# Patient Record
Sex: Female | Born: 1950 | Race: White | Hispanic: No | Smoking: Never smoker
Health system: Southern US, Community
[De-identification: ages and names within clinical notes are randomized; demographics above are authoritative.]

## PROBLEM LIST (undated history)

## (undated) DIAGNOSIS — M25473 Effusion, unspecified ankle: Secondary | ICD-10-CM

## (undated) DIAGNOSIS — F419 Anxiety disorder, unspecified: Secondary | ICD-10-CM

## (undated) DIAGNOSIS — M722 Plantar fascial fibromatosis: Secondary | ICD-10-CM

## (undated) DIAGNOSIS — L719 Rosacea, unspecified: Secondary | ICD-10-CM

## (undated) DIAGNOSIS — E119 Type 2 diabetes mellitus without complications: Secondary | ICD-10-CM

## (undated) DIAGNOSIS — M7989 Other specified soft tissue disorders: Secondary | ICD-10-CM

## (undated) DIAGNOSIS — I10 Essential (primary) hypertension: Secondary | ICD-10-CM

## (undated) DIAGNOSIS — I4891 Unspecified atrial fibrillation: Secondary | ICD-10-CM

## (undated) DIAGNOSIS — I82409 Acute embolism and thrombosis of unspecified deep veins of unspecified lower extremity: Secondary | ICD-10-CM

## (undated) DIAGNOSIS — C801 Malignant (primary) neoplasm, unspecified: Secondary | ICD-10-CM

## (undated) DIAGNOSIS — R7303 Prediabetes: Secondary | ICD-10-CM

## (undated) DIAGNOSIS — Z86711 Personal history of pulmonary embolism: Secondary | ICD-10-CM

## (undated) HISTORY — PX: ABDOMINAL HYSTERECTOMY: SHX81

## (undated) HISTORY — PX: FRACTURE SURGERY: SHX138

## (undated) HISTORY — PX: COLONOSCOPY: SHX174

## (undated) HISTORY — PX: ACHILLES TENDON SURGERY: SHX542

## (undated) HISTORY — PX: CHOLECYSTECTOMY: SHX55

---

## 1999-04-01 ENCOUNTER — Other Ambulatory Visit: Admission: RE | Admit: 1999-04-01 | Discharge: 1999-04-01 | Payer: Self-pay | Admitting: *Deleted

## 1999-04-10 ENCOUNTER — Encounter (INDEPENDENT_AMBULATORY_CARE_PROVIDER_SITE_OTHER): Payer: Self-pay | Admitting: Specialist

## 1999-04-10 ENCOUNTER — Other Ambulatory Visit: Admission: RE | Admit: 1999-04-10 | Discharge: 1999-04-10 | Payer: Self-pay | Admitting: *Deleted

## 2004-09-24 ENCOUNTER — Other Ambulatory Visit: Admission: RE | Admit: 2004-09-24 | Discharge: 2004-09-24 | Payer: Self-pay | Admitting: Obstetrics and Gynecology

## 2009-10-27 ENCOUNTER — Ambulatory Visit: Payer: Self-pay | Admitting: Vascular Surgery

## 2009-11-20 ENCOUNTER — Ambulatory Visit: Payer: Self-pay | Admitting: Vascular Surgery

## 2010-09-13 ENCOUNTER — Encounter: Payer: Self-pay | Admitting: Obstetrics and Gynecology

## 2011-01-05 NOTE — Procedures (Signed)
LOWER EXTREMITY VENOUS REFLUX EXAM   INDICATION:  Edema and spider veins.   EXAM:  Using color-flow imaging and pulse Doppler spectral analysis, the  bilateral common femoral, superficial femoral, popliteal, posterior  tibial, greater and lesser saphenous veins are evaluated.  There is no  evidence suggesting deep venous insufficiency in the bilateral lower  extremities.   The bilateral saphenofemoral junction is competent. The right GSV is not  competent with Reflux of >547milliseconds with the caliber as described  below.   The bilateral proximal short saphenous veins demonstrate competency.   GSV Diameter (used if found to be incompetent only)                                            Right       Left  Proximal Greater Saphenous Vein           0.43 cm     cm  Proximal-to-mid-thigh                     0.50 cm     cm  Mid thigh                                 0.41 cm     cm  Mid-distal thigh                          cm          cm  Distal thigh                              0.38 cm     cm  Knee                                      0.31 cm     cm   IMPRESSION:  1. Right greater saphenous vein Reflux with >538milliseconds is      identified with the caliber ranging from 0.31 cm to 0.43 cm knee to      groin.  2. The right greater saphenous vein is not aneurysmal.  3. The right greater saphenous vein is not tortuous.  4. The deep venous system is competent.  5. The bilateral lesser saphenous veins are competent.   ___________________________________________  Quita Skye. Hart Rochester, M.D.   CB/MEDQ  D:  10/27/2009  T:  10/28/2009  Job:  [id number]

## 2011-01-05 NOTE — Consult Note (Signed)
NEW PATIENT CONSULTATION   Stacie Marquez, Stacie Marquez  DOB:  September 28, 1950                                       10/27/2009  [id number]#:[id number]   Patient is a 60 year old female referred by [facility]  for painful venous disease in both lower extremities and swelling in the  left lower extremity.  She states that over the past 4-5 months, she has  developed some edema in the ankles, left worse than right.  She has  about a 3-4-year history of burning, tingling discomfort on the right  leg in the lateral knee and lateral calf area associated with an  enlarging pattern of prominent veins in these areas.  She also has some  prominent veins in the left leg but not to the same degree.  She had no  history of bleeding, ulcerations, thrombophlebitis, or deep vein  thrombosis.  She has never had treatment of these veins in the past.   CHRONIC MEDICAL PROBLEMS:  1. Hyperlipidemia.  2. Chronic foot pain.  3. Distal edema.  4. Negative for diabetes, hypertension, coronary artery disease, COPD,      or stroke.   PAST SURGICAL HISTORY:  1. Cesarean section.  2. Repair of right Achilles tendon.   FAMILY HISTORY:  Positive for diabetes, coronary artery disease, and  Alzheimer's in her mother.  Peripheral vascular disease in her father  and stroke at an elderly age.   SOCIAL HISTORY:  She is single, has 2 children, works as a Catering manager.  Does not use tobacco or alcohol.   REVIEW OF SYSTEMS:  Negative for anorexia, weight loss, chest pain,  dyspnea on exertion, PND, orthopnea, chronic cough, bronchitis, asthma,  wheezing.  Denies any GI or GU symptoms.  Is able to ambulate to several  blocks without symptoms.  Neurologic, musculoskeletal, all other the  review of systems are negative.   PHYSICAL EXAMINATION:  Blood pressure 142/80, heart rate 72,  respirations 20.  General:  She is a well-developed, well-nourished  female in no apparent distress.  HEENT:  EOMs intact.   Conjunctivae  normal.  Neck is supple, 3+ carotid pulses with no bruits.  Chest:  Clear to auscultation.  No wheezing.  Cardiovascular:  Regular rhythm.  No murmurs.  Abdomen:  Soft, nontender with no masses.  Musculoskeletal:  Free of deformities.  Neurologic:  Normal.  Skin:  Free of rashes.  Lower extremity exam reveals an extensive pattern of reticular and  spider veins in the right leg in the lateral and medial thigh with large  coalescing patterns up to 8 or 10 cm in diameter extending down into the  lateral calf area and a few at the ankle with 1+ edema.  The left leg  has much less in the way of spider and reticular veins in the same  distribution with edema slightly worse than the right.  She has 3+  dorsalis pedis pulses bilaterally.   Today I reviewed the medical information provided by the [facility] as well as I ordered a venous duplex exam in which I  reviewed and interpreted.  Left leg is normal.  Right leg has reflux in  the great saphenous vein, but the diameter of the vein is small, and the  deep system is normal.   I do not think she needs any treatment of her great saphenous vein,  as  it is a small vessel, although it does have some reflux.  I think she  would benefit from sclerotherapy of these extensive patterns of  reticular and spider veins.  We discussed this with her at length today,  including risks and benefits.  She will consider this and make a  decision regarding it.     Quita Skye Hart Rochester, M.D.  Electronically Signed   JDL/MEDQ  D:  10/27/2009  T:  10/27/2009  Job:  [id number]   cc:   Fara Chute

## 2012-05-29 ENCOUNTER — Other Ambulatory Visit: Payer: Self-pay | Admitting: Gastroenterology

## 2012-05-29 DIAGNOSIS — R1011 Right upper quadrant pain: Secondary | ICD-10-CM

## 2012-06-02 NOTE — Procedures (Signed)
°

## 2012-06-05 ENCOUNTER — Ambulatory Visit
Admission: RE | Admit: 2012-06-05 | Discharge: 2012-06-05 | Disposition: A | Discharge status: Home / Self Care | Payer: BC Managed Care – PPO | Source: Ambulatory Visit | Attending: Gastroenterology | Admitting: Gastroenterology

## 2012-06-05 ENCOUNTER — Other Ambulatory Visit: Payer: Self-pay | Admitting: Gastroenterology

## 2012-06-05 DIAGNOSIS — R1011 Right upper quadrant pain: Secondary | ICD-10-CM

## 2012-06-05 NOTE — Procedures (Signed)
°

## 2012-06-15 ENCOUNTER — Encounter (INDEPENDENT_AMBULATORY_CARE_PROVIDER_SITE_OTHER): Payer: Self-pay | Admitting: General Surgery

## 2012-06-15 ENCOUNTER — Ambulatory Visit (INDEPENDENT_AMBULATORY_CARE_PROVIDER_SITE_OTHER): Payer: BC Managed Care – PPO | Admitting: General Surgery

## 2012-06-15 VITALS — BP 119/83 | HR 85 | Temp 97.4°F | Resp 18 | Ht 64.0 in | Wt 237.8 lb

## 2012-06-15 DIAGNOSIS — R1011 Right upper quadrant pain: Secondary | ICD-10-CM

## 2012-06-15 MED ORDER — ESOMEPRAZOLE MAGNESIUM 20 MG PO CPDR: 20.0000 mg | DELAYED_RELEASE_CAPSULE | Freq: Every day | ORAL | Status: DC

## 2012-06-15 NOTE — Progress Notes (Signed)
Chief Complaint  Patient presents with   Abdominal Pain    gallbladder    HISTORY:  Stacie Marquez is a 61 y.o. female who presents to clinic with RUQ pain that is worse at night.  Associated symptoms include bloating.  The pain is worse after pizza, cabbage, beans and hamburgers.  She complains of early satiety.  She describes minimal nausea and no reflux- just pain.  The pain feels better when she puts pressure on the area or stands up.  It is worse when lying down.  History reviewed. No pertinent past medical history.     Past Surgical History  Procedure Date   Cesarean section    Achilles tendon surgery       Current Outpatient Prescriptions  Medication Sig Dispense Refill   diclofenac (VOLTAREN) 75 MG EC tablet Take 75 mg by mouth 2 (two) times daily.       doxycycline (VIBRAMYCIN) 100 MG capsule Take 100 mg by mouth daily.       esomeprazole (NEXIUM) 20 MG capsule Take 1 capsule (20 mg total) by mouth daily before breakfast.  30 capsule  2     No Known Allergies    History reviewed. No pertinent family history.    History   Social History   Marital Status: Single    Spouse Name: N/A    Number of Children: N/A   Years of Education: N/A   Social History Main Topics   Smoking status: Never Smoker    Smokeless tobacco: None   Alcohol Use: No   Drug Use: No   Sexually Active: None   Other Topics Concern   None   Social History Narrative   None       REVIEW OF SYSTEMS - PERTINENT POSITIVES ONLY: Review of Systems - General ROS: negative for - chills, fever, weight gain or weight loss Hematological and Lymphatic ROS: negative for - bleeding problems, blood clots or bruising Respiratory ROS: no cough, shortness of breath, or wheezing Cardiovascular ROS: no chest pain or dyspnea on exertion Gastrointestinal ROS: positive for - abdominal pain and gas/bloating negative for - blood in stools, change in stools or melena  EXAM: Filed Vitals:   06/15/12  1328  BP: 119/83  Pulse: 85  Temp: 97.4 F (36.3 C)  Resp: 18    General appearance: alert, cooperative and no distress Resp: clear to auscultation bilaterally Cardio: regular rate and rhythm GI: obese, soft, no masses Tender in RUQ.  Neg Holman's sign  LABORATORY RESULTS: Available labs are reviewed    RADIOLOGY RESULTS:   Images and reports are reviewed. IMPRESSION:  1. Cholelithiasis without sonographic findings for acute  cholecystitis.  2. Normal caliber common bile duct.  3. Diffuse fatty infiltration of the liver.    ASSESSMENT AND PLAN: Stacie Marquez is a 61 y.o. female who was referred to me for RUQ pain.  She has an [place] that shows cholelithiasis.  Her symptoms of bloating and pain after eating certain meals may be attributed to her gallbladder.  They could also be due to gastritis, so I will prescribe a short course of PPI while we schedule her for surgery.  She will also need medical clearance.  If the PPI were to completely eliminate her pain, we will postpone surgery.  She will need LFT's prior to her procedure as well.      Vanita Panda, MD Colon and Rectal Surgery / General Surgery [facility]      Visit  Diagnoses: 1. RUQ pain     Primary Care Physician: Estanislado Pandy, MD   Dr Andrey Campanile

## 2012-06-15 NOTE — Patient Instructions (Signed)
Start taking your Nexium daily for 2 weeks.  If your symptoms get better before your surgery, call the office and we will cancel.

## 2012-07-13 ENCOUNTER — Encounter (HOSPITAL_COMMUNITY): Payer: Self-pay | Admitting: Pharmacy Technician

## 2012-07-17 ENCOUNTER — Encounter (HOSPITAL_COMMUNITY)
Admission: RE | Admit: 2012-07-17 | Discharge: 2012-07-17 | Disposition: A | Discharge status: Home / Self Care | Payer: BC Managed Care – PPO | Source: Ambulatory Visit | Attending: General Surgery | Admitting: General Surgery

## 2012-07-17 ENCOUNTER — Encounter (HOSPITAL_COMMUNITY): Payer: Self-pay

## 2012-07-17 HISTORY — DX: Rosacea, unspecified: L71.9

## 2012-07-17 LAB — CBC
MCH: 29.6 pg (ref 26.0–34.0)
MCV: 85.9 fL (ref 78.0–100.0)
Platelets: 181 10*3/uL (ref 150–400)
RDW: 13.8 % (ref 11.5–15.5)

## 2012-07-17 NOTE — Pre-Procedure Instructions (Signed)
20 Mckenzy Salazar  07/17/2012   Your procedure is scheduled on:  Thursday December 5  Report to [facility] Stay [facility] at 8:00 AM.  Call this number if you have problems the morning of surgery: [phone]   Remember:   Do not eat or drink:After Midnight.    Take these medicines the morning of surgery with A SIP OF WATER: Doxycycline   Do not wear jewelry, make-up or nail polish.  Do not wear lotions, powders, or perfumes. You may wear deodorant.  Do not shave 48 hours prior to surgery. Men may shave face and neck.  Do not bring valuables to the hospital.  Contacts, dentures or bridgework may not be worn into surgery.  Leave suitcase in the car. After surgery it may be brought to your room.  For patients admitted to the hospital, checkout time is 11:00 AM the day of discharge.   Patients discharged the day of surgery will not be allowed to drive home.    Special Instructions: Shower using CHG 2 nights before surgery and the night before surgery.  If you shower the day of surgery use CHG.  Use special wash - you have one bottle of CHG for all showers.  You should use approximately 1/3 of the bottle for each shower.   Please read over the following fact sheets that you were given: Pain Booklet, Coughing and Deep Breathing and Surgical Site Infection Prevention

## 2012-07-18 NOTE — Procedures (Signed)
°

## 2012-07-26 MED ORDER — DEXTROSE 5 % IV SOLN
2.0000 g | INTRAVENOUS | Status: AC
  Administered 2012-07-27: 2 g via INTRAVENOUS
  Filled 2012-07-26: qty 2

## 2012-07-27 ENCOUNTER — Ambulatory Visit (HOSPITAL_COMMUNITY): Payer: BC Managed Care – PPO | Admitting: Anesthesiology

## 2012-07-27 ENCOUNTER — Encounter (HOSPITAL_COMMUNITY): Payer: Self-pay | Admitting: Anesthesiology

## 2012-07-27 ENCOUNTER — Encounter (HOSPITAL_COMMUNITY)
Admission: RE | Disposition: A | Discharge status: Home / Self Care | Payer: Self-pay | Source: Ambulatory Visit | Attending: General Surgery

## 2012-07-27 ENCOUNTER — Encounter (HOSPITAL_COMMUNITY): Payer: Self-pay | Admitting: *Deleted

## 2012-07-27 ENCOUNTER — Ambulatory Visit (HOSPITAL_COMMUNITY)
Admission: RE | Admit: 2012-07-27 | Discharge: 2012-07-27 | Disposition: A | Discharge status: Home / Self Care | Payer: BC Managed Care – PPO | Source: Ambulatory Visit | Attending: General Surgery | Admitting: General Surgery

## 2012-07-27 ENCOUNTER — Ambulatory Visit (HOSPITAL_COMMUNITY): Payer: BC Managed Care – PPO

## 2012-07-27 DIAGNOSIS — K8020 Calculus of gallbladder without cholecystitis without obstruction: Secondary | ICD-10-CM | POA: Insufficient documentation

## 2012-07-27 DIAGNOSIS — Z01812 Encounter for preprocedural laboratory examination: Secondary | ICD-10-CM | POA: Insufficient documentation

## 2012-07-27 DIAGNOSIS — K8010 Calculus of gallbladder with chronic cholecystitis without obstruction: Secondary | ICD-10-CM

## 2012-07-27 HISTORY — PX: CHOLECYSTECTOMY: SHX55

## 2012-07-27 LAB — HEPATIC FUNCTION PANEL
ALT: 21 U/L (ref 0–35)
AST: 19 U/L (ref 0–37)
Albumin: 3.4 g/dL — ABNORMAL LOW (ref 3.5–5.2)
Alkaline Phosphatase: 57 U/L (ref 39–117)
Total Protein: 6.4 g/dL (ref 6.0–8.3)

## 2012-07-27 SURGERY — LAPAROSCOPIC CHOLECYSTECTOMY WITH INTRAOPERATIVE CHOLANGIOGRAM
Anesthesia: General | Site: Abdomen | Wound class: Clean Contaminated

## 2012-07-27 MED ORDER — ONDANSETRON HCL 4 MG/2ML IJ SOLN
INTRAMUSCULAR | Status: DC | PRN
  Administered 2012-07-27: 4 mg via INTRAVENOUS

## 2012-07-27 MED ORDER — GLYCOPYRROLATE 0.2 MG/ML IJ SOLN
INTRAMUSCULAR | Status: DC | PRN
  Administered 2012-07-27: .6 mg via INTRAVENOUS

## 2012-07-27 MED ORDER — LACTATED RINGERS IV SOLN
INTRAVENOUS | Status: DC | PRN
  Administered 2012-07-27 (×2): via INTRAVENOUS

## 2012-07-27 MED ORDER — 0.9 % SODIUM CHLORIDE (POUR BTL) OPTIME
TOPICAL | Status: DC | PRN
  Administered 2012-07-27: 1000 mL

## 2012-07-27 MED ORDER — HYDROMORPHONE HCL PF 1 MG/ML IJ SOLN
0.2500 mg | INTRAMUSCULAR | Status: DC | PRN
  Administered 2012-07-27: 0.5 mg via INTRAVENOUS

## 2012-07-27 MED ORDER — MIDAZOLAM HCL 5 MG/5ML IJ SOLN
INTRAMUSCULAR | Status: DC | PRN
  Administered 2012-07-27: 2 mg via INTRAVENOUS

## 2012-07-27 MED ORDER — HYDROMORPHONE HCL PF 1 MG/ML IJ SOLN
INTRAMUSCULAR | Status: AC
  Filled 2012-07-27: qty 1

## 2012-07-27 MED ORDER — ROCURONIUM BROMIDE 100 MG/10ML IV SOLN
INTRAVENOUS | Status: DC | PRN
  Administered 2012-07-27: 50 mg via INTRAVENOUS

## 2012-07-27 MED ORDER — NEOSTIGMINE METHYLSULFATE 1 MG/ML IJ SOLN
INTRAMUSCULAR | Status: DC | PRN
  Administered 2012-07-27: 4 mg via INTRAVENOUS

## 2012-07-27 MED ORDER — OXYCODONE-ACETAMINOPHEN 5-325 MG PO TABS: 1.0000 | ORAL_TABLET | ORAL | Status: DC | PRN

## 2012-07-27 MED ORDER — DEXAMETHASONE SODIUM PHOSPHATE 4 MG/ML IJ SOLN
INTRAMUSCULAR | Status: DC | PRN
  Administered 2012-07-27: 4 mg via INTRAVENOUS

## 2012-07-27 MED ORDER — BUPIVACAINE-EPINEPHRINE 0.25% -1:200000 IJ SOLN
INTRAMUSCULAR | Status: DC | PRN
  Administered 2012-07-27: 16 mL

## 2012-07-27 MED ORDER — FENTANYL CITRATE 0.05 MG/ML IJ SOLN
INTRAMUSCULAR | Status: DC | PRN
  Administered 2012-07-27 (×2): 50 ug via INTRAVENOUS

## 2012-07-27 MED ORDER — SODIUM CHLORIDE 0.9 % IR SOLN
Status: DC | PRN
  Administered 2012-07-27: 1000 mL

## 2012-07-27 MED ORDER — OXYCODONE HCL 5 MG PO TABS: 5.0000 mg | ORAL_TABLET | Freq: Once | ORAL | Status: DC | PRN

## 2012-07-27 MED ORDER — EPHEDRINE SULFATE 50 MG/ML IJ SOLN
INTRAMUSCULAR | Status: DC | PRN
  Administered 2012-07-27: 15 mg via INTRAVENOUS

## 2012-07-27 MED ORDER — OXYCODONE HCL 5 MG/5ML PO SOLN: 5.0000 mg | Freq: Once | ORAL | Status: DC | PRN

## 2012-07-27 MED ORDER — LACTATED RINGERS IV SOLN
INTRAVENOUS | Status: DC
  Administered 2012-07-27: 10:00:00 via INTRAVENOUS

## 2012-07-27 MED ORDER — SODIUM CHLORIDE 0.9 % IV SOLN
INTRAVENOUS | Status: DC | PRN
  Administered 2012-07-27: 11:00:00

## 2012-07-27 MED ORDER — LIDOCAINE HCL (CARDIAC) 20 MG/ML IV SOLN
INTRAVENOUS | Status: DC | PRN
  Administered 2012-07-27: 100 mg via INTRAVENOUS

## 2012-07-27 MED ORDER — PROPOFOL 10 MG/ML IV BOLUS
INTRAVENOUS | Status: DC | PRN
  Administered 2012-07-27: 180 mg via INTRAVENOUS

## 2012-07-27 MED ORDER — ONDANSETRON HCL 4 MG/2ML IJ SOLN: 4.0000 mg | Freq: Four times a day (QID) | INTRAMUSCULAR | Status: DC | PRN

## 2012-07-27 MED ORDER — BUPIVACAINE-EPINEPHRINE PF 0.25-1:200000 % IJ SOLN
INTRAMUSCULAR | Status: AC
  Filled 2012-07-27: qty 30

## 2012-07-27 SURGICAL SUPPLY — 50 items
ADH SKN CLS APL DERMABOND .7 (GAUZE/BANDAGES/DRESSINGS) ×1
APPLIER CLIP 5 13 M/L LIGAMAX5 (MISCELLANEOUS) ×2
APR CLP MED LRG 5 ANG JAW (MISCELLANEOUS) ×1
BAG SPEC RTRVL LRG 6X4 10 (ENDOMECHANICALS) ×1
CANISTER SUCTION 2500CC (MISCELLANEOUS) ×2 IMPLANT
CATH REDDICK CHOLANGI 4FR 50CM (CATHETERS) ×2 IMPLANT
CHLORAPREP W/TINT 26ML (MISCELLANEOUS) ×2 IMPLANT
CLIP APPLIE 5 13 M/L LIGAMAX5 (MISCELLANEOUS) ×1 IMPLANT
CLOTH BEACON ORANGE TIMEOUT ST (SAFETY) ×2 IMPLANT
COVER MAYO STAND STRL (DRAPES) ×2 IMPLANT
COVER SURGICAL LIGHT HANDLE (MISCELLANEOUS) ×2 IMPLANT
DECANTER SPIKE VIAL GLASS SM (MISCELLANEOUS) ×4 IMPLANT
DERMABOND ADVANCED (GAUZE/BANDAGES/DRESSINGS) ×1
DERMABOND ADVANCED .7 DNX12 (GAUZE/BANDAGES/DRESSINGS) ×1 IMPLANT
DRAPE C-ARM 42X72 X-RAY (DRAPES) ×2 IMPLANT
DRAPE UTILITY 15X26 W/TAPE STR (DRAPE) ×4 IMPLANT
ELECT REM PT RETURN 9FT ADLT (ELECTROSURGICAL) ×2
ELECTRODE REM PT RTRN 9FT ADLT (ELECTROSURGICAL) ×1 IMPLANT
GLOVE BIO SURGEON STRL SZ 6.5 (GLOVE) ×2 IMPLANT
GLOVE BIO SURGEON STRL SZ7 (GLOVE) ×2 IMPLANT
GLOVE BIO SURGEON STRL SZ7.5 (GLOVE) ×2 IMPLANT
GLOVE BIOGEL PI IND STRL 7.0 (GLOVE) ×3 IMPLANT
GLOVE BIOGEL PI IND STRL 7.5 (GLOVE) ×1 IMPLANT
GLOVE BIOGEL PI IND STRL 8 (GLOVE) ×1 IMPLANT
GLOVE BIOGEL PI INDICATOR 7.0 (GLOVE) ×3
GLOVE BIOGEL PI INDICATOR 7.5 (GLOVE) ×1
GLOVE BIOGEL PI INDICATOR 8 (GLOVE) ×1
GLOVE SS BIOGEL STRL SZ 6.5 (GLOVE) ×1 IMPLANT
GLOVE SUPERSENSE BIOGEL SZ 6.5 (GLOVE) ×1
GOWN PREVENTION PLUS XXLARGE (GOWN DISPOSABLE) ×2 IMPLANT
GOWN STRL NON-REIN LRG LVL3 (GOWN DISPOSABLE) ×6 IMPLANT
IV CATH 14GX2 1/4 (CATHETERS) ×2 IMPLANT
KIT BASIN OR (CUSTOM PROCEDURE TRAY) ×2 IMPLANT
KIT ROOM TURNOVER OR (KITS) ×2 IMPLANT
NS IRRIG 1000ML POUR BTL (IV SOLUTION) ×2 IMPLANT
PAD ARMBOARD 7.5X6 YLW CONV (MISCELLANEOUS) ×2 IMPLANT
POUCH SPECIMEN RETRIEVAL 10MM (ENDOMECHANICALS) ×2 IMPLANT
SCISSORS LAP 5X35 DISP (ENDOMECHANICALS) ×2 IMPLANT
SET IRRIG TUBING LAPAROSCOPIC (IRRIGATION / IRRIGATOR) ×2 IMPLANT
SLEEVE ENDOPATH XCEL 5M (ENDOMECHANICALS) ×4 IMPLANT
SPECIMEN JAR SMALL (MISCELLANEOUS) ×2 IMPLANT
SUT VIC AB 2-0 SH 27 (SUTURE) ×2
SUT VIC AB 2-0 SH 27XBRD (SUTURE) ×1 IMPLANT
SUT VIC AB 4-0 PS2 27 (SUTURE) ×2 IMPLANT
SUT VICRYL 0 UR6 27IN ABS (SUTURE) ×2 IMPLANT
TOWEL OR 17X24 6PK STRL BLUE (TOWEL DISPOSABLE) ×2 IMPLANT
TOWEL OR 17X26 10 PK STRL BLUE (TOWEL DISPOSABLE) ×2 IMPLANT
TRAY LAPAROSCOPIC (CUSTOM PROCEDURE TRAY) ×2 IMPLANT
TROCAR XCEL BLUNT TIP 100MML (ENDOMECHANICALS) ×2 IMPLANT
TROCAR XCEL NON-BLD 5MMX100MML (ENDOMECHANICALS) ×2 IMPLANT

## 2012-07-27 NOTE — Op Note (Signed)
07/27/2012  11:30 AM  PATIENT:  Stacie Marquez  61 y.o. female  Patient Care Team: Estanislado Pandy, MD as PCP - General (Cardiology)  PRE-OPERATIVE DIAGNOSIS:  Symptomatic cholelithiasis  POST-OPERATIVE DIAGNOSIS:  symptomatic cholelithiasis  PROCEDURE:  Procedure(s): LAPAROSCOPIC CHOLECYSTECTOMY WITH INTRAOPERATIVE CHOLANGIOGRAM  SURGEON:  Surgeon(s): Romie Levee, MD Wilmon Arms. Corliss Skains, MD  ASSISTANT: Tsuei   ANESTHESIA:   local and general  EBL: 20ml Total I/O In: 1200 [I.V.:1200] Out: -   SPECIMEN:  Source of Specimen:  Gallbladder  DISPOSITION OF SPECIMEN:  PATHOLOGY  COUNTS:  YES  PLAN OF CARE: Discharge to home after PACU  PATIENT DISPOSITION:  PACU - hemodynamically stable.  INDICATION: This is a 61yo F with symptomatic cholithiasis.  The anatomy & physiology of hepatobiliary & pancreatic function was discussed.  The pathophysiology of gallbladder dysfunction was discussed.  Natural history risks without surgery was discussed.   I feel the risks of no intervention will lead to serious problems that outweigh the operative risks; therefore, I recommended cholecystectomy to remove the pathology.  I explained laparoscopic techniques with possible need for an open approach.  Probable cholangiogram to evaluate the bilary tract was explained as well.    Risks such as bleeding, infection, abscess, leak, injury to other organs, need for further treatment, heart attack, death, and other risks were discussed.  I noted a good likelihood this will help address the problem.  Possibility that this will not correct all abdominal symptoms was explained.  Goals of post-operative recovery were discussed as well.    OR FINDINGS: distended Gallbladder  DESCRIPTION:   The patient was identified & brought into the operating room. The patient was positioned supine with arms tucked. SCDs were active during the entire case. The patient underwent general anesthesia without any difficulty.  The  abdomen was prepped and draped in a sterile fashion. A Surgical Timeout was performed and confirmed our plan.  We positioned the patient in reverse [place] & right side up.  I placed a Hassan laparoscopic port through the umbilicus using open entry technique.  Entry was clean. There were no adhesions to the anterior abdominal wall supraumbilically.  We induced carbon dioxide insufflation. Camera inspection revealed no injury.    I proceeded to continue with laparoscopic technique. I placed a #5 port in mid subcostal region, another 5mm port in the right flank near the anterior axillary line, and a 5mm port in the left subxiphoid region obliquely within the falciform ligament.  I turned attention to the right upper quadrant.  The duodenum was slightly adherent to the neck of the gallbladder.  This was taken down sharply.  The gallbladder fundus was elevated cephalad. I used cautery and blunt dissection to free the peritoneal coverings between the gallbladder and the liver on the posteriolateral and anteriomedial walls.   I used careful blunt and cautery dissection with a maryland dissector to help get a good critical view of the cystic artery and cystic duct. I did further dissection to free a few centimeters of the  gallbladder off the liver bed to get a good critical view of the infundibulum and cystic duct. I mobilized the cystic artery.  I skeletonized the cystic duct.  After getting a good 360 view, I decided to perform a cholangiogram.  I placed a clip on the infundibulum.   I did a partial cystic duct-otomy and ensured patency. I placed a 5 [place] Reddick balloon cholangiocatheter through a puncture site at the right subcostal ridge of the abdominal  wall and directed it into the cystic duct.  We ran a cholangiogram with dilute radio-opaque contrast and continuous fluoroscopy.  Contrast flowed from a side branch consistent with cystic duct cannulization. Contrast flowed up the common hepatic duct  into the right and left intrahepatic chains out to secondary radicals. Contrast flowed down the common bile duct easily across the normal ampulla into the duodenum.  This was consistent with a normal cholangiogram.  I removed the cholangiocatheter.  I placed clips on the cystic duct x3.  I completed cystic duct transection.   I placed clips on the cystic artery x3 with 2 proximally.  I ligated the cystic artery using scissors. I freed the gallbladder from its remaining attachments to the liver. I ensured hemostasis on the gallbladder fossa of the liver and elsewhere. I inspected the rest of the abdomen & detected no injury nor bleeding elsewhere.  I irrigated the RUQ with normal saline.  I removed the gallbladder through the umbilical port site.  I closed the umbilical fascia using 0 Vicryl stitches x2.   I closed the skin using 4-0 vicryl stitch.  Sterile dressings were applied. The patient was extubated & arrived in the PACU in stable condition.  I had discussed postoperative care with the patient in the holding area.   I will discuss  operative findings and postoperative goals / instructions with the patient's family.  Instructions are written in the chart as well.

## 2012-07-27 NOTE — Progress Notes (Signed)
1610  Hepatic function sample sent to the lab STAT as per requested by surgeon.Marland KitchenMarland KitchenDA

## 2012-07-27 NOTE — Anesthesia Postprocedure Evaluation (Signed)
Anesthesia Post Note  Patient: Stacie Marquez  Procedure(s) Performed: Procedure(s) (LRB): LAPAROSCOPIC CHOLECYSTECTOMY WITH INTRAOPERATIVE CHOLANGIOGRAM (N/A)  Anesthesia type: General  Patient location: PACU  Post pain: Pain level controlled and Adequate analgesia  Post assessment: Post-op Vital signs reviewed, Patient's Cardiovascular Status Stable, Respiratory Function Stable, Patent Airway and Pain level controlled  Last Vitals:  Filed Vitals:   07/27/12 1345  BP:   Pulse:   Temp: 36.8 C  Resp:     Post vital signs: Reviewed and stable  Level of consciousness: awake, alert  and oriented  Complications: No apparent anesthesia complications

## 2012-07-27 NOTE — H&P (Signed)
Chief Complaint   Patient presents with     Abdominal Pain     gallbladder   HISTORY: Stacie Marquez is a 61 y.o. female who presents to clinic with RUQ pain that is worse at night. Associated symptoms include bloating. The pain is worse after pizza, cabbage, beans and hamburgers. She complains of early satiety. She describes minimal nausea and no reflux- just pain. The pain feels better when she puts pressure on the area or stands up. It is worse when lying down.  History reviewed. No pertinent past medical history.  Past Surgical History   Procedure  Date     Cesarean section      Achilles tendon surgery     Current Outpatient Prescriptions   Medication  Sig  Dispense  Refill     diclofenac (VOLTAREN) 75 MG EC tablet  Take 75 mg by mouth 2 (two) times daily.       doxycycline (VIBRAMYCIN) 100 MG capsule  Take 100 mg by mouth daily.       esomeprazole (NEXIUM) 20 MG capsule  Take 1 capsule (20 mg total) by mouth daily before breakfast.  30 capsule  2   No Known Allergies  History reviewed. No pertinent family history.  History    Social History     Marital Status:  Single     Spouse Name:  N/A     Number of Children:  N/A     Years of Education:  N/A    Social History Main Topics     Smoking status:  Never Smoker     Smokeless tobacco:  None     Alcohol Use:  No     Drug Use:  No     Sexually Active:  None    Other Topics  Concern     None    Social History Narrative     None    REVIEW OF SYSTEMS - PERTINENT POSITIVES ONLY:  Review of Systems - General ROS: negative for - chills, fever, weight gain or weight loss  Hematological and Lymphatic ROS: negative for - bleeding problems, blood clots or bruising  Respiratory ROS: no cough, shortness of breath, or wheezing  Cardiovascular ROS: no chest pain or dyspnea on exertion  Gastrointestinal ROS: positive for - abdominal pain and gas/bloating  negative for - blood in stools, change in stools or melena    EXAM:  Filed Vitals:   07/27/12 0758  BP: 148/85  Pulse: 52  Temp: 98.5 F (36.9 C)  Resp: 18   General appearance: alert, cooperative and no distress  Resp: clear to auscultation bilaterally  Cardio: regular rate and rhythm  GI: obese, soft, no masses  Tender in RUQ.    LABORATORY RESULTS:  Available labs are reviewed  Lab Results  Component Value Date   WBC 4.8 07/17/2012   HGB 14.5 07/17/2012   HCT 42.1 07/17/2012   MCV 85.9 07/17/2012   PLT 181 07/17/2012   LFT's pending  RADIOLOGY RESULTS:  Images and reports are reviewed.  RUQ US IMPRESSION:  1. Cholelithiasis without sonographic findings for acute cholecystitis.  2. Normal caliber common bile duct.  3. Diffuse fatty infiltration of the liver.   ASSESSMENT AND PLAN:  Stacie Marquez is a 61 y.o. female who was referred to me for RUQ pain. She has an [place] that shows cholelithiasis. Her symptoms of bloating and pain after eating certain meals may be attributed to her gallbladder.  To OR for lap cholecystectomy and IOC.  The anatomy & physiology of hepatobiliary & pancreatic function was discussed.  The pathophysiology of gallbladder dysfunction was discussed.  Natural history risks without surgery was discussed.   I feel the risks of no intervention will lead to serious problems that outweigh the operative risks; therefore, I recommended cholecystectomy to remove the pathology.  I explained laparoscopic techniques with possible need for an open approach.  Probable cholangiogram to evaluate the bilary tract was explained as well.    Risks such as bleeding, infection, abscess, leak, injury to other organs, need for further treatment, heart attack, death, and other risks were discussed.  I noted a good likelihood this will help address the problem.  Possibility that this will not correct all abdominal symptoms was explained.  Goals of post-operative recovery were discussed as well.  We will work to minimize complications.  An  educational handout further explaining the pathology and treatment options was given as well.  Questions were answered.  The patient expresses understanding & wishes to proceed with surgery.   Vanita Panda, MD  Colon and Rectal Surgery / General Surgery  [facility]

## 2012-07-27 NOTE — Progress Notes (Addendum)
OOB TO BR UPON ARRIVAL TO SHORT STAY.  VOIDED WITHOUT  PROBLEMS.  ALL WOUNDS CD AND I WITH SKIN GLUE TO COVER X 4 WOUNDS.   TAKING PO FLUIDS.    TAKING LIQUIDS AND CRACKERS PO .Marland KitchenWITHOUT NAUSEA .Marland Kitchen SLEEPING .Marland Kitchen FAMILY AT THE BEDSIDE.

## 2012-07-27 NOTE — Anesthesia Preprocedure Evaluation (Signed)
Anesthesia Evaluation  Patient identified by MRN, date of birth, ID band Patient awake    Reviewed: Allergy & Precautions, H&P , NPO status , Patient's Chart, lab work & pertinent test results  Airway Mallampati: II  Neck ROM: full    Dental   Pulmonary          Cardiovascular     Neuro/Psych    GI/Hepatic   Endo/Other  Morbid obesity  Renal/GU      Musculoskeletal   Abdominal   Peds  Hematology   Anesthesia Other Findings   Reproductive/Obstetrics                           Anesthesia Physical Anesthesia Plan  ASA: II  Anesthesia Plan: General   Post-op Pain Management:    Induction: Intravenous  Airway Management Planned: Oral ETT  Additional Equipment:   Intra-op Plan:   Post-operative Plan: Extubation in OR  Informed Consent: I have reviewed the patients History and Physical, chart, labs and discussed the procedure including the risks, benefits and alternatives for the proposed anesthesia with the patient or authorized representative who has indicated his/her understanding and acceptance.     Plan Discussed with: CRNA and Surgeon  Anesthesia Plan Comments:         Anesthesia Quick Evaluation

## 2012-07-27 NOTE — Transfer of Care (Signed)
Immediate Anesthesia Transfer of Care Note  Patient: Stacie Marquez  Procedure(s) Performed: Procedure(s) (LRB) with comments: LAPAROSCOPIC CHOLECYSTECTOMY WITH INTRAOPERATIVE CHOLANGIOGRAM (N/A)  Patient Location: PACU  Anesthesia Type:General  Level of Consciousness: awake, alert  and oriented  Airway & Oxygen Therapy: Patient Spontanous Breathing and Patient connected to nasal cannula oxygen  Post-op Assessment: Report given to PACU RN, Post -op Vital signs reviewed and stable and Patient moving all extremities X 4  Post vital signs: Reviewed and stable  Complications: No apparent anesthesia complications

## 2012-07-28 ENCOUNTER — Encounter (HOSPITAL_COMMUNITY): Payer: Self-pay | Admitting: General Surgery

## 2012-07-28 NOTE — Procedures (Signed)
°

## 2012-07-29 NOTE — Consent Form (Signed)
°

## 2012-07-29 NOTE — Patient Instructions (Signed)
°

## 2012-07-29 NOTE — Patient Instructions (Deleted)
°

## 2012-08-03 ENCOUNTER — Telehealth (INDEPENDENT_AMBULATORY_CARE_PROVIDER_SITE_OTHER): Payer: Self-pay

## 2012-08-03 NOTE — Telephone Encounter (Signed)
Message copied by Ivory Broad on Thu Aug 03, 2012  9:14 AM ------      Message from: Stacie Marquez      Created: Thu Aug 03, 2012  8:33 AM       Pt would like a call back..she wants to know if she has to wait until her po apt before she can go back to work her # is [phone]

## 2012-08-03 NOTE — Telephone Encounter (Signed)
I called the pt back.  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  I told her it is fine for her to go back now.  She does not need a note because she can use her sick days if she goes back now.  She asked about a hernia repair that Dr Maisie Fus did during surgery.  She said she hadn't talked about it.  I read the op note and told her I saw no record of it.  They can talk about it at her postop visit.

## 2012-08-14 ENCOUNTER — Ambulatory Visit (INDEPENDENT_AMBULATORY_CARE_PROVIDER_SITE_OTHER): Payer: BC Managed Care – PPO | Admitting: General Surgery

## 2012-08-14 ENCOUNTER — Encounter (INDEPENDENT_AMBULATORY_CARE_PROVIDER_SITE_OTHER): Payer: Self-pay | Admitting: General Surgery

## 2012-08-14 VITALS — BP 134/76 | HR 76 | Temp 97.8°F | Resp 18 | Ht 66.0 in | Wt 235.0 lb

## 2012-08-14 DIAGNOSIS — Z9089 Acquired absence of other organs: Secondary | ICD-10-CM

## 2012-08-14 DIAGNOSIS — Z9049 Acquired absence of other specified parts of digestive tract: Secondary | ICD-10-CM

## 2012-08-14 NOTE — Patient Instructions (Signed)
Follow up as needed  No heavy lifting for 8 weeks after surgery

## 2012-08-14 NOTE — Progress Notes (Signed)
Stacie Marquez is a 61 y.o. female who is 2 weeks status post a cholecystectomy.  She is doing well.  Her pain is better.  She was having some lower back pain but that has improved. Objective: Filed Vitals:   08/14/12 1439  BP: 134/76  Pulse: 76  Temp: 97.8 F (36.6 C)  Resp: 18    General appearance: alert and cooperative GI: normal findings: soft, non-tender  Incision: healing well   Assessment: s/p  There is no problem list on file for this patient.   Doing well  Plan: No heavy lifting for 6 more weeks Return to office PRN   .Vanita Panda, MD [facility], [place] [phone]   08/14/2012 3:27 PM

## 2012-08-29 ENCOUNTER — Encounter (INDEPENDENT_AMBULATORY_CARE_PROVIDER_SITE_OTHER): Payer: Self-pay | Admitting: General Surgery

## 2012-08-29 ENCOUNTER — Ambulatory Visit (INDEPENDENT_AMBULATORY_CARE_PROVIDER_SITE_OTHER): Payer: BC Managed Care – PPO | Admitting: General Surgery

## 2012-08-29 VITALS — BP 120/82 | HR 60 | Temp 97.8°F | Resp 12 | Ht 64.0 in | Wt 237.0 lb

## 2012-08-29 DIAGNOSIS — T8140XA Infection following a procedure, unspecified, initial encounter: Secondary | ICD-10-CM

## 2012-08-29 DIAGNOSIS — T8141XA Infection following a procedure, superficial incisional surgical site, initial encounter: Secondary | ICD-10-CM

## 2012-08-29 NOTE — Progress Notes (Signed)
Stacie Marquez is a 62 y.o. female who is status post a lap chole in early Dec.  She returns with increased draina  Objective: Filed Vitals:   08/29/12 1207  BP: 120/82  Pulse: 60  Temp: 97.8 F (36.6 C)  Resp: 12    General appearance: alert and cooperative GI: normal findings: soft, non-tender  Incision: erythema at the superior aspect of incision with thick drainage  Area infused with lidocaine and skin opened with 11 blade.  Stitch knot removed.  Area covered with clean dressing Assessment: s/p  There is no problem list on file for this patient.   Stitch abscess  Plan: Clean and cover daily.  RTO in 2 weeks    .Vanita Panda, MD [facility], [place] [phone]   08/29/2012 12:30 PM

## 2012-08-29 NOTE — Patient Instructions (Signed)
Keep the area clean and covered until the drainage has stopped.

## 2012-09-13 ENCOUNTER — Encounter (INDEPENDENT_AMBULATORY_CARE_PROVIDER_SITE_OTHER): Payer: Self-pay | Admitting: General Surgery

## 2012-09-13 ENCOUNTER — Ambulatory Visit (INDEPENDENT_AMBULATORY_CARE_PROVIDER_SITE_OTHER): Payer: BC Managed Care – PPO | Admitting: General Surgery

## 2012-09-13 VITALS — BP 138/82 | HR 74 | Temp 97.8°F | Resp 16 | Ht 64.0 in | Wt 235.4 lb

## 2012-09-13 DIAGNOSIS — Z5189 Encounter for other specified aftercare: Secondary | ICD-10-CM

## 2012-09-13 NOTE — Progress Notes (Signed)
Stacie Marquez is a 62 y.o. female who is status post a lap chole.  We opened a small stitch abscess about 2 weeks ago.  The area has healed and is not draining anymore.  She has min pain  Objective: Filed Vitals:   09/13/12 1521  BP: 138/82  Pulse: 74  Temp: 97.8 F (36.6 C)  Resp: 16    General appearance: alert and cooperative  Incision: healing well   Assessment: s/p  There is no problem list on file for this patient.  Doing well.    Plan: F/U PRN    .Vanita Panda, MD [facility], [place] [phone]   09/13/2012 3:37 PM

## 2012-09-13 NOTE — Patient Instructions (Signed)
Return to the office as needed

## 2013-06-13 ENCOUNTER — Other Ambulatory Visit: Payer: Self-pay | Admitting: Obstetrics and Gynecology

## 2013-06-19 ENCOUNTER — Other Ambulatory Visit: Payer: Self-pay | Admitting: Obstetrics and Gynecology

## 2013-06-19 DIAGNOSIS — R928 Other abnormal and inconclusive findings on diagnostic imaging of breast: Secondary | ICD-10-CM

## 2013-06-21 NOTE — Procedures (Signed)
°

## 2013-07-09 ENCOUNTER — Ambulatory Visit
Admission: RE | Admit: 2013-07-09 | Discharge: 2013-07-09 | Disposition: A | Discharge status: Home / Self Care | Payer: BC Managed Care – PPO | Source: Ambulatory Visit | Attending: Obstetrics and Gynecology | Admitting: Obstetrics and Gynecology

## 2013-07-09 DIAGNOSIS — R928 Other abnormal and inconclusive findings on diagnostic imaging of breast: Secondary | ICD-10-CM

## 2014-06-08 ENCOUNTER — Emergency Department (HOSPITAL_COMMUNITY)
Admission: EM | Admit: 2014-06-08 | Discharge: 2014-06-08 | Disposition: A | Discharge status: Home / Self Care | Payer: BC Managed Care – PPO | Attending: Emergency Medicine | Admitting: Emergency Medicine

## 2014-06-08 ENCOUNTER — Encounter (HOSPITAL_COMMUNITY): Payer: Self-pay | Admitting: Emergency Medicine

## 2014-06-08 ENCOUNTER — Emergency Department (HOSPITAL_COMMUNITY): Payer: BC Managed Care – PPO

## 2014-06-08 DIAGNOSIS — Y92099 Unspecified place in other non-institutional residence as the place of occurrence of the external cause: Secondary | ICD-10-CM | POA: Diagnosis not present

## 2014-06-08 DIAGNOSIS — Z880 Allergy status to penicillin: Secondary | ICD-10-CM | POA: Insufficient documentation

## 2014-06-08 DIAGNOSIS — Z791 Long term (current) use of non-steroidal anti-inflammatories (NSAID): Secondary | ICD-10-CM | POA: Diagnosis not present

## 2014-06-08 DIAGNOSIS — Y9389 Activity, other specified: Secondary | ICD-10-CM | POA: Diagnosis not present

## 2014-06-08 DIAGNOSIS — W1830XA Fall on same level, unspecified, initial encounter: Secondary | ICD-10-CM | POA: Diagnosis not present

## 2014-06-08 DIAGNOSIS — Z872 Personal history of diseases of the skin and subcutaneous tissue: Secondary | ICD-10-CM | POA: Insufficient documentation

## 2014-06-08 DIAGNOSIS — S42262A Displaced fracture of lesser tuberosity of left humerus, initial encounter for closed fracture: Secondary | ICD-10-CM | POA: Diagnosis not present

## 2014-06-08 DIAGNOSIS — Z792 Long term (current) use of antibiotics: Secondary | ICD-10-CM | POA: Diagnosis not present

## 2014-06-08 DIAGNOSIS — S42202A Unspecified fracture of upper end of left humerus, initial encounter for closed fracture: Secondary | ICD-10-CM

## 2014-06-08 DIAGNOSIS — S42212A Unspecified displaced fracture of surgical neck of left humerus, initial encounter for closed fracture: Secondary | ICD-10-CM | POA: Insufficient documentation

## 2014-06-08 DIAGNOSIS — W19XXXA Unspecified fall, initial encounter: Secondary | ICD-10-CM

## 2014-06-08 DIAGNOSIS — S4992XA Unspecified injury of left shoulder and upper arm, initial encounter: Secondary | ICD-10-CM | POA: Diagnosis present

## 2014-06-08 MED ORDER — HYDROCODONE-ACETAMINOPHEN 5-325 MG PO TABS: 1.0000 | ORAL_TABLET | ORAL | Status: DC | PRN

## 2014-06-08 NOTE — ED Provider Notes (Signed)
CSN: [id number]     Arrival date & time 06/08/14  2106 History  This chart was scribed for Orpah Greek, * by Jeanell Sparrow, ED Scribe. This patient was seen in room APA07/APA07 and the patient's care was started at 9:19 PM.   Chief Complaint  Patient presents with   Arm Injury   The history is provided by the patient. No language interpreter was used.   HPI Comments: Stacie Marquez is a 63 y.o. female who presents to the Emergency Department complaining of a left arm injury that occurred about 45 minutes ago. She reports that she tripped and fell and landed in gravel on her left arm. She denies any LOC. She states that she has associated constant moderate pain between her elbow and shoulder.    Past Medical History  Diagnosis Date   Rosacea    Past Surgical History  Procedure Laterality Date   Cesarean section     Achilles tendon surgery     Cholecystectomy  07/27/2012    Procedure: LAPAROSCOPIC CHOLECYSTECTOMY WITH INTRAOPERATIVE CHOLANGIOGRAM;  Surgeon: Leighton Ruff, MD;  Location: [place];  Service: General;  Laterality: N/A;   No family history on file. History  Substance Use Topics   Smoking status: Never Smoker    Smokeless tobacco: Not on file   Alcohol Use: No   OB History   Grav Para Term Preterm Abortions TAB SAB Ect Mult Living                 Review of Systems  Musculoskeletal: Positive for myalgias.  Neurological: Negative for syncope.  All other systems reviewed and are negative.  Allergies  Penicillins  Home Medications   Prior to Admission medications   Medication Sig Start Date End Date Taking? Authorizing Provider  diclofenac (VOLTAREN) 75 MG EC tablet Take 75 mg by mouth 2 (two) times daily.   Yes Historical Provider, MD  doxycycline (VIBRAMYCIN) 100 MG capsule Take 100 mg by mouth daily.   Yes Historical Provider, MD  ibuprofen (ADVIL,MOTRIN) 200 MG tablet Take 400 mg by mouth every 6 (six) hours as needed. For pain   Yes  Historical Provider, MD   BP 136/87   Pulse 67   Temp(Src) 98 F (36.7 C) (Oral)   Resp 18   Ht 5\' 4"  (1.626 m)   Wt 225 lb (102.059 kg)   BMI 38.60 kg/m2   SpO2 99% Physical Exam  Nursing note and vitals reviewed. Constitutional: She is oriented to person, place, and time. She appears well-developed and well-nourished. No distress.  HENT:  Head: Normocephalic and atraumatic.  Right Ear: Hearing normal.  Left Ear: Hearing normal.  Nose: Nose normal.  Mouth/Throat: Oropharynx is clear and moist and mucous membranes are normal.  Eyes: Conjunctivae and EOM are normal. Pupils are equal, round, and reactive to light.  Neck: Normal range of motion. Neck supple.  Cardiovascular: Regular rhythm, S1 normal and S2 normal.  Exam reveals no gallop and no friction rub.   No murmur heard. Pulmonary/Chest: Effort normal and breath sounds normal. No respiratory distress. She exhibits no tenderness.  Abdominal: Soft. Normal appearance and bowel sounds are normal. There is no hepatosplenomegaly. There is no tenderness. There is no rebound, no guarding, no tenderness at McBurney's point and negative Murphy's sign. No hernia.  Musculoskeletal: Normal range of motion.  TTP at left mid upper arm.   Neurological: She is alert and oriented to person, place, and time. She has normal strength. No cranial nerve  deficit or sensory deficit. Coordination normal. GCS eye subscore is 4. GCS verbal subscore is 5. GCS motor subscore is 6.  Skin: Skin is warm, dry and intact. No rash noted. No cyanosis.  Psychiatric: She has a normal mood and affect. Her speech is normal and behavior is normal. Thought content normal.    ED Course  Procedures (including critical care time) DIAGNOSTIC STUDIES: Oxygen Saturation is 99% on RA, normal by my interpretation.    COORDINATION OF CARE: 9:23 PM- Pt advised of plan for treatment which includes radiology and pt agrees.  Labs Review Labs Reviewed - No data to display  Imaging  Review No results found.   EKG Interpretation None      MDM   Final diagnoses:  None   proximal humerus fracture  Patient presents to ER for evaluation of left shoulder injury. Patient reports a fall prior to arrival. She denies any obvious deformity but there was tenderness in the mid and proximal humerus region. X-ray confirms humeral head fracture. Radiologist report subluxation. There is no evidence of dislocation. Does not require any emergent intervention. We'll sling, provide analgesia, follow up with orthopedics.  I personally performed the services described in this documentation, which was scribed in my presence. The recorded information has been reviewed and is accurate.      Orpah Greek, MD 06/08/14 2253

## 2014-06-08 NOTE — Discharge Instructions (Signed)
Humerus Fracture, Treated with Immobilization  The humerus is the large bone in your upper arm. You have a broken (fractured) humerus. These fractures are easily diagnosed with X-rays.  TREATMENT   Simple fractures which will heal without disability are treated with simple immobilization. Immobilization means you will wear a cast, splint, or sling. You have a fracture which will do well with immobilization. The fracture will heal well simply by being held in a good position until it is stable enough to begin range of motion exercises. Do not take part in activities which would further injure your arm.   HOME CARE INSTRUCTIONS    Put ice on the injured area.   Put ice in a plastic bag.   Place a towel between your skin and the bag.   Leave the ice on for 15-20 minutes, 03-04 times a day.   If you have a cast:   Do not scratch the skin under the cast using sharp or pointed objects.   Check the skin around the cast every day. You may put lotion on any red or sore areas.   Keep your cast dry and clean.   If you have a splint:   Wear the splint as directed.   Keep your splint dry and clean.   You may loosen the elastic around the splint if your fingers become numb, tingle, or turn cold or blue.   If you have a sling:   Wear the sling as directed.   Do not put pressure on any part of your cast or splint until it is fully hardened.   Your cast or splint can be protected during bathing with a plastic bag. Do not lower the cast or splint into water.   Only take over-the-counter or prescription medicines for pain, discomfort, or fever as directed by your caregiver.   Do range of motion exercises as instructed by your caregiver.   Follow up as directed by your caregiver. This is very important in order to avoid permanent injury or disability and chronic pain.  SEEK IMMEDIATE MEDICAL CARE IF:    Your skin or nails in the injured arm turn blue or gray.   Your arm feels cold or numb.   You develop severe  pain in the injured arm.   You are having problems with the medicines you were given.  MAKE SURE YOU:    Understand these instructions.   Will watch your condition.   Will get help right away if you are not doing well or get worse.  Document Released: 11/15/2000 Document Revised: 11/01/2011 Document Reviewed: 09/23/2010  ExitCare Patient Information 2015 [employer]. This information is not intended to replace advice given to you by your health care provider. Make sure you discuss any questions you have with your health care provider.

## 2014-06-08 NOTE — ED Notes (Signed)
Pt states she tripped and fell and landed on gravel, states she landed on her left arm, having pain from left elbow to her shoulder.

## 2014-06-17 MED FILL — Hydrocodone-Acetaminophen Tab 5-325 MG: ORAL | Qty: 6 | Status: AC

## 2014-06-26 ENCOUNTER — Ambulatory Visit
Admission: RE | Admit: 2014-06-26 | Discharge: 2014-06-26 | Disposition: A | Discharge status: Home / Self Care | Payer: BC Managed Care – PPO | Source: Ambulatory Visit | Attending: Orthopedic Surgery | Admitting: Orthopedic Surgery

## 2014-06-26 ENCOUNTER — Other Ambulatory Visit: Payer: Self-pay | Admitting: Orthopedic Surgery

## 2014-06-26 DIAGNOSIS — S4292XA Fracture of left shoulder girdle, part unspecified, initial encounter for closed fracture: Secondary | ICD-10-CM

## 2014-07-12 ENCOUNTER — Ambulatory Visit (HOSPITAL_COMMUNITY): Admission: RE | Admit: 2014-07-12 | Payer: BC Managed Care – PPO | Source: Ambulatory Visit

## 2014-07-12 ENCOUNTER — Ambulatory Visit (HOSPITAL_COMMUNITY)
Admission: RE | Admit: 2014-07-12 | Discharge: 2014-07-12 | Disposition: A | Discharge status: Home / Self Care | Payer: BC Managed Care – PPO | Source: Ambulatory Visit | Attending: Orthopedic Surgery | Admitting: Orthopedic Surgery

## 2014-07-12 ENCOUNTER — Other Ambulatory Visit (HOSPITAL_COMMUNITY): Payer: Self-pay | Admitting: Orthopedic Surgery

## 2014-07-12 ENCOUNTER — Other Ambulatory Visit (HOSPITAL_COMMUNITY): Payer: BC Managed Care – PPO

## 2014-07-12 DIAGNOSIS — R52 Pain, unspecified: Secondary | ICD-10-CM

## 2014-07-12 DIAGNOSIS — I82402 Acute embolism and thrombosis of unspecified deep veins of left lower extremity: Secondary | ICD-10-CM | POA: Diagnosis present

## 2014-07-12 DIAGNOSIS — R609 Edema, unspecified: Secondary | ICD-10-CM

## 2014-07-12 DIAGNOSIS — M7989 Other specified soft tissue disorders: Secondary | ICD-10-CM

## 2014-07-12 DIAGNOSIS — M79602 Pain in left arm: Secondary | ICD-10-CM

## 2014-08-22 ENCOUNTER — Ambulatory Visit (HOSPITAL_COMMUNITY)
Admission: RE | Admit: 2014-08-22 | Discharge: 2014-08-22 | Disposition: A | Discharge status: Home / Self Care | Payer: BC Managed Care – PPO | Source: Ambulatory Visit | Attending: Cardiovascular Disease | Admitting: Cardiovascular Disease

## 2014-08-22 ENCOUNTER — Other Ambulatory Visit (HOSPITAL_COMMUNITY): Payer: Self-pay | Admitting: Orthopedic Surgery

## 2014-08-22 DIAGNOSIS — M79642 Pain in left hand: Secondary | ICD-10-CM

## 2014-08-22 NOTE — Progress Notes (Signed)
Left Upper Extremity Venous Duplex Completed. [place]

## 2016-06-14 ENCOUNTER — Other Ambulatory Visit: Payer: Self-pay | Admitting: Obstetrics and Gynecology

## 2016-06-16 LAB — CYTOLOGY - PAP

## 2016-06-28 ENCOUNTER — Other Ambulatory Visit: Payer: Self-pay | Admitting: Cardiology

## 2016-06-28 DIAGNOSIS — R079 Chest pain, unspecified: Secondary | ICD-10-CM

## 2016-06-29 ENCOUNTER — Ambulatory Visit: Payer: BC Managed Care – PPO | Admitting: Cardiovascular Disease

## 2016-07-14 ENCOUNTER — Encounter (HOSPITAL_COMMUNITY)
Admission: RE | Admit: 2016-07-14 | Discharge: 2016-07-14 | Disposition: A | Discharge status: Home / Self Care | Payer: BC Managed Care – PPO | Source: Ambulatory Visit | Attending: Cardiology | Admitting: Cardiology

## 2016-07-14 DIAGNOSIS — R079 Chest pain, unspecified: Secondary | ICD-10-CM | POA: Insufficient documentation

## 2016-07-14 MED ORDER — TECHNETIUM TC 99M TETROFOSMIN IV KIT
30.0000 | PACK | Freq: Once | INTRAVENOUS | Status: AC | PRN
  Administered 2016-07-14: 30 via INTRAVENOUS

## 2016-07-14 MED ORDER — REGADENOSON 0.4 MG/5ML IV SOLN
0.4000 mg | Freq: Once | INTRAVENOUS | Status: AC
  Administered 2016-07-14: 0.4 mg via INTRAVENOUS

## 2016-07-14 MED ORDER — REGADENOSON 0.4 MG/5ML IV SOLN
INTRAVENOUS | Status: AC
  Filled 2016-07-14: qty 5

## 2016-07-14 MED ORDER — TECHNETIUM TC 99M TETROFOSMIN IV KIT
10.0000 | PACK | Freq: Once | INTRAVENOUS | Status: AC | PRN
  Administered 2016-07-14: 10 via INTRAVENOUS

## 2017-01-05 ENCOUNTER — Other Ambulatory Visit: Payer: Self-pay | Admitting: Obstetrics and Gynecology

## 2017-01-18 NOTE — Patient Instructions (Signed)
Your procedure is scheduled on:  Tuesday, January 25, 2017  Enter through the [employer] [facility] at:  11:15 AM  Pick up the phone at the desk and dial [phone].  Call this number if you have problems the morning of surgery: [phone].  Remember: Do NOT eat food:  After Midnight Monday  Do NOT drink clear liquids after:  8:30 AM day of surgery  Take these medicines the morning of surgery with a SIP OF WATER:  Buspirone, Losartan  Stop ALL herbal medications at this time  Do NOT smoke the day of surgery.  Do NOT wear jewelry (body piercing), metal hair clips/bobby pins, make-up, or nail polish. Do NOT wear lotions, powders, or perfumes.  You may wear deodorant. Do NOT shave for 48 hours prior to surgery. Do NOT bring valuables to the hospital. Contacts, dentures, or bridgework may not be worn into surgery.  Have a responsible adult drive you home and stay with you for 24 hours after your procedure  Bring a copy of your healthcare power of attorney and living will documents.

## 2017-01-19 ENCOUNTER — Encounter (HOSPITAL_COMMUNITY)
Admission: RE | Admit: 2017-01-19 | Discharge: 2017-01-19 | Disposition: A | Discharge status: Home / Self Care | Payer: BC Managed Care – PPO | Source: Ambulatory Visit | Attending: Obstetrics and Gynecology | Admitting: Obstetrics and Gynecology

## 2017-01-19 ENCOUNTER — Encounter (HOSPITAL_COMMUNITY): Payer: Self-pay

## 2017-01-19 DIAGNOSIS — Z01810 Encounter for preprocedural cardiovascular examination: Secondary | ICD-10-CM | POA: Insufficient documentation

## 2017-01-19 DIAGNOSIS — Z01812 Encounter for preprocedural laboratory examination: Secondary | ICD-10-CM | POA: Diagnosis present

## 2017-01-19 HISTORY — DX: Effusion, unspecified ankle: M25.473

## 2017-01-19 HISTORY — DX: Prediabetes: R73.03

## 2017-01-19 HISTORY — DX: Essential (primary) hypertension: I10

## 2017-01-19 HISTORY — DX: Anxiety disorder, unspecified: F41.9

## 2017-01-19 HISTORY — DX: Plantar fascial fibromatosis: M72.2

## 2017-01-19 HISTORY — DX: Other specified soft tissue disorders: M79.89

## 2017-01-19 LAB — CBC
HEMATOCRIT: 42.7 % (ref 36.0–46.0)
Hemoglobin: 14.5 g/dL (ref 12.0–15.0)
MCH: 29.7 pg (ref 26.0–34.0)
MCHC: 34 g/dL (ref 30.0–36.0)
MCV: 87.5 fL (ref 78.0–100.0)
Platelets: 190 10*3/uL (ref 150–400)
RBC: 4.88 MIL/uL (ref 3.87–5.11)
RDW: 13.5 % (ref 11.5–15.5)
WBC: 7.1 10*3/uL (ref 4.0–10.5)

## 2017-01-19 LAB — TYPE AND SCREEN
ABO/RH(D): A POS
ANTIBODY SCREEN: NEGATIVE

## 2017-01-19 LAB — BASIC METABOLIC PANEL
Anion gap: 9 (ref 5–15)
BUN: 16 mg/dL (ref 6–20)
CHLORIDE: 101 mmol/L (ref 101–111)
CO2: 24 mmol/L (ref 22–32)
CREATININE: 0.8 mg/dL (ref 0.44–1.00)
Calcium: 8.6 mg/dL — ABNORMAL LOW (ref 8.9–10.3)
GFR calc non Af Amer: >60 mL/min (ref >60)
Glucose, Bld: 94 mg/dL (ref 65–99)
Potassium: 3.7 mmol/L (ref 3.5–5.1)
Sodium: 134 mmol/L — ABNORMAL LOW (ref 135–145)

## 2017-01-19 LAB — ABO/RH: ABO/RH(D): A POS

## 2017-01-25 ENCOUNTER — Ambulatory Visit (HOSPITAL_COMMUNITY)
Admission: RE | Admit: 2017-01-25 | Discharge: 2017-01-25 | Disposition: A | Discharge status: Home / Self Care | Payer: BC Managed Care – PPO | Source: Ambulatory Visit | Attending: Obstetrics and Gynecology | Admitting: Obstetrics and Gynecology

## 2017-01-25 ENCOUNTER — Encounter (HOSPITAL_COMMUNITY): Payer: Self-pay | Admitting: *Deleted

## 2017-01-25 ENCOUNTER — Ambulatory Visit (HOSPITAL_COMMUNITY): Payer: BC Managed Care – PPO | Admitting: Anesthesiology

## 2017-01-25 ENCOUNTER — Encounter (HOSPITAL_COMMUNITY)
Admission: RE | Disposition: A | Discharge status: Home / Self Care | Payer: Self-pay | Source: Ambulatory Visit | Attending: Obstetrics and Gynecology

## 2017-01-25 DIAGNOSIS — I10 Essential (primary) hypertension: Secondary | ICD-10-CM | POA: Insufficient documentation

## 2017-01-25 DIAGNOSIS — Z7982 Long term (current) use of aspirin: Secondary | ICD-10-CM | POA: Insufficient documentation

## 2017-01-25 DIAGNOSIS — R938 Abnormal findings on diagnostic imaging of other specified body structures: Secondary | ICD-10-CM | POA: Diagnosis not present

## 2017-01-25 DIAGNOSIS — N840 Polyp of corpus uteri: Secondary | ICD-10-CM | POA: Insufficient documentation

## 2017-01-25 DIAGNOSIS — Z79899 Other long term (current) drug therapy: Secondary | ICD-10-CM | POA: Insufficient documentation

## 2017-01-25 DIAGNOSIS — N950 Postmenopausal bleeding: Secondary | ICD-10-CM | POA: Insufficient documentation

## 2017-01-25 DIAGNOSIS — Z7984 Long term (current) use of oral hypoglycemic drugs: Secondary | ICD-10-CM | POA: Diagnosis not present

## 2017-01-25 DIAGNOSIS — E119 Type 2 diabetes mellitus without complications: Secondary | ICD-10-CM | POA: Diagnosis not present

## 2017-01-25 DIAGNOSIS — N8502 Endometrial intraepithelial neoplasia [EIN]: Secondary | ICD-10-CM | POA: Diagnosis not present

## 2017-01-25 DIAGNOSIS — Z792 Long term (current) use of antibiotics: Secondary | ICD-10-CM | POA: Insufficient documentation

## 2017-01-25 DIAGNOSIS — Z880 Allergy status to penicillin: Secondary | ICD-10-CM | POA: Insufficient documentation

## 2017-01-25 DIAGNOSIS — F419 Anxiety disorder, unspecified: Secondary | ICD-10-CM | POA: Diagnosis not present

## 2017-01-25 HISTORY — DX: Type 2 diabetes mellitus without complications: E11.9

## 2017-01-25 HISTORY — PX: DILATATION & CURETTAGE/HYSTEROSCOPY WITH MYOSURE: SHX6511

## 2017-01-25 LAB — GLUCOSE, CAPILLARY
Glucose-Capillary: 94 mg/dL (ref 65–99)
Glucose-Capillary: 83 mg/dL (ref 65–99)

## 2017-01-25 SURGERY — DILATATION & CURETTAGE/HYSTEROSCOPY WITH MYOSURE
Anesthesia: General | Site: Vagina

## 2017-01-25 MED ORDER — LIDOCAINE HCL (CARDIAC) 20 MG/ML IV SOLN
INTRAVENOUS | Status: AC
  Filled 2017-01-25: qty 5

## 2017-01-25 MED ORDER — LIDOCAINE HCL 1 % IJ SOLN
INTRAMUSCULAR | Status: DC | PRN
  Administered 2017-01-25: 20 mL

## 2017-01-25 MED ORDER — PHENYLEPHRINE HCL 10 MG/ML IJ SOLN
INTRAMUSCULAR | Status: DC | PRN
  Administered 2017-01-25: 40 ug via INTRAVENOUS
  Administered 2017-01-25: 80 ug via INTRAVENOUS

## 2017-01-25 MED ORDER — DEXAMETHASONE SODIUM PHOSPHATE 4 MG/ML IJ SOLN
INTRAMUSCULAR | Status: AC
  Filled 2017-01-25: qty 1

## 2017-01-25 MED ORDER — ONDANSETRON HCL 4 MG/2ML IJ SOLN
INTRAMUSCULAR | Status: AC
  Filled 2017-01-25: qty 2

## 2017-01-25 MED ORDER — CLINDAMYCIN PHOSPHATE 900 MG/50ML IV SOLN
900.0000 mg | Freq: Once | INTRAVENOUS | Status: AC
  Administered 2017-01-25: 900 mg via INTRAVENOUS
  Filled 2017-01-25: qty 50

## 2017-01-25 MED ORDER — SODIUM CHLORIDE 0.9 % IR SOLN
Status: DC | PRN
  Administered 2017-01-25: 3000 mL

## 2017-01-25 MED ORDER — DEXAMETHASONE SODIUM PHOSPHATE 4 MG/ML IJ SOLN
INTRAMUSCULAR | Status: DC | PRN
  Administered 2017-01-25: 4 mg via INTRAVENOUS

## 2017-01-25 MED ORDER — LACTATED RINGERS IV SOLN
INTRAVENOUS | Status: DC
  Administered 2017-01-25 (×2): via INTRAVENOUS

## 2017-01-25 MED ORDER — PROPOFOL 10 MG/ML IV BOLUS
INTRAVENOUS | Status: DC | PRN
  Administered 2017-01-25: 150 mg via INTRAVENOUS

## 2017-01-25 MED ORDER — MIDAZOLAM HCL 2 MG/2ML IJ SOLN
INTRAMUSCULAR | Status: AC
Start: 2017-01-25 — End: ?
  Filled 2017-01-25: qty 2

## 2017-01-25 MED ORDER — SCOPOLAMINE 1 MG/3DAYS TD PT72
MEDICATED_PATCH | TRANSDERMAL | Status: DC
Start: 2017-01-25 — End: 2017-01-25
  Filled 2017-01-25: qty 1

## 2017-01-25 MED ORDER — FENTANYL CITRATE (PF) 100 MCG/2ML IJ SOLN
INTRAMUSCULAR | Status: AC
  Filled 2017-01-25: qty 2

## 2017-01-25 MED ORDER — FENTANYL CITRATE (PF) 100 MCG/2ML IJ SOLN
INTRAMUSCULAR | Status: DC | PRN
  Administered 2017-01-25 (×2): 50 ug via INTRAVENOUS

## 2017-01-25 MED ORDER — ONDANSETRON HCL 4 MG/2ML IJ SOLN
INTRAMUSCULAR | Status: DC | PRN
  Administered 2017-01-25: 4 mg via INTRAVENOUS

## 2017-01-25 MED ORDER — LIDOCAINE HCL (CARDIAC) 20 MG/ML IV SOLN
INTRAVENOUS | Status: DC | PRN
  Administered 2017-01-25: 70 mg via INTRAVENOUS

## 2017-01-25 MED ORDER — PHENYLEPHRINE 40 MCG/ML (10ML) SYRINGE FOR IV PUSH (FOR BLOOD PRESSURE SUPPORT)
PREFILLED_SYRINGE | INTRAVENOUS | Status: AC
  Filled 2017-01-25: qty 10

## 2017-01-25 MED ORDER — PROPOFOL 10 MG/ML IV BOLUS
INTRAVENOUS | Status: AC
  Filled 2017-01-25: qty 20

## 2017-01-25 MED ORDER — MIDAZOLAM HCL 2 MG/2ML IJ SOLN
INTRAMUSCULAR | Status: DC | PRN
  Administered 2017-01-25: 1 mg via INTRAVENOUS

## 2017-01-25 MED ORDER — PROMETHAZINE HCL 25 MG/ML IJ SOLN: 6.2500 mg | INTRAMUSCULAR | Status: DC | PRN

## 2017-01-25 MED ORDER — KETOROLAC TROMETHAMINE 30 MG/ML IJ SOLN
INTRAMUSCULAR | Status: AC
  Filled 2017-01-25: qty 1

## 2017-01-25 MED ORDER — LIDOCAINE HCL 1 % IJ SOLN
INTRAMUSCULAR | Status: AC
  Filled 2017-01-25: qty 20

## 2017-01-25 MED ORDER — HYDROMORPHONE HCL 1 MG/ML IJ SOLN: 0.2500 mg | INTRAMUSCULAR | Status: DC | PRN

## 2017-01-25 SURGICAL SUPPLY — 20 items
CANISTER SUCT 3000ML PPV (MISCELLANEOUS) ×2 IMPLANT
CATH ROBINSON RED A/P 16FR (CATHETERS) ×2 IMPLANT
CLOTH BEACON ORANGE TIMEOUT ST (SAFETY) ×2 IMPLANT
CONTAINER PREFILL 10% NBF 60ML (FORM) ×4 IMPLANT
DEVICE MYOSURE LITE (MISCELLANEOUS) ×2 IMPLANT
DEVICE MYOSURE REACH (MISCELLANEOUS) IMPLANT
DILATOR CANAL MILEX (MISCELLANEOUS) IMPLANT
ELECT REM PT RETURN 9FT ADLT (ELECTROSURGICAL) ×2
ELECTRODE REM PT RTRN 9FT ADLT (ELECTROSURGICAL) ×1 IMPLANT
FILTER ARTHROSCOPY CONVERTOR (FILTER) ×2 IMPLANT
GLOVE BIOGEL PI IND STRL 7.0 (GLOVE) ×1 IMPLANT
GLOVE BIOGEL PI INDICATOR 7.0 (GLOVE) ×1
GLOVE ECLIPSE 7.0 STRL STRAW (GLOVE) ×2 IMPLANT
GOWN STRL REUS W/TWL LRG LVL3 (GOWN DISPOSABLE) ×4 IMPLANT
PACK VAGINAL MINOR WOMEN LF (CUSTOM PROCEDURE TRAY) ×2 IMPLANT
PAD OB MATERNITY 4.3X12.25 (PERSONAL CARE ITEMS) ×2 IMPLANT
SEAL ROD LENS SCOPE MYOSURE (ABLATOR) ×2 IMPLANT
TOWEL OR 17X24 6PK STRL BLUE (TOWEL DISPOSABLE) ×4 IMPLANT
TUBING AQUILEX INFLOW (TUBING) ×2 IMPLANT
TUBING AQUILEX OUTFLOW (TUBING) ×2 IMPLANT

## 2017-01-25 NOTE — Anesthesia Postprocedure Evaluation (Signed)
Anesthesia Post Note  Patient: Stacie Marquez  Procedure(s) Performed: Procedure(s) (LRB): DILATATION & CURETTAGE/HYSTEROSCOPY WITH MYOSURE (N/A)     Patient location during evaluation: PACU Anesthesia Type: General Level of consciousness: sedated Pain management: pain level controlled Vital Signs Assessment: post-procedure vital signs reviewed and stable Respiratory status: spontaneous breathing and respiratory function stable Cardiovascular status: stable Anesthetic complications: no    Last Vitals:  Vitals:   01/25/17 1315 01/25/17 1340  BP: 121/77   Pulse: 64 (!) 47  Resp: 11 10  Temp:      Last Pain:  Vitals:   01/25/17 1202  TempSrc: Oral   Pain Goal: Patients Stated Pain Goal: 3 (01/25/17 1202)               Mariane Burpee DANIEL

## 2017-01-25 NOTE — Op Note (Signed)
NAME:  Stacie Marquez, Stacie Marquez NO.:  [id number]  MEDICAL RECORD NO.:  LOCATION:                                 FACILITY:  PHYSICIAN:  Freda Munro, M.D.         DATE OF BIRTH:  DATE OF PROCEDURE: DATE OF DISCHARGE:                              OPERATIVE REPORT   PREOPERATIVE DIAGNOSES: 1. Postmenopausal bleeding. 2. Thickened endometrium. 3. Suspected endometrial polyp.  POSTOPERATIVE DIAGNOSES: 1. Postmenopausal bleeding. 2. Thickened endometrium. 3. Large endometrial polyp.  SURGERY: 1. Diagnostic hysteroscopy. 2. Resection of polyp with MyoSure device. 3. Dilation and curettage.  SURGEON:  Freda Munro, M.D.  ANESTHESIA:  General and local.  ANTIBIOTICS:  Clindamycin 900 mg x1.  ESTIMATED BLOOD LOSS:  Minimal.  SPECIMENS:  Endometrial polyp and endometrial curettings sent to pathology.  ESTIMATED BLOOD LOSS:  15 mL.  COMPLICATIONS:  None.  PROCEDURE IN DETAIL:  The patient was taken to the operating room where she was placed in dorsal supine position.  A general anesthetic was administered without difficulty.  She was then placed in dorsal lithotomy position.  She was prepped and draped in the usual fashion for this procedure.  Her bladder was drained with a red rubber catheter.  A sterile speculum was placed in the vagina, 20 mL of 1% lidocaine was used for paracervical block.  A single-tooth tenaculum was applied to the anterior cervical lip.  The cervix was then serially dilated to a 47- [place].  The hysteroscope was advanced into the uterine cavity.  The endocervical canal appeared normal on entering the endometrial cavity. The endometrium appeared normal.  There was a large endometrial polyp seen.  At this point, the MyoSure device was set up and placed in the uterine cavity.  The polyp was removed easily.  Sharp curettage was then performed.  The patient tolerated the procedure well. She was taken to recovery room in stable condition.   Instrument and lap count was correct x2.  She will be discharged to home.  She will follow up in the office in 4 weeks.          ______________________________ Freda Munro, M.D.     MA/MEDQ  D:  01/25/2017  T:  01/25/2017  Job:  [id number]

## 2017-01-25 NOTE — Anesthesia Preprocedure Evaluation (Addendum)
Anesthesia Evaluation  Patient identified by MRN, date of birth, ID band Patient awake    Reviewed: Allergy & Precautions, NPO status , Patient's Chart, lab work & pertinent test results  History of Anesthesia Complications Negative for: history of anesthetic complications  Airway Mallampati: II  TM Distance: >3 FB Neck ROM: Full    Dental no notable dental hx. (+) Dental Advisory Given   Pulmonary neg pulmonary ROS,    Pulmonary exam normal        Cardiovascular hypertension, Pt. on medications negative cardio ROS Normal cardiovascular exam     Neuro/Psych PSYCHIATRIC DISORDERS Anxiety negative neurological ROS     GI/Hepatic negative GI ROS, Neg liver ROS,   Endo/Other  diabetesMorbid obesity  Renal/GU negative Renal ROS  negative genitourinary   Musculoskeletal negative musculoskeletal ROS (+)   Abdominal   Peds negative pediatric ROS (+)  Hematology negative hematology ROS (+)   Anesthesia Other Findings   Reproductive/Obstetrics negative OB ROS                            Anesthesia Physical Anesthesia Plan  ASA: III  Anesthesia Plan: General   Post-op Pain Management:    Induction: Intravenous  PONV Risk Score and Plan: 4 or greater and Ondansetron, Dexamethasone, Scopolamine patch - Pre-op, Treatment may vary due to age and Diphenhydramine  Airway Management Planned: LMA and Oral ETT  Additional Equipment:   Intra-op Plan:   Post-operative Plan: Extubation in OR  Informed Consent: I have reviewed the patients History and Physical, chart, labs and discussed the procedure including the risks, benefits and alternatives for the proposed anesthesia with the patient or authorized representative who has indicated his/her understanding and acceptance.   Dental advisory given  Plan Discussed with: CRNA, Anesthesiologist and Surgeon  Anesthesia Plan Comments:         Anesthesia Quick Evaluation

## 2017-01-25 NOTE — Transfer of Care (Signed)
Immediate Anesthesia Transfer of Care Note  Patient: Stacie Marquez  Procedure(s) Performed: Procedure(s): DILATATION & CURETTAGE/HYSTEROSCOPY WITH MYOSURE (N/A)  Patient Location: PACU  Anesthesia Type:General  Level of Consciousness: awake, alert , oriented and patient cooperative  Airway & Oxygen Therapy: Patient Spontanous Breathing and Patient connected to nasal cannula oxygen  Post-op Assessment: Report given to RN and Post -op Vital signs reviewed and stable  Post vital signs: Reviewed and stable  Last Vitals:  Vitals:   01/25/17 1202  BP: (!) 122/55  Pulse: (!) 52  Resp: 20  Temp: 36.6 C    Last Pain:  Vitals:   01/25/17 1202  TempSrc: Oral      Patients Stated Pain Goal: 3 (84/83/50 7573)  Complications: No apparent anesthesia complications

## 2017-01-25 NOTE — H&P (Signed)
Stacie Marquez is an 66 y.o. white female who presents to the OR for a hysteroscopy/D&C possible myosure for postmenopausal bleeding with a thickened myometrium.   Chief Complaint: HPI:  Past Medical History:  Diagnosis Date   Ankle swelling    bilateral   Anxiety    Diabetes mellitus without complication ([place])    Hypertension    Leg swelling    bilateral   Plantar fasciitis    left foot   Pre-diabetes    Rosacea     Past Surgical History:  Procedure Laterality Date   ACHILLES TENDON SURGERY     CESAREAN SECTION     CHOLECYSTECTOMY  07/27/2012   Procedure: LAPAROSCOPIC CHOLECYSTECTOMY WITH INTRAOPERATIVE CHOLANGIOGRAM;  Surgeon: Leighton Ruff, MD;  Location: [place];  Service: General;  Laterality: N/A;   COLONOSCOPY      History reviewed. No pertinent family history. Social History:  reports that she has never smoked. She has never used smokeless tobacco. She reports that she does not drink alcohol or use drugs.  Allergies:  Allergies  Allergen Reactions   Penicillins Rash    Medications Prior to Admission  Medication Sig Dispense Refill   aspirin EC 81 MG tablet Take 81 mg by mouth daily.     busPIRone (BUSPAR) 15 MG tablet Take 7.5 mg by mouth 2 (two) times daily.     furosemide (LASIX) 40 MG tablet Take 40 mg by mouth as needed for edema.     ibuprofen (ADVIL,MOTRIN) 100 MG tablet Take 100 mg by mouth every 6 (six) hours as needed for fever.     losartan (COZAAR) 50 MG tablet Take 50 mg by mouth daily.     metFORMIN (GLUCOPHAGE-XR) 500 MG 24 hr tablet Take 500 mg by mouth at bedtime.  2   acetaminophen (TYLENOL) 500 MG tablet Take 500 mg by mouth every 6 (six) hours as needed for mild pain or moderate pain.     doxycycline (VIBRAMYCIN) 100 MG capsule Take 100 mg by mouth daily.          Blood pressure (!) 122/55, pulse (!) 52, temperature 97.8 F (36.6 C), temperature source Oral, resp. rate 20, SpO2 97 %. General appearance: alert,  cooperative and mildly obese Lungs: clear to auscultation bilaterally Heart: regular rate and rhythm, S1, S2 normal, no murmur, click, rub or gallop Abdomen: soft, non-tender; bowel sounds normal; no masses,  no organomegaly   Lab Results  Component Value Date   WBC 7.1 01/19/2017   HGB 14.5 01/19/2017   HCT 42.7 01/19/2017   MCV 87.5 01/19/2017   PLT 190 01/19/2017   No results found for: PREGTESTUR, PREGSERUM, HCG, HCGQUANT     There are no active problems to display for this patient.  IMP/ postmenopausal bleeding with a thickened endometrium Plan/hysteroscopy with D&C  Aneliese Beaudry E 01/25/2017, 12:26 PM

## 2017-01-25 NOTE — Anesthesia Procedure Notes (Signed)
Procedure Name: LMA Insertion Date/Time: 01/25/2017 12:43 PM Performed by: Raenette Rover Pre-anesthesia Checklist: Patient identified, Emergency Drugs available, Suction available and Patient being monitored Patient Re-evaluated:Patient Re-evaluated prior to inductionOxygen Delivery Method: Circle system utilized Preoxygenation: Pre-oxygenation with 100% oxygen Intubation Type: IV induction LMA: LMA inserted LMA Size: 4.0 Number of attempts: 1 Placement Confirmation: positive ETCO2,  CO2 detector and breath sounds checked- equal and bilateral Tube secured with: Tape Dental Injury: Teeth and Oropharynx as per pre-operative assessment

## 2017-01-25 NOTE — Discharge Instructions (Addendum)
DISCHARGE INSTRUCTIONS: HYSTEROSCOPY / ENDOMETRIAL ABLATION °The following instructions have been prepared to help you care for yourself upon your return home. ° °May Remove Scop patch on or before ° °May take Ibuprofen after ° °May take stool softner while taking narcotic pain medication to prevent constipation.  Drink plenty of water. ° °Personal hygiene: °• Use sanitary pads for vaginal drainage, not tampons. °• Shower the day after your procedure. °• NO tub baths, pools or Jacuzzis for 2-3 weeks. °• Wipe front to back after using the bathroom. ° °Activity and limitations: °• Do NOT drive or operate any equipment for 24 hours. The effects of anesthesia are still present °and drowsiness may result. °• Do NOT rest in bed all day. °• Walking is encouraged. °• Walk up and down stairs slowly. °• You may resume your normal activity in one to two days or as indicated by your physician. °Sexual activity: NO intercourse for at least 2 weeks after the procedure, or as indicated by your °Doctor. ° °Diet: Eat a light meal as desired this evening. You may resume your usual diet tomorrow. ° °Return to Work: You may resume your work activities in one to two days or as indicated by your °Doctor. ° °What to expect after your surgery: Expect to have vaginal bleeding/discharge for 2-3 days and °spotting for up to 10 days. It is not unusual to have soreness for up to 1-2 weeks. You may have a °slight burning sensation when you urinate for the first day. Mild cramps may continue for a couple of °days. You may have a regular period in 2-6 weeks. ° °Call your doctor for any of the following: °• Excessive vaginal bleeding or clotting, saturating and changing one pad every hour. °• Inability to urinate 6 hours after discharge from hospital. °• Pain not relieved by pain medication. °• Fever of 100.4° F or greater. °• Unusual vaginal discharge or odor. ° °Return to office _________________Call for an appointment  ___________________ °Patient’s signature: ______________________ °Nurse’s signature ________________________ ° °Post Anesthesia Care Unit [phone] °Post Anesthesia Home Care Instructions ° °Activity: °Get plenty of rest for the remainder of the day. A responsible individual must stay with you for 24 hours following the procedure.  °For the next 24 hours, DO NOT: °-Drive a car °-Operate machinery °-Drink alcoholic beverages °-Take any medication unless instructed by your physician °-Make any legal decisions or sign important papers. ° °Meals: °Start with liquid foods such as gelatin or soup. Progress to regular foods as tolerated. Avoid greasy, spicy, heavy foods. If nausea and/or vomiting occur, drink only clear liquids until the nausea and/or vomiting subsides. Call your physician if vomiting continues. ° °Special Instructions/Symptoms: °Your throat may feel dry or sore from the anesthesia or the breathing tube placed in your throat during surgery. If this causes discomfort, gargle with warm salt water. The discomfort should disappear within 24 hours. ° °If you had a scopolamine patch placed behind your ear for the management of post- operative nausea and/or vomiting: ° °1. The medication in the patch is effective for 72 hours, after which it should be removed.  Wrap patch in a tissue and discard in the trash. Wash hands thoroughly with soap and water. °2. You may remove the patch earlier than 72 hours if you experience unpleasant side effects which may include dry mouth, dizziness or visual disturbances. °3. Avoid touching the patch. Wash your hands with soap and water after contact with the patch. °  ° °

## 2017-01-26 ENCOUNTER — Encounter (HOSPITAL_COMMUNITY): Payer: Self-pay | Admitting: Obstetrics and Gynecology

## 2017-01-28 ENCOUNTER — Telehealth: Payer: Self-pay | Admitting: *Deleted

## 2017-01-28 NOTE — Telephone Encounter (Signed)
Contacted the patient and gve her the date/time of the new appt.

## 2017-02-02 ENCOUNTER — Ambulatory Visit: Payer: BC Managed Care – PPO | Attending: Gynecologic Oncology | Admitting: Gynecologic Oncology

## 2017-02-02 ENCOUNTER — Encounter: Payer: Self-pay | Admitting: Gynecologic Oncology

## 2017-02-02 VITALS — BP 135/54 | HR 68 | Temp 97.7°F | Resp 20 | Ht 64.0 in | Wt 263.8 lb

## 2017-02-02 DIAGNOSIS — I10 Essential (primary) hypertension: Secondary | ICD-10-CM | POA: Diagnosis not present

## 2017-02-02 DIAGNOSIS — N8502 Endometrial intraepithelial neoplasia [EIN]: Secondary | ICD-10-CM | POA: Diagnosis not present

## 2017-02-02 DIAGNOSIS — Z7982 Long term (current) use of aspirin: Secondary | ICD-10-CM | POA: Insufficient documentation

## 2017-02-02 DIAGNOSIS — Z79899 Other long term (current) drug therapy: Secondary | ICD-10-CM | POA: Insufficient documentation

## 2017-02-02 DIAGNOSIS — F419 Anxiety disorder, unspecified: Secondary | ICD-10-CM | POA: Insufficient documentation

## 2017-02-02 DIAGNOSIS — L918 Other hypertrophic disorders of the skin: Secondary | ICD-10-CM | POA: Insufficient documentation

## 2017-02-02 DIAGNOSIS — Z880 Allergy status to penicillin: Secondary | ICD-10-CM | POA: Diagnosis not present

## 2017-02-02 DIAGNOSIS — E119 Type 2 diabetes mellitus without complications: Secondary | ICD-10-CM | POA: Insufficient documentation

## 2017-02-02 DIAGNOSIS — Z7984 Long term (current) use of oral hypoglycemic drugs: Secondary | ICD-10-CM | POA: Diagnosis not present

## 2017-02-02 DIAGNOSIS — N950 Postmenopausal bleeding: Secondary | ICD-10-CM

## 2017-02-02 NOTE — Patient Instructions (Signed)
Preparing for your Surgery  Plan for surgery on February 15, 2017 with Dr. Everitt Amber at [facility] will be scheduled for a robotic assisted total laparoscopic hysterectomy, bilateral salpingo-oophorectomy, sentinel lymph node biopsy, left thigh biopsy.  Pre-operative Testing -You will receive a phone call from presurgical testing at [facility] to arrange for a pre-operative testing appointment before your surgery.  This appointment normally occurs one to two weeks before your scheduled surgery.   -Bring your insurance card, copy of an advanced directive if applicable, medication list  -At that visit, you will be asked to sign a consent for a possible blood transfusion in case a transfusion becomes necessary during surgery.  The need for a blood transfusion is rare but having consent is a necessary part of your care.     -You should not be taking blood thinners or aspirin at least ten days prior to surgery unless instructed by your surgeon.  Day Before Surgery at [facility] will be asked to take in a light diet the day before surgery.  Avoid carbonated beverages.  You will be advised to have nothing to eat or drink after midnight the evening before.     Eat a light diet the day before surgery.  Examples including soups, broths, toast, yogurt, mashed potatoes.  Things to avoid include carbonated beverages (fizzy beverages), raw fruits and raw vegetables, or beans.    If your bowels are filled with gas, your surgeon will have difficulty visualizing your pelvic organs which increases your surgical risks.  Your role in recovery Your role is to become active as soon as directed by your doctor, while still giving yourself time to heal.  Rest when you feel tired. You will be asked to do the following in order to speed your recovery:  - Cough and breathe deeply. This helps toclear and expand your lungs and can prevent pneumonia. You may be given a spirometer to  practice deep breathing. A staff member will show you how to use the spirometer. - Do mild physical activity. Walking or moving your legs help your circulation and body functions return to normal. A staff member will help you when you try to walk and will provide you with simple exercises. Do not try to get up or walk alone the first time. - Actively manage your pain. Managing your pain lets you move in comfort. We will ask you to rate your pain on a scale of zero to 10. It is your responsibility to tell your doctor or nurse where and how much you hurt so your pain can be treated.  Special Considerations -If you are diabetic, you may be placed on insulin after surgery to have closer control over your blood sugars to promote healing and recovery.  This does not mean that you will be discharged on insulin.  If applicable, your oral antidiabetics will be resumed when you are tolerating a solid diet.  -Your final pathology results from surgery should be available by the Friday after surgery and the results will be relayed to you when available.   Blood Transfusion Information WHAT IS A BLOOD TRANSFUSION? A transfusion is the replacement of blood or some of its parts. Blood is made up of multiple cells which provide different functions.  Red blood cells carry oxygen and are used for blood loss replacement.  White blood cells fight against infection.  Platelets control bleeding.  Plasma helps clot blood.  Other blood products are available for specialized  needs, such as hemophilia or other clotting disorders. BEFORE THE TRANSFUSION  Who gives blood for transfusions?   You may be able to donate blood to be used at a later date on yourself (autologous donation).  Relatives can be asked to donate blood. This is generally not any safer than if you have received blood from a stranger. The same precautions are taken to ensure safety when a relative's blood is donated.  Healthy volunteers who are  fully evaluated to make sure their blood is safe. This is blood bank blood. Transfusion therapy is the safest it has ever been in the practice of medicine. Before blood is taken from a donor, a complete history is taken to make sure that person has no history of diseases nor engages in risky social behavior (examples are intravenous drug use or sexual activity with multiple partners). The donor's travel history is screened to minimize risk of transmitting infections, such as malaria. The donated blood is tested for signs of infectious diseases, such as HIV and hepatitis. The blood is then tested to be sure it is compatible with you in order to minimize the chance of a transfusion reaction. If you or a relative donates blood, this is often done in anticipation of surgery and is not appropriate for emergency situations. It takes many days to process the donated blood. RISKS AND COMPLICATIONS Although transfusion therapy is very safe and saves many lives, the main dangers of transfusion include:   Getting an infectious disease.  Developing a transfusion reaction. This is an allergic reaction to something in the blood you were given. Every precaution is taken to prevent this. The decision to have a blood transfusion has been considered carefully by your caregiver before blood is given. Blood is not given unless the benefits outweigh the risks.

## 2017-02-02 NOTE — Progress Notes (Signed)
Consult Note: Gyn-Onc  Consult was requested by Dr. Ouida Sills for the evaluation of Stacie Marquez 66 y.o. female  CC:  Chief Complaint  Patient presents with   Complex Atypical Endometrial Hyperplasia    Assessment/Plan:  Stacie Marquez  is a 66 y.o.  year old with complex atypical hyperplasia of the endometrium and morbid obesity.  I discussed the nature of this lesion and the fact that 40% coexist with invasive endometrial carcinoma. I discussed the two options of therapy including 1/ progesterone (I would recommend mirena IUD vs oral progesterone). 2/ surgery with hysterectomy, BSO and SLN biopsy. This would be the most definitive therapy, however carries with it increased risks of surgery including  bleeding, infection, damage to internal organs (such as bladder,ureters, bowels), blood clot, reoperation and rehospitalization. She is at increased risks given her morbid obesity. However, this option provides pathologic staging information if there is co-existent cancer.  After considering her options she is interested in surgery with a robotic assisted total hysterectomy, BSO, SLN biopsy. I recommended she discontinue ASA now until after surgery as it increases bleeding risk.  The patient has a left upper thigh skin tag and requests excision at the time of surgery. We will accommodate this.  HPI: [name] is a 66 year old morbidly obese woman seen in consultation at the request of Dr Ouida Sills for [facility].  The patient reports that she was started on ASA in February 2018. She began experiencing postmenopausal bleeding. This prompted her to be seen by Dr Ouida Sills and an [place] was peformed on 12/29/16 which showed a 6.2x4.6x5.2cm uterus with a thickened endometrium.  A hysteroscopy with D&C was performed on 01/25/17 which showed an endoemtrial polyp with CAH and the curettage showed CAH.   She has a past history of 1 SVD and 1 cesarean. She has had a laparoscopic chylecystectomy in the  past. She had a cardiac stress test in 2017 which was unremarkable for blockage.  Current Meds:  Outpatient Encounter Prescriptions as of 02/02/2017  Medication Sig   acetaminophen (TYLENOL) 500 MG tablet Take 500 mg by mouth every 6 (six) hours as needed for mild pain or moderate pain.   aspirin EC 81 MG tablet Take 81 mg by mouth daily.   busPIRone (BUSPAR) 15 MG tablet Take 7.5 mg by mouth 2 (two) times daily.   furosemide (LASIX) 40 MG tablet Take 40 mg by mouth as needed for edema.   losartan (COZAAR) 50 MG tablet Take 50 mg by mouth daily.   metFORMIN (GLUCOPHAGE-XR) 500 MG 24 hr tablet Take 500 mg by mouth at bedtime.   No facility-administered encounter medications on file as of 02/02/2017.     Allergy:  Allergies  Allergen Reactions   Penicillins Rash    Social Hx:   Social History   Social History   Marital status: Single    Spouse name: N/A   Number of children: N/A   Years of education: N/A   Occupational History   Not on file.   Social History Main Topics   Smoking status: Never Smoker   Smokeless tobacco: Never Used   Alcohol use No   Drug use: No   Sexual activity: Not on file   Other Topics Concern   Not on file   Social History Narrative   No narrative on file    Past Surgical Hx:  Past Surgical History:  Procedure Laterality Date   ACHILLES TENDON SURGERY     CESAREAN SECTION  CHOLECYSTECTOMY  07/27/2012   Procedure: LAPAROSCOPIC CHOLECYSTECTOMY WITH INTRAOPERATIVE CHOLANGIOGRAM;  Surgeon: Leighton Ruff, MD;  Location: [facility];  Service: General;  Laterality: N/A;   COLONOSCOPY     DILATATION & CURETTAGE/HYSTEROSCOPY WITH MYOSURE N/A 01/25/2017   Procedure: Bud;  Surgeon: Olga Millers, MD;  Location: [facility];  Service: Gynecology;  Laterality: N/A;    Past Medical Hx:  Past Medical History:  Diagnosis Date   Ankle swelling    bilateral   Anxiety    Diabetes  mellitus without complication (HCC)    Hypertension    Leg swelling    bilateral   Plantar fasciitis    left foot   Pre-diabetes    Rosacea     Past Gynecological History:  SVD x 1 c/s x 1 No LMP recorded. Patient is postmenopausal.  Family Hx: History reviewed. No pertinent family history.  Review of Systems:  Constitutional  Feels well,    ENT Normal appearing ears and nares bilaterally Skin/Breast  No rash, sores, jaundice, itching, dryness, + skin tag left thigh Cardiovascular  No chest pain, shortness of breath, or edema  Pulmonary  No cough or wheeze.  Gastro Intestinal  No nausea, vomitting, or diarrhoea. No bright red blood per rectum, no abdominal pain, change in bowel movement, or constipation.  Genito Urinary  No frequency, urgency, dysuria, + postmenopausal bleeding Musculo Skeletal  No myalgia, arthralgia, joint swelling or pain  Neurologic  No weakness, numbness, change in gait,  Psychology  No depression, anxiety, insomnia.   Vitals:  Blood pressure (!) 135/54, pulse 68, temperature 97.7 F (36.5 C), resp. rate 20, height 5\' 4"  (1.626 m), weight 263 lb 12.8 oz (119.7 kg), SpO2 98 %.  Physical Exam: WD in NAD Neck  Supple NROM, without any enlargements.  Lymph Node Survey No cervical supraclavicular or inguinal adenopathy Cardiovascular  Pulse normal rate, regularity and rhythm. S1 and S2 normal.  Lungs  Clear to auscultation bilateraly, without wheezes/crackles/rhonchi. Good air movement.  Skin  No rash/lesions/breakdown  Psychiatry  Alert and oriented to person, place, and time  Abdomen  Normoactive bowel sounds, abdomen soft, non-tender and obese without evidence of hernia.  Back No CVA tenderness Genito Urinary  Vulva/vagina: Normal external female genitalia.   No lesions. No discharge or bleeding.  Bladder/urethra:  No lesions or masses, well supported bladder  Vagina: normal  Cervix: Normal appearing, no lesions.  Uterus:  Small,  mobile, no parametrial involvement or nodularity.  Adnexa: no palpable masses. Rectal  deferred.  Extremities  No bilateral cyanosis, clubbing or edema.   [name], MD  02/02/2017, 10:51 AM

## 2017-02-03 ENCOUNTER — Telehealth: Payer: Self-pay | Admitting: Gynecologic Oncology

## 2017-02-03 ENCOUNTER — Encounter: Payer: Self-pay | Admitting: Gynecologic Oncology

## 2017-02-03 NOTE — Progress Notes (Signed)
Letter requesting cardiac clearance faxed to Dr. Terrence Dupont at [phone].  His office number is [phone]

## 2017-02-03 NOTE — Progress Notes (Signed)
Called and spoke to [place] , [place] for [facility] about patient's Stress Test result from 07/14/2016 and she was able to pull it up on EPIC from her computer . I informed her that per Anesthesia Guidelines , the patient would need Cardiac Clearance. Melissa verbalized understanding.

## 2017-02-03 NOTE — Telephone Encounter (Signed)
Left message for patient stating she will need to have cardiac clearance prior to surgery per pre-operative testing.  Advised her to please call the office to discuss.

## 2017-02-08 ENCOUNTER — Telehealth: Payer: Self-pay | Admitting: Gynecologic Oncology

## 2017-02-08 NOTE — Telephone Encounter (Signed)
Called Dr. Zenia Resides office to follow up on request for cardiac clearance.  The receptionist stated it is on his list to complete.  Advised her that the patient's surgery is scheduled for the 26th and we would need cardiac clearance as soon as possible or have the patient be seen if that is what is needed.  Will continue to follow.

## 2017-02-09 ENCOUNTER — Telehealth: Payer: Self-pay | Admitting: Gynecologic Oncology

## 2017-02-09 NOTE — Patient Instructions (Signed)
Stacie Marquez  02/09/2017   Your procedure is scheduled on: 02-15-17  Report to [facility] Main  Entrance Take [place]  elevators to 3rd floor to  Short Stay Center at [facility].   Call this number if you have problems the morning of surgery [phone]   Remember: ONLY 1 PERSON MAY GO WITH YOU TO SHORT STAY TO GET  READY MORNING OF YOUR SURGERY.    Eat a light diet the day before surgery on Monday 02-14-17.  Examples including soups, broths, toast, yogurt, mashed potatoes.  Things to avoid include carbonated beverages (fizzy beverages), raw fruits and raw vegetables, or beans. If your bowels are filled with gas, your surgeon will have difficulty visualizing your pelvic organs which increases your surgical risks. Do not eat food or drink liquids :After Midnight.     Take these medicines the morning of surgery with A SIP OF WATER: buspirone(buspar), tylenol as needed  DO NOT TAKE ANY DIABETIC MEDICATIONS DAY OF YOUR SURGERY                               You may not have any metal on your body including hair pins and              piercings  Do not wear jewelry, make-up, lotions, powders or perfumes, deodorant             Do not wear nail polish.  Do not shave  48 hours prior to surgery.            Do not bring valuables to the hospital. [facility] IS NOT             RESPONSIBLE   FOR VALUABLES.  Contacts, dentures or bridgework may not be worn into surgery.  Leave suitcase in the car. After surgery it may be brought to your room.               Please read over the following fact sheets you were given: _____________________________________________________________________   How to Manage Your Diabetes Before and After Surgery  Why is it important to control my blood sugar before and after surgery? . Improving blood sugar levels before and after surgery helps healing and can limit problems. . A way of improving blood sugar control is eating a healthy diet by: o   Eating less sugar and carbohydrates o  Increasing activity/exercise o  Talking with your doctor about reaching your blood sugar goals . High blood sugars (greater than 180 mg/dL) can raise your risk of infections and slow your recovery, so you will need to focus on controlling your diabetes during the weeks before surgery. . Make sure that the doctor who takes care of your diabetes knows about your planned surgery including the date and location.  How do I manage my blood sugar before surgery? . Check your blood sugar at least 4 times a day, starting 2 days before surgery, to make sure that the level is not too high or low. o Check your blood sugar the morning of your surgery when you wake up and every 2 hours until you get to the Short Stay unit. . If your blood sugar is less than 70 mg/dL, you will need to treat for low blood sugar: o Do not take insulin. o Treat a low blood sugar (less than 70  mg/dL) with  cup of clear juice (cranberry or apple), 4 glucose tablets, OR glucose gel. o Recheck blood sugar in 15 minutes after treatment (to make sure it is greater than 70 mg/dL). If your blood sugar is not greater than 70 mg/dL on recheck, call [phone] for further instructions. . Report your blood sugar to the short stay nurse when you get to Short Stay.  . If you are admitted to the hospital after surgery: o Your blood sugar will be checked by the staff and you will probably be given insulin after surgery (instead of oral diabetes medicines) to make sure you have good blood sugar levels. o The goal for blood sugar control after surgery is 80-180 mg/dL.   WHAT DO I DO ABOUT MY DIABETES MEDICATION?   . THE DAY BEFORE SURGERY, take  Metformin as usual       . THE MORNING OF SURGERY,Do not take oral diabetes medicines (pills)    Patient Signature:  Date:   Nurse Signature:  Date:   Reviewed and Endorsed by [facility] Patient Education Committee, August 2015    [facility] -  Preparing for Surgery Before surgery, you can play an important role.  Because skin is not sterile, your skin needs to be as free of germs as possible.  You can reduce the number of germs on your skin by washing with CHG (chlorahexidine gluconate) soap before surgery.  CHG is an antiseptic cleaner which kills germs and bonds with the skin to continue killing germs even after washing. Please DO NOT use if you have an allergy to CHG or antibacterial soaps.  If your skin becomes reddened/irritated stop using the CHG and inform your nurse when you arrive at Short Stay. Do not shave (including legs and underarms) for at least 48 hours prior to the first CHG shower.  You may shave your face/neck. Please follow these instructions carefully:  1.  Shower with CHG Soap the night before surgery and the  morning of Surgery.  2.  If you choose to wash your hair, wash your hair first as usual with your  normal  shampoo.  3.  After you shampoo, rinse your hair and body thoroughly to remove the  shampoo.                           4.  Use CHG as you would any other liquid soap.  You can apply chg directly  to the skin and wash                       Gently with a scrungie or clean washcloth.  5.  Apply the CHG Soap to your body ONLY FROM THE NECK DOWN.   Do not use on face/ open                           Wound or open sores. Avoid contact with eyes, ears mouth and genitals (private parts).                       Wash face,  Genitals (private parts) with your normal soap.             6.  Wash thoroughly, paying special attention to the area where your surgery  will be performed.  7.  Thoroughly rinse your body with warm water from the neck down.  8.  DO NOT shower/wash with your normal soap after using and rinsing off  the CHG Soap.                9.  Pat yourself dry with a clean towel.            10.  Wear clean pajamas.            11.  Place clean sheets on your bed the night of your first shower and do not  sleep  with pets. Day of Surgery : Do not apply any lotions/deodorants the morning of surgery.  Please wear clean clothes to the hospital/surgery center.  FAILURE TO FOLLOW THESE INSTRUCTIONS MAY RESULT IN THE CANCELLATION OF YOUR SURGERY PATIENT SIGNATURE_________________________________  NURSE SIGNATURE__________________________________  ________________________________________________________________________   Stacie Marquez  An incentive spirometer is a tool that can help keep your lungs clear and active. This tool measures how well you are filling your lungs with each breath. Taking long deep breaths may help reverse or decrease the chance of developing breathing (pulmonary) problems (especially infection) following:  A long period of time when you are unable to move or be active. BEFORE THE PROCEDURE   If the spirometer includes an indicator to show your best effort, your nurse or respiratory therapist will set it to a desired goal.  If possible, sit up straight or lean slightly forward. Try not to slouch.  Hold the incentive spirometer in an upright position. INSTRUCTIONS FOR USE  1. Sit on the edge of your bed if possible, or sit up as far as you can in bed or on a chair. 2. Hold the incentive spirometer in an upright position. 3. Breathe out normally. 4. Place the mouthpiece in your mouth and seal your lips tightly around it. 5. Breathe in slowly and as deeply as possible, raising the piston or the ball toward the top of the column. 6. Hold your breath for 3-5 seconds or for as long as possible. Allow the piston or ball to fall to the bottom of the column. 7. Remove the mouthpiece from your mouth and breathe out normally. 8. Rest for a few seconds and repeat Steps 1 through 7 at least 10 times every 1-2 hours when you are awake. Take your time and take a few normal breaths between deep breaths. 9. The spirometer may include an indicator to show your best effort. Use the  indicator as a goal to work toward during each repetition. 10. After each set of 10 deep breaths, practice coughing to be sure your lungs are clear. If you have an incision (the cut made at the time of surgery), support your incision when coughing by placing a pillow or rolled up towels firmly against it. Once you are able to get out of bed, walk around indoors and cough well. You may stop using the incentive spirometer when instructed by your caregiver.  RISKS AND COMPLICATIONS  Take your time so you do not get dizzy or light-headed.  If you are in pain, you may need to take or ask for pain medication before doing incentive spirometry. It is harder to take a deep breath if you are having pain. AFTER USE  Rest and breathe slowly and easily.  It can be helpful to keep track of a log of your progress. Your caregiver can provide you with a simple table to help with this. If you are using the spirometer at home, follow these instructions: SEEK MEDICAL CARE IF:  You are having difficultly using the spirometer.  You have trouble using the spirometer as often as instructed.  Your pain medication is not giving enough relief while using the spirometer.  You develop fever of 100.5 F (38.1 C) or higher. SEEK IMMEDIATE MEDICAL CARE IF:   You cough up bloody sputum that had not been present before.  You develop fever of 102 F (38.9 C) or greater.  You develop worsening pain at or near the incision site. MAKE SURE YOU:   Understand these instructions.  Will watch your condition.  Will get help right away if you are not doing well or get worse. Document Released: 12/20/2006 Document Revised: 11/01/2011 Document Reviewed: 02/20/2007 Exit[employer] Patient Information 2014 ExitCare, [place].   ________________________________________________________________________  WHAT IS A BLOOD TRANSFUSION? Blood Transfusion Information  A transfusion is the replacement of blood or some of its parts.  Blood is made up of multiple cells which provide different functions.  Red blood cells carry oxygen and are used for blood loss replacement.  White blood cells fight against infection.  Platelets control bleeding.  Plasma helps clot blood.  Other blood products are available for specialized needs, such as hemophilia or other clotting disorders. BEFORE THE TRANSFUSION  Who gives blood for transfusions?   Healthy volunteers who are fully evaluated to make sure their blood is safe. This is blood bank blood. Transfusion therapy is the safest it has ever been in the practice of medicine. Before blood is taken from a donor, a complete history is taken to make sure that person has no history of diseases nor engages in risky social behavior (examples are intravenous drug use or sexual activity with multiple partners). The donor's travel history is screened to minimize risk of transmitting infections, such as malaria. The donated blood is tested for signs of infectious diseases, such as HIV and hepatitis. The blood is then tested to be sure it is compatible with you in order to minimize the chance of a transfusion reaction. If you or a relative donates blood, this is often done in anticipation of surgery and is not appropriate for emergency situations. It takes many days to process the donated blood. RISKS AND COMPLICATIONS Although transfusion therapy is very safe and saves many lives, the main dangers of transfusion include:   Getting an infectious disease.  Developing a transfusion reaction. This is an allergic reaction to something in the blood you were given. Every precaution is taken to prevent this. The decision to have a blood transfusion has been considered carefully by your caregiver before blood is given. Blood is not given unless the benefits outweigh the risks. AFTER THE TRANSFUSION  Right after receiving a blood transfusion, you will usually feel much better and more energetic. This is  especially true if your red blood cells have gotten low (anemic). The transfusion raises the level of the red blood cells which carry oxygen, and this usually causes an energy increase.  The nurse administering the transfusion will monitor you carefully for complications. HOME CARE INSTRUCTIONS  No special instructions are needed after a transfusion. You may find your energy is better. Speak with your caregiver about any limitations on activity for underlying diseases you may have. SEEK MEDICAL CARE IF:   Your condition is not improving after your transfusion.  You develop redness or irritation at the intravenous (IV) site. SEEK IMMEDIATE MEDICAL CARE IF:  Any of the following symptoms occur over the next 12 hours:  Shaking chills.  You have a temperature  by mouth above 102 F (38.9 C), not controlled by medicine.  Chest, back, or muscle pain.  People around you feel you are not acting correctly or are confused.  Shortness of breath or difficulty breathing.  Dizziness and fainting.  You get a rash or develop hives.  You have a decrease in urine output.  Your urine turns a dark color or changes to pink, red, or brown. Any of the following symptoms occur over the next 10 days:  You have a temperature by mouth above 102 F (38.9 C), not controlled by medicine.  Shortness of breath.  Weakness after normal activity.  The white part of the eye turns yellow (jaundice).  You have a decrease in the amount of urine or are urinating less often.  Your urine turns a dark color or changes to pink, red, or brown. Document Released: 08/06/2000 Document Revised: 11/01/2011 Document Reviewed: 03/25/2008 [facility] Patient Information 2014 [place], [place].  _______________________________________________________________________

## 2017-02-09 NOTE — Progress Notes (Signed)
LOV/ cardiology clearance Harwani MD 12-27-16 on chart   Stress test 07-14-16 on chart  CBC, BMP 01-19-17 epic

## 2017-02-09 NOTE — Telephone Encounter (Signed)
Returned call to patient. Left message advising her that we received a cardiac clearance letter from Dr. Zenia Resides office.  Advised to call for any questions or concerns.

## 2017-02-10 ENCOUNTER — Encounter (INDEPENDENT_AMBULATORY_CARE_PROVIDER_SITE_OTHER): Payer: Self-pay

## 2017-02-10 ENCOUNTER — Encounter (HOSPITAL_COMMUNITY)
Admission: RE | Admit: 2017-02-10 | Discharge: 2017-02-10 | Disposition: A | Discharge status: Home / Self Care | Payer: BC Managed Care – PPO | Source: Ambulatory Visit | Attending: Gynecologic Oncology | Admitting: Gynecologic Oncology

## 2017-02-10 ENCOUNTER — Encounter (HOSPITAL_COMMUNITY): Payer: Self-pay

## 2017-02-10 DIAGNOSIS — N8501 Benign endometrial hyperplasia: Secondary | ICD-10-CM | POA: Diagnosis not present

## 2017-02-10 DIAGNOSIS — Z01812 Encounter for preprocedural laboratory examination: Secondary | ICD-10-CM | POA: Diagnosis present

## 2017-02-10 LAB — URINALYSIS, ROUTINE W REFLEX MICROSCOPIC
BILIRUBIN URINE: NEGATIVE
Glucose, UA: NEGATIVE mg/dL
HGB URINE DIPSTICK: NEGATIVE
Ketones, ur: NEGATIVE mg/dL
Leukocytes, UA: NEGATIVE
Nitrite: NEGATIVE
PH: 6 (ref 5.0–8.0)
Protein, ur: NEGATIVE mg/dL
SPECIFIC GRAVITY, URINE: 1.016 (ref 1.005–1.030)

## 2017-02-10 LAB — GLUCOSE, CAPILLARY: GLUCOSE-CAPILLARY: 115 mg/dL — AB (ref 65–99)

## 2017-02-10 LAB — ABO/RH: ABO/RH(D): A POS

## 2017-02-10 NOTE — Progress Notes (Signed)
EKG 06-28-16 on chart

## 2017-02-10 NOTE — Progress Notes (Signed)
Per patient request, RN called and spoke to front desk at [facility] MD office  and requested EKG that patient states was done recently

## 2017-02-11 LAB — HEMOGLOBIN A1C
Hgb A1c MFr Bld: 6.3 % — ABNORMAL HIGH (ref 4.8–5.6)
MEAN PLASMA GLUCOSE: 134 mg/dL

## 2017-02-15 ENCOUNTER — Encounter (HOSPITAL_COMMUNITY)
Admission: RE | Disposition: A | Discharge status: Home / Self Care | Payer: Self-pay | Source: Ambulatory Visit | Attending: Gynecologic Oncology

## 2017-02-15 ENCOUNTER — Encounter (HOSPITAL_COMMUNITY): Payer: Self-pay | Admitting: Anesthesiology

## 2017-02-15 ENCOUNTER — Telehealth: Payer: Self-pay | Admitting: *Deleted

## 2017-02-15 ENCOUNTER — Ambulatory Visit (HOSPITAL_COMMUNITY)
Admission: RE | Admit: 2017-02-15 | Discharge: 2017-02-16 | Disposition: A | Discharge status: Home / Self Care | Payer: BC Managed Care – PPO | Source: Ambulatory Visit | Attending: Gynecologic Oncology | Admitting: Gynecologic Oncology

## 2017-02-15 ENCOUNTER — Ambulatory Visit (HOSPITAL_COMMUNITY): Payer: BC Managed Care – PPO | Admitting: Anesthesiology

## 2017-02-15 DIAGNOSIS — Z7982 Long term (current) use of aspirin: Secondary | ICD-10-CM | POA: Diagnosis not present

## 2017-02-15 DIAGNOSIS — Z6841 Body Mass Index (BMI) 40.0 and over, adult: Secondary | ICD-10-CM | POA: Insufficient documentation

## 2017-02-15 DIAGNOSIS — N888 Other specified noninflammatory disorders of cervix uteri: Secondary | ICD-10-CM | POA: Insufficient documentation

## 2017-02-15 DIAGNOSIS — N950 Postmenopausal bleeding: Secondary | ICD-10-CM | POA: Diagnosis not present

## 2017-02-15 DIAGNOSIS — N9489 Other specified conditions associated with female genital organs and menstrual cycle: Secondary | ICD-10-CM | POA: Diagnosis not present

## 2017-02-15 DIAGNOSIS — N840 Polyp of corpus uteri: Secondary | ICD-10-CM | POA: Insufficient documentation

## 2017-02-15 DIAGNOSIS — N8502 Endometrial intraepithelial neoplasia [EIN]: Secondary | ICD-10-CM

## 2017-02-15 DIAGNOSIS — I10 Essential (primary) hypertension: Secondary | ICD-10-CM | POA: Insufficient documentation

## 2017-02-15 DIAGNOSIS — D259 Leiomyoma of uterus, unspecified: Secondary | ICD-10-CM | POA: Diagnosis not present

## 2017-02-15 DIAGNOSIS — Z7984 Long term (current) use of oral hypoglycemic drugs: Secondary | ICD-10-CM | POA: Insufficient documentation

## 2017-02-15 DIAGNOSIS — L918 Other hypertrophic disorders of the skin: Secondary | ICD-10-CM | POA: Diagnosis not present

## 2017-02-15 DIAGNOSIS — F419 Anxiety disorder, unspecified: Secondary | ICD-10-CM | POA: Diagnosis not present

## 2017-02-15 DIAGNOSIS — Z880 Allergy status to penicillin: Secondary | ICD-10-CM | POA: Insufficient documentation

## 2017-02-15 DIAGNOSIS — E119 Type 2 diabetes mellitus without complications: Secondary | ICD-10-CM | POA: Diagnosis not present

## 2017-02-15 HISTORY — PX: SENTINEL NODE BIOPSY: SHX6608

## 2017-02-15 HISTORY — PX: ROBOTIC ASSISTED TOTAL HYSTERECTOMY WITH BILATERAL SALPINGO OOPHERECTOMY: SHX6086

## 2017-02-15 LAB — TYPE AND SCREEN
ABO/RH(D): A POS
ANTIBODY SCREEN: NEGATIVE

## 2017-02-15 LAB — GLUCOSE, CAPILLARY: GLUCOSE-CAPILLARY: 102 mg/dL — AB (ref 65–99)

## 2017-02-15 SURGERY — HYSTERECTOMY, TOTAL, ROBOT-ASSISTED, LAPAROSCOPIC, WITH BILATERAL SALPINGO-OOPHORECTOMY
Anesthesia: General

## 2017-02-15 MED ORDER — ASPIRIN EC 81 MG PO TBEC
81.0000 mg | DELAYED_RELEASE_TABLET | Freq: Every day | ORAL | Status: DC
  Administered 2017-02-16: 81 mg via ORAL
  Filled 2017-02-15: qty 1

## 2017-02-15 MED ORDER — MIDAZOLAM HCL 2 MG/2ML IJ SOLN
INTRAMUSCULAR | Status: AC
  Filled 2017-02-15: qty 2

## 2017-02-15 MED ORDER — FENTANYL CITRATE (PF) 250 MCG/5ML IJ SOLN
INTRAMUSCULAR | Status: AC
  Filled 2017-02-15: qty 5

## 2017-02-15 MED ORDER — PROPOFOL 10 MG/ML IV BOLUS
INTRAVENOUS | Status: DC | PRN
  Administered 2017-02-15: 150 mg via INTRAVENOUS

## 2017-02-15 MED ORDER — FENTANYL CITRATE (PF) 250 MCG/5ML IJ SOLN
INTRAMUSCULAR | Status: DC | PRN
  Administered 2017-02-15 (×5): 50 ug via INTRAVENOUS

## 2017-02-15 MED ORDER — HYDROMORPHONE HCL 1 MG/ML IJ SOLN
INTRAMUSCULAR | Status: DC | PRN
  Administered 2017-02-15 (×2): 0.5 mg via INTRAVENOUS
  Administered 2017-02-15: 1 mg via INTRAVENOUS

## 2017-02-15 MED ORDER — DEXAMETHASONE SODIUM PHOSPHATE 10 MG/ML IJ SOLN
INTRAMUSCULAR | Status: DC | PRN
  Administered 2017-02-15: 10 mg via INTRAVENOUS

## 2017-02-15 MED ORDER — LIDOCAINE 2% (20 MG/ML) 5 ML SYRINGE
INTRAMUSCULAR | Status: AC
  Filled 2017-02-15: qty 5

## 2017-02-15 MED ORDER — LACTATED RINGERS IV SOLN
INTRAVENOUS | Status: AC | PRN
  Administered 2017-02-15: 1000 mL

## 2017-02-15 MED ORDER — MEPERIDINE HCL 50 MG/ML IJ SOLN: 6.2500 mg | INTRAMUSCULAR | Status: DC | PRN

## 2017-02-15 MED ORDER — GABAPENTIN 300 MG PO CAPS
300.0000 mg | ORAL_CAPSULE | Freq: Every day | ORAL | Status: AC
  Administered 2017-02-15: 300 mg via ORAL
  Filled 2017-02-15: qty 1

## 2017-02-15 MED ORDER — LOSARTAN POTASSIUM 50 MG PO TABS
50.0000 mg | ORAL_TABLET | Freq: Every day | ORAL | Status: DC
  Filled 2017-02-15: qty 1

## 2017-02-15 MED ORDER — ROCURONIUM BROMIDE 50 MG/5ML IV SOSY
PREFILLED_SYRINGE | INTRAVENOUS | Status: AC
  Filled 2017-02-15: qty 10

## 2017-02-15 MED ORDER — OXYCODONE-ACETAMINOPHEN 5-325 MG PO TABS
1.0000 | ORAL_TABLET | ORAL | Status: DC | PRN
  Administered 2017-02-15 – 2017-02-16 (×2): 1 via ORAL
  Filled 2017-02-15 (×2): qty 1

## 2017-02-15 MED ORDER — LACTATED RINGERS IV SOLN
INTRAVENOUS | Status: DC
  Administered 2017-02-15 (×3): via INTRAVENOUS

## 2017-02-15 MED ORDER — ENOXAPARIN SODIUM 40 MG/0.4ML ~~LOC~~ SOLN
40.0000 mg | SUBCUTANEOUS | Status: AC
  Administered 2017-02-15: 40 mg via SUBCUTANEOUS
  Filled 2017-02-15: qty 0.4

## 2017-02-15 MED ORDER — HYDROMORPHONE HCL 1 MG/ML IJ SOLN
0.2500 mg | INTRAMUSCULAR | Status: DC | PRN
  Administered 2017-02-15 (×2): 0.5 mg via INTRAVENOUS

## 2017-02-15 MED ORDER — HYDROMORPHONE HCL 1 MG/ML IJ SOLN
INTRAMUSCULAR | Status: AC
  Filled 2017-02-15: qty 0.5

## 2017-02-15 MED ORDER — BUSPIRONE HCL 5 MG PO TABS
7.5000 mg | ORAL_TABLET | Freq: Two times a day (BID) | ORAL | Status: DC
  Administered 2017-02-15 – 2017-02-16 (×2): 7.5 mg via ORAL
  Filled 2017-02-15 (×2): qty 2

## 2017-02-15 MED ORDER — INSULIN ASPART 100 UNIT/ML ~~LOC~~ SOLN: 0.0000 [IU] | SUBCUTANEOUS | Status: DC

## 2017-02-15 MED ORDER — EPHEDRINE SULFATE-NACL 50-0.9 MG/10ML-% IV SOSY
PREFILLED_SYRINGE | INTRAVENOUS | Status: DC | PRN
  Administered 2017-02-15: 10 mg via INTRAVENOUS

## 2017-02-15 MED ORDER — ONDANSETRON HCL 4 MG PO TABS: 4.0000 mg | ORAL_TABLET | Freq: Four times a day (QID) | ORAL | Status: DC | PRN

## 2017-02-15 MED ORDER — HYDROMORPHONE HCL 1 MG/ML IJ SOLN: 0.2000 mg | INTRAMUSCULAR | Status: DC | PRN

## 2017-02-15 MED ORDER — STERILE WATER FOR INJECTION IJ SOLN
INTRAMUSCULAR | Status: AC
  Filled 2017-02-15: qty 10

## 2017-02-15 MED ORDER — STERILE WATER FOR INJECTION IJ SOLN
INTRAMUSCULAR | Status: DC | PRN
  Administered 2017-02-15: 4 mL

## 2017-02-15 MED ORDER — CLINDAMYCIN PHOSPHATE 900 MG/50ML IV SOLN
900.0000 mg | INTRAVENOUS | Status: AC
  Administered 2017-02-15: 900 mg via INTRAVENOUS
  Filled 2017-02-15: qty 50

## 2017-02-15 MED ORDER — MIDAZOLAM HCL 5 MG/5ML IJ SOLN
INTRAMUSCULAR | Status: DC | PRN
  Administered 2017-02-15: 2 mg via INTRAVENOUS

## 2017-02-15 MED ORDER — ONDANSETRON HCL 4 MG/2ML IJ SOLN: 4.0000 mg | Freq: Four times a day (QID) | INTRAMUSCULAR | Status: DC | PRN

## 2017-02-15 MED ORDER — OXYCODONE HCL 5 MG/5ML PO SOLN
5.0000 mg | Freq: Once | ORAL | Status: DC | PRN
Start: 2017-02-15 — End: 2017-02-15
  Filled 2017-02-15: qty 5

## 2017-02-15 MED ORDER — TRAMADOL HCL 50 MG PO TABS: 100.0000 mg | ORAL_TABLET | Freq: Two times a day (BID) | ORAL | Status: DC | PRN

## 2017-02-15 MED ORDER — PROMETHAZINE HCL 25 MG/ML IJ SOLN: 6.2500 mg | INTRAMUSCULAR | Status: DC | PRN

## 2017-02-15 MED ORDER — HYDROMORPHONE HCL 2 MG/ML IJ SOLN
INTRAMUSCULAR | Status: AC
  Filled 2017-02-15: qty 1

## 2017-02-15 MED ORDER — GLYCOPYRROLATE 0.2 MG/ML IV SOSY
PREFILLED_SYRINGE | INTRAVENOUS | Status: DC | PRN
  Administered 2017-02-15: .2 mg via INTRAVENOUS

## 2017-02-15 MED ORDER — OXYCODONE HCL 5 MG PO TABS: 5.0000 mg | ORAL_TABLET | Freq: Once | ORAL | Status: DC | PRN

## 2017-02-15 MED ORDER — IBUPROFEN 800 MG PO TABS
800.0000 mg | ORAL_TABLET | Freq: Three times a day (TID) | ORAL | Status: DC | PRN
  Administered 2017-02-16: 800 mg via ORAL
  Filled 2017-02-15: qty 1

## 2017-02-15 MED ORDER — ENOXAPARIN SODIUM 40 MG/0.4ML ~~LOC~~ SOLN
40.0000 mg | SUBCUTANEOUS | Status: DC
  Administered 2017-02-16: 40 mg via SUBCUTANEOUS
  Filled 2017-02-15: qty 0.4

## 2017-02-15 MED ORDER — SUGAMMADEX SODIUM 200 MG/2ML IV SOLN
INTRAVENOUS | Status: DC | PRN
  Administered 2017-02-15: 300 mg via INTRAVENOUS

## 2017-02-15 MED ORDER — CIPROFLOXACIN IN D5W 400 MG/200ML IV SOLN
400.0000 mg | INTRAVENOUS | Status: AC
Start: 2017-02-15 — End: 2017-02-15
  Administered 2017-02-15: 400 mg via INTRAVENOUS
  Filled 2017-02-15: qty 200

## 2017-02-15 MED ORDER — KCL IN DEXTROSE-NACL 20-5-0.45 MEQ/L-%-% IV SOLN
INTRAVENOUS | Status: DC
  Administered 2017-02-15: 1000 mL via INTRAVENOUS
  Filled 2017-02-15 (×2): qty 1000

## 2017-02-15 MED ORDER — ROCURONIUM BROMIDE 10 MG/ML (PF) SYRINGE
PREFILLED_SYRINGE | INTRAVENOUS | Status: DC | PRN
  Administered 2017-02-15: 20 mg via INTRAVENOUS
  Administered 2017-02-15: 30 mg via INTRAVENOUS
  Administered 2017-02-15: 50 mg via INTRAVENOUS

## 2017-02-15 MED ORDER — PROPOFOL 10 MG/ML IV BOLUS
INTRAVENOUS | Status: AC
  Filled 2017-02-15: qty 20

## 2017-02-15 MED ORDER — LIDOCAINE 2% (20 MG/ML) 5 ML SYRINGE
INTRAMUSCULAR | Status: DC | PRN
  Administered 2017-02-15: 80 mg via INTRAVENOUS
  Administered 2017-02-15 (×2): 50 mg via INTRAVENOUS

## 2017-02-15 MED ORDER — ONDANSETRON HCL 4 MG/2ML IJ SOLN
INTRAMUSCULAR | Status: DC | PRN
  Administered 2017-02-15: 4 mg via INTRAVENOUS

## 2017-02-15 MED ORDER — STERILE WATER FOR IRRIGATION IR SOLN
Status: DC | PRN
  Administered 2017-02-15: 1000 mL

## 2017-02-15 SURGICAL SUPPLY — 65 items
ADH SKN CLS APL DERMABOND .7 (GAUZE/BANDAGES/DRESSINGS) ×1
AGENT HMST KT MTR STRL THRMB (HEMOSTASIS)
APL ESCP 34 STRL LF DISP (HEMOSTASIS)
APPLICATOR SURGIFLO ENDO (HEMOSTASIS) IMPLANT
BAG LAPAROSCOPIC 12 15 PORT 16 (BASKET) IMPLANT
BAG RETRIEVAL 12/15 (BASKET)
BAG SPEC RTRVL LRG 6X4 10 (ENDOMECHANICALS)
CHLORAPREP W/TINT 26ML (MISCELLANEOUS) ×2 IMPLANT
COVER BACK TABLE 60X90IN (DRAPES) ×2 IMPLANT
COVER SURGICAL LIGHT HANDLE (MISCELLANEOUS) ×2 IMPLANT
COVER TIP SHEARS 8 DVNC (MISCELLANEOUS) ×1 IMPLANT
COVER TIP SHEARS 8MM DA VINCI (MISCELLANEOUS) ×1
DERMABOND ADVANCED (GAUZE/BANDAGES/DRESSINGS) ×1
DERMABOND ADVANCED .7 DNX12 (GAUZE/BANDAGES/DRESSINGS) ×1 IMPLANT
DRAPE ARM DVNC X/XI (DISPOSABLE) ×4 IMPLANT
DRAPE COLUMN DVNC XI (DISPOSABLE) ×1 IMPLANT
DRAPE DA VINCI XI ARM (DISPOSABLE) ×4
DRAPE DA VINCI XI COLUMN (DISPOSABLE) ×1
DRAPE SHEET LG 3/4 BI-LAMINATE (DRAPES) ×4 IMPLANT
DRAPE SURG IRRIG POUCH 19X23 (DRAPES) ×2 IMPLANT
DRSG TEGADERM 8X12 (GAUZE/BANDAGES/DRESSINGS) ×2 IMPLANT
ELECT REM PT RETURN 15FT ADLT (MISCELLANEOUS) ×2 IMPLANT
GLOVE BIO SURGEON STRL SZ 6 (GLOVE) ×8 IMPLANT
GLOVE BIO SURGEON STRL SZ 6.5 (GLOVE) ×4 IMPLANT
GOWN STRL REUS W/ TWL LRG LVL3 (GOWN DISPOSABLE) ×2 IMPLANT
GOWN STRL REUS W/TWL LRG LVL3 (GOWN DISPOSABLE) ×4
HOLDER FOLEY CATH W/STRAP (MISCELLANEOUS) ×2 IMPLANT
IRRIG SUCT STRYKERFLOW 2 WTIP (MISCELLANEOUS) ×2
IRRIGATION SUCT STRKRFLW 2 WTP (MISCELLANEOUS) ×1 IMPLANT
KIT BASIN OR (CUSTOM PROCEDURE TRAY) ×2 IMPLANT
KIT PROCEDURE DA VINCI SI (MISCELLANEOUS)
KIT PROCEDURE DVNC SI (MISCELLANEOUS) IMPLANT
MANIPULATOR UTERINE 4.5 ZUMI (MISCELLANEOUS) ×2 IMPLANT
MARKER SKIN DUAL TIP RULER LAB (MISCELLANEOUS) ×2 IMPLANT
NDL SAFETY ECLIPSE 18X1.5 (NEEDLE) ×1 IMPLANT
NEEDLE HYPO 18GX1.5 SHARP (NEEDLE) ×2
NEEDLE SPNL 18GX3.5 QUINCKE PK (NEEDLE) ×2 IMPLANT
OBTURATOR OPTICAL STANDARD 8MM (TROCAR) ×1
OBTURATOR OPTICAL STND 8 DVNC (TROCAR) ×1
OBTURATOR OPTICALSTD 8 DVNC (TROCAR) ×1 IMPLANT
OCCLUDER COLPOPNEUMO (BALLOONS) ×2 IMPLANT
PAD POSITIONING PINK XL (MISCELLANEOUS) ×2 IMPLANT
PORT ACCESS TROCAR AIRSEAL 12 (TROCAR) ×1 IMPLANT
PORT ACCESS TROCAR AIRSEAL 5M (TROCAR) ×1
POUCH SPECIMEN RETRIEVAL 10MM (ENDOMECHANICALS) IMPLANT
SEAL CANN UNIV 5-8 DVNC XI (MISCELLANEOUS) ×4 IMPLANT
SEAL XI 5MM-8MM UNIVERSAL (MISCELLANEOUS) ×4
SET TRI-LUMEN FLTR TB AIRSEAL (TUBING) ×2 IMPLANT
SHEET LAVH (DRAPES) ×2 IMPLANT
SOLUTION ELECTROLUBE (MISCELLANEOUS) ×2 IMPLANT
SURGIFLO W/THROMBIN 8M KIT (HEMOSTASIS) IMPLANT
SUT MNCRL AB 4-0 PS2 18 (SUTURE) ×4 IMPLANT
SUT VIC AB 0 CT1 27 (SUTURE) ×4
SUT VIC AB 0 CT1 27XBRD ANTBC (SUTURE) ×2 IMPLANT
SUT VIC AB 4-0 PS2 27 (SUTURE) ×2 IMPLANT
SYR 10ML LL (SYRINGE) ×2 IMPLANT
SYR 50ML LL SCALE MARK (SYRINGE) ×2 IMPLANT
TOWEL OR 17X26 10 PK STRL BLUE (TOWEL DISPOSABLE) ×4 IMPLANT
TOWEL OR NON WOVEN STRL DISP B (DISPOSABLE) ×2 IMPLANT
TRAP SPECIMEN MUCOUS 40CC (MISCELLANEOUS) IMPLANT
TRAY FOLEY W/METER SILVER 16FR (SET/KITS/TRAYS/PACK) ×2 IMPLANT
TRAY LAPAROSCOPIC (CUSTOM PROCEDURE TRAY) ×2 IMPLANT
TROCAR BLADELESS OPT 5 100 (ENDOMECHANICALS) ×2 IMPLANT
UNDERPAD 30X30 (UNDERPADS AND DIAPERS) ×4 IMPLANT
WATER STERILE IRR 1000ML POUR (IV SOLUTION) ×4 IMPLANT

## 2017-02-15 NOTE — Interval H&P Note (Signed)
History and Physical Interval Note:  02/15/2017 10:37 AM  Stacie Marquez  has presented today for surgery, with the diagnosis of [place]  The various methods of treatment have been discussed with the patient and family. After consideration of risks, benefits and other options for treatment, the patient has consented to  Procedure(s): XI ROBOTIC ASSISTED TOTAL HYSTERECTOMY WITH BILATERAL SALPINGO OOPHORECTOMY (N/A) SENTINEL NODE BIOPSY AND LEFT THIGH BIOPSY (N/A) as a surgical intervention .  The patient's history has been reviewed, patient examined, no change in status, stable for surgery.  I have reviewed the patient's chart and labs.  Questions were answered to the patient's satisfaction.     [name]

## 2017-02-15 NOTE — Op Note (Signed)
OPERATIVE NOTE 02/15/17  Surgeon: [name]   Assistants: Dr Lahoma Crocker (an MD assistant was necessary for tissue manipulation, management of robotic instrumentation, retraction and positioning due to the complexity of the case and hospital policies).   Anesthesia: General endotracheal anesthesia  ASA Class: 3   Pre-operative Diagnosis: complex atypical hyperplasia, extreme morbid obesity (BMI  >50kg/m2), left thigh skin tag  Post-operative Diagnosis: same  Operation: Robotic-assisted laparoscopic total hysterectomy with bilateral salpingoophorectomy, SLN mapping, removal of left thigh skin tag  Surgeon: [name]  Assistant Surgeon: Lahoma Crocker MD  Anesthesia: GET  Urine Output: 400  Operative Findings:  : 8cm uterus with fibroids, normal ovaries. Failed mapping bilaterally. Lymphadenectomy not performed due to extreme abdominal adiposity and retroperiteal fat preventing adequate visualization of lymphatic basins. 2cm skin tag medial left thigh. Extreme obesity increased the duration of the procedure for additional steps in patient positioning and exposure.   Estimated Blood Loss:  less than 50 mL      Total IV Fluids: 600 ml         Specimens: left thigh skin tag, uterus, cervix bilateral tubes         Complications:  None; patient tolerated the procedure well.         Disposition: PACU - hemodynamically stable.  Procedure Details  The patient was seen in the Holding Room. The risks, benefits, complications, treatment options, and expected outcomes were discussed with the patient.  The patient concurred with the proposed plan, giving informed consent.  The site of surgery properly noted/marked. The patient was identified as Stacie Marquez and the procedure verified as a Robotic-assisted hysterectomy with bilateral salpingo oophorectomy. A Time Out was held and the above information confirmed.  After induction of anesthesia, the patient was  draped and prepped in the usual sterile manner. Pt was placed in supine position after anesthesia and draped and prepped in the usual sterile manner. The abdominal drape was placed after the CholoraPrep had been allowed to dry for 3 minutes.  Her arms were tucked to her side with all appropriate precautions.  The shoulders were stabilized with padded shoulder blocks applied to the acromium processes.  The patient was placed in the semi-lithotomy position in [place].  The perineum and left thigh was prepped with Betadine. The patient was then prepped. Foley catheter was placed.   A 15 blade scalpel was used to excise the skin tag at its base. The site was closed with interrupted 4-0 vicryl sutures. A sterile speculum was placed in the vagina.  The cervix was grasped with a single-tooth tenaculum and dilated with Kennon Rounds dilators. 1mg  total of ICG was injected into the cervical stroma at 2 and 9 o'clock at a 10mm depth (concentration 0..5mg /ml).   The ZUMI uterine manipulator with a medium colpotomizer ring was placed without difficulty.  A pneum occluder balloon was placed over the manipulator.  OG tube placement was confirmed and to suction.   Next, a 5 mm skin incision was made 1 cm below the subcostal margin in the midclavicular line.  The 5 mm Optiview port and scope was used for direct entry.  Opening pressure was under 10 mm CO2.  The abdomen was insufflated and the findings were noted as above.   At this point and all points during the procedure, the patient's intra-abdominal pressure did not exceed 15 mmHg. Next, a 10 mm skin incision was made in the umbilicus and a right and left port was placed about  10 cm lateral to the robot port on the right and left side.  A fourth arm was placed in the left lower quadrant 2 cm above and superior and medial to the anterior superior iliac spine.  All ports were placed under direct visualization.  Adhesiolysis with the scissors was used to separte omentum from the  midline near the umbilical port site. The patient was placed in steep Trendelenburg.  Bowel was folded away into the upper abdomen.  The robot was docked in the normal manner.  The right and left peritoneum were opened parallel to the IP ligament to open the retroperitoneal spaces bilaterally. The SLN mapping was performed in bilateral pelvic basins. The para rectal and paravesical spaces were opened up. No lymphatic channels were identified travelling to sentinel lymph node's. Due to extreme adiposity and no preoperative diagnosis of invasive malignancy, lymphadenectomy was not performed.   The hysterectomy was started after the round ligament on the right side was incised and the retroperitoneum was entered and the pararectal space was developed.  The ureter was noted to be on the medial leaf of the broad ligament.  The peritoneum above the ureter was incised and stretched and the infundibulopelvic ligament was skeletonized, cauterized and cut.  The posterior peritoneum was taken down to the level of the KOH ring.  The anterior peritoneum was also taken down.  The bladder flap was created to the level of the KOH ring.  The uterine artery on the right side was skeletonized, cauterized and cut in the normal manner.  A similar procedure was performed on the left.  The colpotomy was made and the uterus, cervix, bilateral ovaries and tubes were amputated and delivered through the vagina.  Pedicles were inspected and excellent hemostasis was achieved.    The colpotomy at the vaginal cuff was closed with Vicryl on a CT1 needle in a figure of 8 manner.  Irrigation was used and excellent hemostasis was achieved.  At this point in the procedure was completed.  Robotic instruments were removed under direct visulaization.  The robot was undocked. The 10 mm ports were closed with Vicryl on a UR-5 needle and the fascia was closed with 0 Vicryl on a UR-5 needle.  The skin was closed with 4-0 Vicryl in a subcuticular  manner.  Dermabond was applied.  Sponge, lap and needle counts correct x 2.  The patient was taken to the recovery room in stable condition.  The vagina was swabbed with  minimal bleeding noted.   All instrument and needle counts were correct x  3.   An additional 45 minutes of operating room time was necessary due to the extreme morbid obesity (BMI >50kg/m2) which required additional OR personnel for positioning and additional time and equipment to achieve visualization of the surgical field.  The patient was transferred to the recovery room in a stable condition.  [name], MD

## 2017-02-15 NOTE — Anesthesia Postprocedure Evaluation (Signed)
Anesthesia Post Note  Patient: Stacie Marquez  Procedure(s) Performed: Procedure(s) (LRB): XI ROBOTIC ASSISTED TOTAL HYSTERECTOMY WITH BILATERAL SALPINGO OOPHORECTOMY (N/A) SENTINEL NODE BIOPSY AND LEFT THIGH BIOPSY (N/A)     Patient location during evaluation: PACU Anesthesia Type: General Level of consciousness: sedated and patient cooperative Pain management: pain level controlled Vital Signs Assessment: post-procedure vital signs reviewed and stable Respiratory status: spontaneous breathing Cardiovascular status: stable Anesthetic complications: no    Last Vitals:  Vitals:   02/15/17 1720 02/15/17 1830  BP: 135/72 137/74  Pulse: (!) 49 (!) 51  Resp: 14 20  Temp: 36.4 C 36.1 C    Last Pain:  Vitals:   02/15/17 1830  TempSrc: Oral  PainSc:                  Nolon Nations

## 2017-02-15 NOTE — Transfer of Care (Signed)
Immediate Anesthesia Transfer of Care Note  Patient: Stacie Marquez  Procedure(s) Performed: Procedure(s): XI ROBOTIC ASSISTED TOTAL HYSTERECTOMY WITH BILATERAL SALPINGO OOPHORECTOMY (N/A) SENTINEL NODE BIOPSY AND LEFT THIGH BIOPSY (N/A)  Patient Location: PACU  Anesthesia Type:General  Level of Consciousness:  sedated, patient cooperative and responds to stimulation  Airway & Oxygen Therapy:Patient Spontanous Breathing and Patient connected to face mask oxgen  Post-op Assessment:  Report given to PACU RN and Post -op Vital signs reviewed and stable  Post vital signs:  Reviewed and stable  Last Vitals:  Vitals:   02/15/17 0855  BP: 139/78  Pulse: 75  Resp: 16  Temp: 57.8 C    Complications: No apparent anesthesia complications

## 2017-02-15 NOTE — H&P (View-Only) (Signed)
Consult Note: Gyn-Onc  Consult was requested by Dr. Dareen Piano for the evaluation of Stacie Marquez 66 y.o. female  CC:  Chief Complaint  Patient presents with  . Complex Atypical Endometrial Hyperplasia    Assessment/Plan:  Stacie Marquez  is a 66 y.o.  year old with complex atypical hyperplasia of the endometrium and morbid obesity.  I discussed the nature of this lesion and the fact that 40% coexist with invasive endometrial carcinoma. I discussed the two options of therapy including 1/ progesterone (I would recommend mirena IUD vs oral progesterone). 2/ surgery with hysterectomy, BSO and SLN biopsy. This would be the most definitive therapy, however carries with it increased risks of surgery including  bleeding, infection, damage to internal organs (such as bladder,ureters, bowels), blood clot, reoperation and rehospitalization. She is at increased risks given her morbid obesity. However, this option provides pathologic staging information if there is co-existent cancer.  After considering her options she is interested in surgery with a robotic assisted total hysterectomy, BSO, SLN biopsy. I recommended she discontinue ASA now until after surgery as it increases bleeding risk.  The patient has a left upper thigh skin tag and requests excision at the time of surgery. We will accommodate this.  HPI: Stacie Marquez is a 66 year old morbidly obese woman seen in consultation at the request of Dr Dareen Piano for [facility].  The patient reports that she was started on ASA in February 2018. She began experiencing postmenopausal bleeding. This prompted her to be seen by Dr Dareen Piano and an [place] was peformed on 12/29/16 which showed a 6.2x4.6x5.2cm uterus with a thickened endometrium.  A hysteroscopy with D&C was performed on 01/25/17 which showed an endoemtrial polyp with CAH and the curettage showed CAH.   She has a past history of 1 SVD and 1 cesarean. She has had a laparoscopic chylecystectomy in the  past. She had a cardiac stress test in 2017 which was unremarkable for blockage.  Current Meds:  Outpatient Encounter Prescriptions as of 02/02/2017  Medication Sig  . acetaminophen (TYLENOL) 500 MG tablet Take 500 mg by mouth every 6 (six) hours as needed for mild pain or moderate pain.  Marland Kitchen aspirin EC 81 MG tablet Take 81 mg by mouth daily.  . busPIRone (BUSPAR) 15 MG tablet Take 7.5 mg by mouth 2 (two) times daily.  . furosemide (LASIX) 40 MG tablet Take 40 mg by mouth as needed for edema.  Marland Kitchen losartan (COZAAR) 50 MG tablet Take 50 mg by mouth daily.  . metFORMIN (GLUCOPHAGE-XR) 500 MG 24 hr tablet Take 500 mg by mouth at bedtime.   No facility-administered encounter medications on file as of 02/02/2017.     Allergy:  Allergies  Allergen Reactions  . Penicillins Rash    Social Hx:   Social History   Social History  . Marital status: Single    Spouse name: N/A  . Number of children: N/A  . Years of education: N/A   Occupational History  . Not on file.   Social History Main Topics  . Smoking status: Never Smoker  . Smokeless tobacco: Never Used  . Alcohol use No  . Drug use: No  . Sexual activity: Not on file   Other Topics Concern  . Not on file   Social History Narrative  . No narrative on file    Past Surgical Hx:  Past Surgical History:  Procedure Laterality Date  . ACHILLES TENDON SURGERY    . CESAREAN SECTION    .  CHOLECYSTECTOMY  07/27/2012   Procedure: LAPAROSCOPIC CHOLECYSTECTOMY WITH INTRAOPERATIVE CHOLANGIOGRAM;  Surgeon: Romie Levee, MD;  Location: [facility] OR;  Service: General;  Laterality: N/A;  . COLONOSCOPY    . DILATATION & CURETTAGE/HYSTEROSCOPY WITH MYOSURE N/A 01/25/2017   Procedure: DILATATION & CURETTAGE/HYSTEROSCOPY WITH MYOSURE;  Surgeon: Levi Aland, MD;  Location: [facility] OR[facility];  Service: Gynecology;  Laterality: N/A;    Past Medical Hx:  Past Medical History:  Diagnosis Date  . Ankle swelling    bilateral  . Anxiety   . Diabetes  mellitus without complication (HCC)   . Hypertension   . Leg swelling    bilateral  . Plantar fasciitis    left foot  . Pre-diabetes   . Rosacea     Past Gynecological History:  SVD x 1 c/s x 1 No LMP recorded. Patient is postmenopausal.  Family Hx: History reviewed. No pertinent family history.  Review of Systems:  Constitutional  Feels well,    ENT Normal appearing ears and nares bilaterally Skin/Breast  No rash, sores, jaundice, itching, dryness, + skin tag left thigh Cardiovascular  No chest pain, shortness of breath, or edema  Pulmonary  No cough or wheeze.  Gastro Intestinal  No nausea, vomitting, or diarrhoea. No bright red blood per rectum, no abdominal pain, change in bowel movement, or constipation.  Genito Urinary  No frequency, urgency, dysuria, + postmenopausal bleeding Musculo Skeletal  No myalgia, arthralgia, joint swelling or pain  Neurologic  No weakness, numbness, change in gait,  Psychology  No depression, anxiety, insomnia.   Vitals:  Blood pressure (!) 135/54, pulse 68, temperature 97.7 F (36.5 C), resp. rate 20, height 5\' 4"  (1.626 m), weight 263 lb 12.8 oz (119.7 kg), SpO2 98 %.  Physical Exam: WD in NAD Neck  Supple NROM, without any enlargements.  Lymph Node Survey No cervical supraclavicular or inguinal adenopathy Cardiovascular  Pulse normal rate, regularity and rhythm. S1 and S2 normal.  Lungs  Clear to auscultation bilateraly, without wheezes/crackles/rhonchi. Good air movement.  Skin  No rash/lesions/breakdown  Psychiatry  Alert and oriented to person, place, and time  Abdomen  Normoactive bowel sounds, abdomen soft, non-tender and obese without evidence of hernia.  Back No CVA tenderness Genito Urinary  Vulva/vagina: Normal external female genitalia.   No lesions. No discharge or bleeding.  Bladder/urethra:  No lesions or masses, well supported bladder  Vagina: normal  Cervix: Normal appearing, no lesions.  Uterus:  Small,  mobile, no parametrial involvement or nodularity.  Adnexa: no palpable masses. Rectal  deferred.  Extremities  No bilateral cyanosis, clubbing or edema.   Quinn Axe, MD  02/02/2017, 10:51 AM

## 2017-02-15 NOTE — Telephone Encounter (Signed)
Per staff message scheduled the patient for post op appt. Patient will receive appt with discharge summary

## 2017-02-15 NOTE — Anesthesia Procedure Notes (Signed)
Procedure Name: Intubation Date/Time: 02/15/2017 11:20 AM Performed by: Xochil Shanker, Virgel Gess Pre-anesthesia Checklist: Patient identified, Emergency Drugs available, Suction available and Patient being monitored Patient Re-evaluated:Patient Re-evaluated prior to inductionOxygen Delivery Method: Circle System Utilized Preoxygenation: Pre-oxygenation with 100% oxygen Intubation Type: IV induction Ventilation: Mask ventilation without difficulty Laryngoscope Size: Mac and 3 Grade View: Grade I Tube type: Oral Tube size: 7.0 mm Number of attempts: 1 Airway Equipment and Method: Stylet and Oral airway Placement Confirmation: ETT inserted through vocal cords under direct vision,  positive ETCO2 and breath sounds checked- equal and bilateral Secured at: 21 cm Tube secured with: Tape Dental Injury: Teeth and Oropharynx as per pre-operative assessment  Comments: Intubation performed by Julianne Rice, SRNA

## 2017-02-15 NOTE — Anesthesia Preprocedure Evaluation (Addendum)
Anesthesia Evaluation  Patient identified by MRN, date of birth, ID band Patient awake    Reviewed: Allergy & Precautions, NPO status , Patient's Chart, lab work & pertinent test results  History of Anesthesia Complications Negative for: history of anesthetic complications  Airway Mallampati: II  TM Distance: >3 FB Neck ROM: Full    Dental no notable dental hx. (+) Dental Advisory Given   Pulmonary neg pulmonary ROS,    Pulmonary exam normal        Cardiovascular hypertension, Pt. on medications negative cardio ROS Normal cardiovascular exam     Neuro/Psych PSYCHIATRIC DISORDERS Anxiety negative neurological ROS     GI/Hepatic negative GI ROS, Neg liver ROS,   Endo/Other  diabetesMorbid obesity  Renal/GU negative Renal ROS  negative genitourinary   Musculoskeletal negative musculoskeletal ROS (+)   Abdominal   Peds negative pediatric ROS (+)  Hematology negative hematology ROS (+)   Anesthesia Other Findings   Reproductive/Obstetrics negative OB ROS                             Anesthesia Physical  Anesthesia Plan  ASA: III  Anesthesia Plan: General   Post-op Pain Management:    Induction: Intravenous  PONV Risk Score and Plan: 4 or greater and Ondansetron, Dexamethasone, Scopolamine patch - Pre-op, Treatment may vary due to age and Diphenhydramine  Airway Management Planned: LMA and Oral ETT  Additional Equipment:   Intra-op Plan:   Post-operative Plan: Extubation in OR  Informed Consent: I have reviewed the patients History and Physical, chart, labs and discussed the procedure including the risks, benefits and alternatives for the proposed anesthesia with the patient or authorized representative who has indicated his/her understanding and acceptance.   Dental advisory given  Plan Discussed with: CRNA, Anesthesiologist and Surgeon  Anesthesia Plan Comments:          Anesthesia Quick Evaluation

## 2017-02-16 DIAGNOSIS — N8502 Endometrial intraepithelial neoplasia [EIN]: Secondary | ICD-10-CM | POA: Diagnosis not present

## 2017-02-16 LAB — BASIC METABOLIC PANEL
Anion gap: 8 (ref 5–15)
BUN: 13 mg/dL (ref 6–20)
CALCIUM: 9 mg/dL (ref 8.9–10.3)
CHLORIDE: 105 mmol/L (ref 101–111)
CO2: 26 mmol/L (ref 22–32)
CREATININE: 0.83 mg/dL (ref 0.44–1.00)
GFR calc Af Amer: >60 mL/min (ref >60)
GFR calc non Af Amer: >60 mL/min (ref >60)
Glucose, Bld: 182 mg/dL — ABNORMAL HIGH (ref 65–99)
Potassium: 4 mmol/L (ref 3.5–5.1)
SODIUM: 139 mmol/L (ref 135–145)

## 2017-02-16 LAB — CBC
HCT: 36.8 % (ref 36.0–46.0)
HEMOGLOBIN: 12.5 g/dL (ref 12.0–15.0)
MCH: 29.7 pg (ref 26.0–34.0)
MCHC: 34 g/dL (ref 30.0–36.0)
MCV: 87.4 fL (ref 78.0–100.0)
Platelets: 178 10*3/uL (ref 150–400)
RBC: 4.21 MIL/uL (ref 3.87–5.11)
RDW: 13.7 % (ref 11.5–15.5)
WBC: 8.2 10*3/uL (ref 4.0–10.5)

## 2017-02-16 LAB — GLUCOSE, CAPILLARY
Glucose-Capillary: 167 mg/dL — ABNORMAL HIGH (ref 65–99)
Glucose-Capillary: 186 mg/dL — ABNORMAL HIGH (ref 65–99)

## 2017-02-16 MED ORDER — TRAMADOL HCL 50 MG PO TABS: 50.0000 mg | ORAL_TABLET | Freq: Four times a day (QID) | ORAL | 0 refills | Status: DC | PRN

## 2017-02-16 NOTE — Progress Notes (Signed)
Attempted to remove patient's foley,  Baloon was deflated and 10cc of Fluid removed, however nurse was unable to remove actual  foley catheter out without pain.  Therefore it was left indwelling  until later and reported to oncoming nurse iinform MD if unable to remove it after breakfast.

## 2017-02-16 NOTE — Discharge Summary (Signed)
Physician Discharge Summary  Patient ID: Stacie Marquez MRN: [id number] DOB/AGE: [id number] 68 y.o.  Admit date: 02/15/2017 Discharge date: 02/16/2017  Admission Diagnoses: Complex atypical endometrial hyperplasia  Discharge Diagnoses:  Principal Problem:   Complex atypical endometrial hyperplasia Active Problems:   Skin tag   Morbid obesity (HCC)   Discharged Condition:  The patient is in good condition and stable for discharge.   Hospital Course: On 02/15/2017, the patient underwent the following: Procedure(s): XI ROBOTIC ASSISTED TOTAL HYSTERECTOMY WITH BILATERAL SALPINGO OOPHORECTOMY SENTINEL NODE BIOPSY AND LEFT THIGH BIOPSY.   The postoperative course was uneventful.  She was discharged to home on postoperative day 1 tolerating a regular diet, ambulating, voiding, pain controlled, passing flatus.  Consults: None  Significant Diagnostic Studies: None  Treatments: surgery: see above  Discharge Exam: Blood pressure (!) 102/49, pulse 71, temperature 98 F (36.7 C), temperature source Oral, resp. rate 16, height 5\' 4"  (1.626 m), weight 264 lb (119.7 kg), SpO2 96 %. General appearance: alert, cooperative and no distress Resp: clear to auscultation bilaterally Cardio: regular rate and rhythm, S1, S2 normal, no murmur, click, rub or gallop GI: soft, non-tender; bowel sounds normal; no masses,  no organomegaly and abdomen morbidly obese Extremities: mild BLE edema, more on the left (per pt not a new finding, takes lasix for this) Incision/Wound: lap sites to the abdomen with dermabond without erythema or drainage.  Left thigh biopsy site without erythema or drainage  Disposition: 01-Home or Self Care  Discharge Instructions    Call MD for:  difficulty breathing, headache or visual disturbances    Complete by:  As directed    Call MD for:  extreme fatigue    Complete by:  As directed    Call MD for:  hives    Complete by:  As directed    Call MD for:  persistant dizziness or  light-headedness    Complete by:  As directed    Call MD for:  persistant nausea and vomiting    Complete by:  As directed    Call MD for:  redness, tenderness, or signs of infection (pain, swelling, redness, odor or green/yellow discharge around incision site)    Complete by:  As directed    Call MD for:  severe uncontrolled pain    Complete by:  As directed    Call MD for:  temperature >100.4    Complete by:  As directed    Diet - low sodium heart healthy    Complete by:  As directed    Driving Restrictions    Complete by:  As directed    No driving for 1 week.  Do not take narcotics and drive.   Increase activity slowly    Complete by:  As directed    Lifting restrictions    Complete by:  As directed    No lifting greater than 10 lbs.   Sexual Activity Restrictions    Complete by:  As directed    No sexual activity, nothing in the vagina, for 8 weeks.     Allergies as of 02/16/2017      Reactions   Penicillins Rash      Medication List    TAKE these medications   acetaminophen 500 MG tablet Commonly known as:  TYLENOL Take 500 mg by mouth every 6 (six) hours as needed for mild pain or moderate pain.   aspirin EC 81 MG tablet Take 81 mg by mouth daily.   busPIRone 15 MG tablet  Commonly known as:  BUSPAR Take 7.5 mg by mouth 2 (two) times daily.   furosemide 40 MG tablet Commonly known as:  LASIX Take 40 mg by mouth as needed for edema.   losartan 50 MG tablet Commonly known as:  COZAAR Take 50 mg by mouth daily.   metFORMIN 500 MG 24 hr tablet Commonly known as:  GLUCOPHAGE-XR Take 500 mg by mouth at bedtime.   traMADol 50 MG tablet Commonly known as:  ULTRAM Take 1 tablet (50 mg total) by mouth every 6 (six) hours as needed.        Greater than thirty minutes were spend for face to face discharge instructions and discharge orders/summary in EPIC.   Signed: Cordale Manera DEAL 02/16/2017, 9:35 AM

## 2017-02-16 NOTE — Discharge Instructions (Signed)
02/15/2017  Return to work: 4 weeks  Activity: 1. Be up and out of the bed during the day.  Take a nap if needed.  You may walk up steps but be careful and use the hand rail.  Stair climbing will tire you more than you think, you may need to stop part way and rest.   2. No lifting or straining for 6 weeks.  3. No driving for 2 weeks.  Do Not drive if you are taking narcotic pain medicine.  4. Shower daily.  Use soap and water on your incision and pat dry; don't rub.   5. No sexual activity and nothing in the vagina for 8 weeks.  Medications:  - Take ibuprofen and tylenol first line for pain control. Take these regularly (every 6 hours) to decrease the build up of pain.  - If necessary, for severe pain not relieved by ibuprofen, take percocet.  - While taking percocet you should take sennakot every night to reduce the likelihood of constipation. If this causes diarrhea, stop its use.  Diet: 1. Low sodium Heart Healthy Diet is recommended.  2. It is safe to use a laxative if you have difficulty moving your bowels.   Wound Care: 1. Keep clean and dry.  Shower daily.  Reasons to call the Doctor:   Fever - Oral temperature greater than 100.4 degrees Fahrenheit  Foul-smelling vaginal discharge  Difficulty urinating  Nausea and vomiting  Increased pain at the site of the incision that is unrelieved with pain medicine.  Difficulty breathing with or without chest pain  New calf pain especially if only on one side  Sudden, continuing increased vaginal bleeding with or without clots.   Follow-up: 1. See Everitt Amber in 3-4 weeks.  Contacts: For questions or concerns you should contact:  Dr. Everitt Amber at [phone] After hours and on week-ends call [phone] and ask to speak to the physician on call for Gynecologic Oncology   Tramadol tablets What is this medicine? TRAMADOL (TRA ma dole) is a pain reliever. It is used to treat moderate to severe pain in  adults. This medicine may be used for other purposes; ask your health care provider or pharmacist if you have questions. COMMON BRAND NAME(S): Ultram What should I tell my health care provider before I take this medicine? They need to know if you have any of these conditions: -brain tumor -depression -drug abuse or addiction -head injury -if you frequently drink alcohol containing drinks -kidney disease or trouble passing urine -liver disease -lung disease, asthma, or breathing problems -seizures or epilepsy -suicidal thoughts, plans, or attempt; a previous suicide attempt by you or a family member -an unusual or allergic reaction to tramadol, codeine, other medicines, foods, dyes, or preservatives -pregnant or trying to get pregnant -breast-feeding How should I use this medicine? Take this medicine by mouth with a full glass of water. Follow the directions on the prescription label. You can take it with or without food. If it upsets your stomach, take it with food. Do not take your medicine more often than directed. A special MedGuide will be given to you by the pharmacist with each prescription and refill. Be sure to read this information carefully each time. Talk to your pediatrician regarding the use of this medicine in children. Special care may be needed. Overdosage: If you think you have taken too much of this medicine contact a poison control center or emergency room at once. NOTE: This medicine is only for  you. Do not share this medicine with others. What if I miss a dose? If you miss a dose, take it as soon as you can. If it is almost time for your next dose, take only that dose. Do not take double or extra doses. What may interact with this medicine? Do not take this medication with any of the following medicines: -MAOIs like Carbex, Eldepryl, Marplan, Nardil, and Parnate This medicine may also interact with the following medications: -alcohol -antihistamines for allergy,  cough and cold -certain medicines for anxiety or sleep -certain medicines for depression like amitriptyline, fluoxetine, sertraline -certain medicines for migraine headache like almotriptan, eletriptan, frovatriptan, naratriptan, rizatriptan, sumatriptan, zolmitriptan -certain medicines for seizures like carbamazepine, oxcarbazepine, phenobarbital, primidone -certain medicines that treat or prevent blood clots like warfarin -digoxin -furazolidone -general anesthetics like halothane, isoflurane, methoxyflurane, propofol -linezolid -local anesthetics like lidocaine, pramoxine, tetracaine -medicines that relax muscles for surgery -other narcotic medicines for pain or cough -phenothiazines like chlorpromazine, mesoridazine, prochlorperazine, thioridazine -procarbazine This list may not describe all possible interactions. Give your health care provider a list of all the medicines, herbs, non-prescription drugs, or dietary supplements you use. Also tell them if you smoke, drink alcohol, or use illegal drugs. Some items may interact with your medicine. What should I watch for while using this medicine? Tell your doctor or health care professional if your pain does not go away, if it gets worse, or if you have new or a different type of pain. You may develop tolerance to the medicine. Tolerance means that you will need a higher dose of the medicine for pain relief. Tolerance is normal and is expected if you take this medicine for a long time. Do not suddenly stop taking your medicine because you may develop a severe reaction. Your body becomes used to the medicine. This does NOT mean you are addicted. Addiction is a behavior related to getting and using a drug for a non-medical reason. If you have pain, you have a medical reason to take pain medicine. Your doctor will tell you how much medicine to take. If your doctor wants you to stop the medicine, the dose will be slowly lowered over time to avoid any  side effects. There are different types of narcotic medicines (opiates). If you take more than one type at the same time or if you are taking another medicine that also causes drowsiness, you may have more side effects. Give your health care provider a list of all medicines you use. Your doctor will tell you how much medicine to take. Do not take more medicine than directed. Call emergency for help if you have problems breathing or unusual sleepiness. You may get drowsy or dizzy. Do not drive, use machinery, or do anything that needs mental alertness until you know how this medicine affects you. Do not stand or sit up quickly, especially if you are an older patient. This reduces the risk of dizzy or fainting spells. Alcohol can increase or decrease the effects of this medicine. Avoid alcoholic drinks. You may have constipation. Try to have a bowel movement at least every 2 to 3 days. If you do not have a bowel movement for 3 days, call your doctor or health care professional. Your mouth may get dry. Chewing sugarless gum or sucking hard candy, and drinking plenty of water may help. Contact your doctor if the problem does not go away or is severe. What side effects may I notice from receiving this medicine? Side effects that you  should report to your doctor or health care professional as soon as possible: -allergic reactions like skin rash, itching or hives, swelling of the face, lips, or tongue -breathing problems -confusion -seizures -signs and symptoms of low blood pressure like dizziness; feeling faint or lightheaded, falls; unusually weak or tired -trouble passing urine or change in the amount of urine Side effects that usually do not require medical attention (report to your doctor or health care professional if they continue or are bothersome): -constipation -dry mouth -nausea, vomiting -tiredness This list may not describe all possible side effects. Call your doctor for medical advice about  side effects. You may report side effects to FDA at 1-800-FDA-1088. Where should I keep my medicine? Keep out of the reach of children. This medicine may cause accidental overdose and death if it taken by other adults, children, or pets. Mix any unused medicine with a substance like cat litter or coffee grounds. Then throw the medicine away in a sealed container like a sealed bag or a coffee can with a lid. Do not use the medicine after the expiration date. Store at room temperature between 15 and 30 degrees C (59 and 86 degrees F). NOTE: This sheet is a summary. It may not cover all possible information. If you have questions about this medicine, talk to your doctor, pharmacist, or health care provider.  2018 Elsevier/Gold Standard (2015-05-04 09:00:04)

## 2017-02-16 NOTE — Progress Notes (Signed)
BP med was held today due to low bp and pulse, PA was notified.  Pt tolerating diet with no complaints of nausea and only mild pain.  D/C instructions were given, as well as prescription. Understanding was verbalized.

## 2017-02-17 ENCOUNTER — Telehealth: Payer: Self-pay | Admitting: Gynecologic Oncology

## 2017-02-17 NOTE — Telephone Encounter (Signed)
Post op telephone call to check patient status.  Patient describes expected post operative status.  Adequate PO intake reported.  Bowels and bladder functioning without difficulty.  Pain minimal.  Reportable signs and symptoms reviewed.  Follow up appt arranged.  Final path discussed.  Advised to call for any questions or concerns.

## 2017-02-21 ENCOUNTER — Telehealth: Payer: Self-pay

## 2017-02-21 ENCOUNTER — Ambulatory Visit: Payer: BC Managed Care – PPO | Attending: Gynecologic Oncology | Admitting: Gynecologic Oncology

## 2017-02-21 VITALS — BP 143/78 | HR 72 | Temp 98.3°F | Resp 20

## 2017-02-21 DIAGNOSIS — Z7982 Long term (current) use of aspirin: Secondary | ICD-10-CM | POA: Insufficient documentation

## 2017-02-21 DIAGNOSIS — E119 Type 2 diabetes mellitus without complications: Secondary | ICD-10-CM | POA: Insufficient documentation

## 2017-02-21 DIAGNOSIS — B372 Candidiasis of skin and nail: Secondary | ICD-10-CM

## 2017-02-21 DIAGNOSIS — Z7984 Long term (current) use of oral hypoglycemic drugs: Secondary | ICD-10-CM | POA: Diagnosis not present

## 2017-02-21 DIAGNOSIS — Z880 Allergy status to penicillin: Secondary | ICD-10-CM | POA: Diagnosis not present

## 2017-02-21 DIAGNOSIS — F419 Anxiety disorder, unspecified: Secondary | ICD-10-CM | POA: Diagnosis not present

## 2017-02-21 DIAGNOSIS — N840 Polyp of corpus uteri: Secondary | ICD-10-CM | POA: Diagnosis not present

## 2017-02-21 DIAGNOSIS — I10 Essential (primary) hypertension: Secondary | ICD-10-CM | POA: Insufficient documentation

## 2017-02-21 DIAGNOSIS — R21 Rash and other nonspecific skin eruption: Secondary | ICD-10-CM | POA: Diagnosis present

## 2017-02-21 DIAGNOSIS — Z79899 Other long term (current) drug therapy: Secondary | ICD-10-CM | POA: Diagnosis not present

## 2017-02-21 MED ORDER — FLUCONAZOLE 150 MG PO TABS: 150.0000 mg | ORAL_TABLET | Freq: Two times a day (BID) | ORAL | 0 refills | Status: DC

## 2017-02-21 NOTE — Patient Instructions (Addendum)
Plan to begin taking diflucan for one week.  If your symptoms are not improved in three days or if they worsen, call the office and we will switch you to an antibiotic (clindamycin).  Fluconazole tablets What is this medicine? FLUCONAZOLE (floo KON na zole) is an antifungal medicine. It is used to treat certain kinds of fungal or yeast infections. This medicine may be used for other purposes; ask your health care provider or pharmacist if you have questions. COMMON BRAND NAME(S): Diflucan What should I tell my health care provider before I take this medicine? They need to know if you have any of these conditions: -history of irregular heart beat -kidney disease -an unusual or allergic reaction to fluconazole, other azole antifungals, medicines, foods, dyes, or preservatives -pregnant or trying to get pregnant -breast-feeding How should I use this medicine? Take this medicine by mouth. Follow the directions on the prescription label. Do not take your medicine more often than directed. Talk to your pediatrician regarding the use of this medicine in children. Special care may be needed. This medicine has been used in children as young as 46 months of age. Overdosage: If you think you have taken too much of this medicine contact a poison control center or emergency room at once. NOTE: This medicine is only for you. Do not share this medicine with others. What if I miss a dose? If you miss a dose, take it as soon as you can. If it is almost time for your next dose, take only that dose. Do not take double or extra doses. What may interact with this medicine? Do not take this medicine with any of the following medications: -astemizole -certain medicines for irregular heart beat like dofetilide, dronedarone, quinidine -cisapride -erythromycin -lomitapide -other medicines that prolong the QT interval (cause an abnormal heart  rhythm) -pimozide -terfenadine -thioridazine -tolvaptan -ziprasidone This medicine may also interact with the following medications: -antiviral medicines for HIV or AIDS -birth control pills -certain antibiotics like rifabutin, rifampin -certain medicines for blood pressure like amlodipine, isradipine, felodipine, hydrochlorothiazide, losartan, nifedipine -certain medicines for cancer like cyclophosphamide, vinblastine, vincristine -certain medicines for cholesterol like atorvastatin, lovastatin, fluvastatin, simvastatin -certain medicines for depression, anxiety, or psychotic disturbances like amitriptyline, midazolam, nortriptyline, triazolam -certain medicines for diabetes like glipizide, glyburide, tolbutamide -certain medicines for pain like alfentanil, fentanyl, methadone -certain medicines for seizures like carbamazepine, phenytoin -certain medicines that treat or prevent blood clots like warfarin -halofantrine -medicines that lower your chance of fighting infection like cyclosporine, prednisone, tacrolimus -NSAIDS, medicines for pain and inflammation, like celecoxib, diclofenac, flurbiprofen, ibuprofen, meloxicam, naproxen -other medicines for fungal infections -sirolimus -theophylline -tofacitinib This list may not describe all possible interactions. Give your health care provider a list of all the medicines, herbs, non-prescription drugs, or dietary supplements you use. Also tell them if you smoke, drink alcohol, or use illegal drugs. Some items may interact with your medicine. What should I watch for while using this medicine? Visit your doctor or health care professional for regular checkups. If you are taking this medicine for a long time you may need blood work. Tell your doctor if your symptoms do not improve. Some fungal infections need many weeks or months of treatment to cure. Alcohol can increase possible damage to your liver. Avoid alcoholic drinks. If you have a  vaginal infection, do not have sex until you have finished your treatment. You can wear a sanitary napkin. Do not use tampons. Wear freshly washed cotton, not synthetic, panties. What side effects  may I notice from receiving this medicine? Side effects that you should report to your doctor or health care professional as soon as possible: -allergic reactions like skin rash or itching, hives, swelling of the lips, mouth, tongue, or throat -dark urine -feeling dizzy or faint -irregular heartbeat or chest pain -redness, blistering, peeling or loosening of the skin, including inside the mouth -trouble breathing -unusual bruising or bleeding -vomiting -yellowing of the eyes or skin Side effects that usually do not require medical attention (report to your doctor or health care professional if they continue or are bothersome): -changes in how food tastes -diarrhea -headache -stomach upset or nausea This list may not describe all possible side effects. Call your doctor for medical advice about side effects. You may report side effects to FDA at 1-800-FDA-1088. Where should I keep my medicine? Keep out of the reach of children. Store at room temperature below 30 degrees C (86 degrees F). Throw away any medicine after the expiration date. NOTE: This sheet is a summary. It may not cover all possible information. If you have questions about this medicine, talk to your doctor, pharmacist, or health care provider.  2018 Elsevier/Gold Standard (2013-03-17 19:37:38)

## 2017-02-21 NOTE — Telephone Encounter (Signed)
[name] called stating that she had surgery on Thursday 02-17-17. That evening she noticed a red raised spot on her lower abdomen.  Since then a raised, red, and itchy rash has developed and is spreading down to her thigh.   She has applied nystatin and clotrimazole to affected areas with out improvement. Afebrile. Pt not using any new products or medications as she has sensitive skin. Pt did have a lovenox injection pre op. Pt able to come in today at 2:30 pm to be evaluated by Joylene John, NP.

## 2017-02-22 ENCOUNTER — Telehealth: Payer: Self-pay | Admitting: Gynecologic Oncology

## 2017-02-22 NOTE — Progress Notes (Signed)
Follow Up Note: Gyn-Onc  Stacie Marquez 66 y.o. female  CC:  Chief Complaint  Patient presents with   Abdominal Rash    post-op    HPI: Stacie Marquez is a 66 year old female initially referred by Dr Ouida Sills for complex atypical hyperplasia of the endometrium.  She reports being started on aspirin in February 2018 and began experiencing postmenopausal bleeding. She was seen by Dr. Ouida Sills and an [place] was performed on 12/29/16 which showed a 6.2x4.6x5.2cm uterus with a thickened endometrium.  A hysteroscopy with D&C was performed on 01/25/17 which showed an endometrial polyp with CAH and the curettage showed CAH.  She has a past history of 1 SVD and 1 cesarean. She has had a laparoscopic cholecystectomy in the past.  She had a cardiac stress test in 2017 which was unremarkable for blockage.  On 02/15/17, she underwent a robotic-assisted laparoscopic total hysterectomy with bilateral salpingoophorectomy, SLN mapping, removal of left thigh skin tag with Dr. Everitt Amber.  Her post-operative course was uneventful.  Final pathology revealed: Diagnosis 1. Skin, tag, left - BENIGN FIBROEPITHELIAL POLYP. - NO DYSPLASIA OR MALIGNANCY. 2. Uterus, ovaries and fallopian tubes, cervix - ENDOMETRIUM: - COMPLEX ATYPICAL HYPERPLASIA INVOLVING ENDOMETRIUM AND MULTIPLE ENDOMETRIAL POLYPS. - NO EVIDENCE OF MYOMETRIAL INVASION. - CERVIX: - NABOTHIAN CYST. - NO EVIDENCE OF DYSPLASIA. - MYOMETRIUM: - LEIOMYOMATA. - NO EVIDENCE OF MALIGNANCY. - RIGHT OVARY: - FOCAL ENDOSALPINGIOSIS. - NO ENDOMETRIOSIS OR MALIGNANCY. - LEFT OVARY: - UNREMARKABLE. - NO ENDOMETRIOSIS OR MALIGNANCY. - RIGHT AND LEFT FALLOPIAN TUBES: - UNREMARKABLE. - NO ENDOMETRIOSIS OR MALIGNANCY.  Interval History:  She presents today with her grand-daughter for evaluation of a rash on her abdomen noted two days post-operatively.  She stated she first noticed a quarter size red area her right abdomen starting Thursday evening.  The area was  itchy and eventually spread down to her upper right thigh.  She has been using nystatin powder and antifungal/steroid cream with no improvement.  Her granddaughter feels that the rash has worsened since yesterday and states it appears to be moving to the left inner thigh which comes in contact with the right.  She denies any changes in routine etc and does reports that she received her lovenox injection on that side pre-operatively.  She does reports having mild yeast under her breasts in the past with use of nystatin powder to relieve symptoms.  Doing well post-op except for rash.  Bowels and bladder functioning without difficulty.  Incisions healing.  No vaginal bleeding reported.  No other concerns voiced.       Review of Systems  Constitutional: Feels well. No fever, chills, change in appetite. Cardiovascular: No chest pain, shortness of breath, or edema.  Pulmonary: No cough or wheeze.  Gastrointestinal: No nausea, vomiting, or diarrhea. No bright red blood per rectum or change in bowel movement.  Genitourinary: No frequency, urgency, or dysuria. No vaginal bleeding or discharge.  Musculoskeletal: No myalgia or joint pain. Neurologic: No weakness, numbness, or change in gait.  Psychology: No depression, anxiety, or insomnia.  Current Meds:  Outpatient Encounter Prescriptions as of 02/21/2017  Medication Sig   acetaminophen (TYLENOL) 500 MG tablet Take 500 mg by mouth every 6 (six) hours as needed for mild pain or moderate pain.   aspirin EC 81 MG tablet Take 81 mg by mouth daily.   busPIRone (BUSPAR) 15 MG tablet Take 7.5 mg by mouth 2 (two) times daily.   fluconazole (DIFLUCAN) 150 MG tablet Take 1 tablet (  150 mg total) by mouth 2 (two) times daily.   furosemide (LASIX) 40 MG tablet Take 40 mg by mouth as needed for edema.   losartan (COZAAR) 50 MG tablet Take 50 mg by mouth daily.   metFORMIN (GLUCOPHAGE-XR) 500 MG 24 hr tablet Take 500 mg by mouth at bedtime.   traMADol (ULTRAM)  50 MG tablet Take 1 tablet (50 mg total) by mouth every 6 (six) hours as needed.   No facility-administered encounter medications on file as of 02/21/2017.     Allergy:  Allergies  Allergen Reactions   Penicillins Rash    Social Hx:   Social History   Social History   Marital status: Single    Spouse name: N/A   Number of children: N/A   Years of education: N/A   Occupational History   Not on file.   Social History Main Topics   Smoking status: Never Smoker   Smokeless tobacco: Never Used   Alcohol use No   Drug use: No   Sexual activity: Not on file   Other Topics Concern   Not on file   Social History Narrative   No narrative on file    Past Surgical Hx:  Past Surgical History:  Procedure Laterality Date   ACHILLES TENDON SURGERY     CESAREAN SECTION     CHOLECYSTECTOMY  07/27/2012   Procedure: LAPAROSCOPIC CHOLECYSTECTOMY WITH INTRAOPERATIVE CHOLANGIOGRAM;  Surgeon: Leighton Ruff, MD;  Location: [facility];  Service: General;  Laterality: N/A;   COLONOSCOPY     [place] N/A 01/25/2017   Procedure: [place];  Surgeon: Olga Millers, MD;  Location: [facility];  Service: Gynecology;  Laterality: N/A;   FRACTURE SURGERY     right arm fracture and shoulder disclocation   ROBOTIC ASSISTED TOTAL HYSTERECTOMY WITH BILATERAL SALPINGO OOPHERECTOMY N/A 02/15/2017   Procedure: XI ROBOTIC ASSISTED TOTAL HYSTERECTOMY WITH BILATERAL SALPINGO OOPHORECTOMY;  Surgeon: Everitt Amber, MD;  Location: [facility];  Service: Gynecology;  Laterality: N/A;   SENTINEL NODE BIOPSY N/A 02/15/2017   Procedure: SENTINEL NODE BIOPSY AND LEFT THIGH BIOPSY;  Surgeon: Everitt Amber, MD;  Location: [facility];  Service: Gynecology;  Laterality: N/A;    Past Medical Hx:  Past Medical History:  Diagnosis Date   Ankle swelling    bilateral   Anxiety    Diabetes mellitus without complication (HCC)    Hypertension     Leg swelling    bilateral   Plantar fasciitis    left foot   Pre-diabetes    Rosacea     Family Hx: No family history on file.  Vitals:  Blood pressure (!) 143/78, pulse 72, temperature 98.3 F (36.8 C), temperature source Oral, resp. rate 20, SpO2 95 %.  Physical Exam:  General: Well developed, well nourished female in no acute distress. Alert and oriented x 3.  Abdomen: Abdomen soft, non-tender and morbidly obese. Lap sites healing without erythema or drainage.  Beefy red rash noted on the right abdomen moving underneath the pannus on the right and to the thigh.  Mild redness also noted on the inner aspect of the left thigh.  Rash with satellite lesions, sharply demarcated, blanching, increased warmth.  Rash also assessed by Dr. Denman George Extremities: No bilateral cyanosis, edema, or clubbing. Compression hose bilaterally.  Assessment/Plan: 66 year old s/p robotic hysterectomy, BSO, SLN bx for complex atypical hyperplasia.  Probable candidiasis infection.  Per Dr. Denman George, plan to begin diflucan 150 mg twice  daily for seven days.  If no improvement in three days, plan to switch patient to clindamycin and stop diflucan.  Reportable signs and symptoms reviewed.  She is to call with an update.  Information given on diflucan.  Advised to call for any needs or concerns.          Swathi Dauphin DEAL, NP 02/22/2017, 10:23 AM

## 2017-02-22 NOTE — Telephone Encounter (Signed)
Called to check on patient after yesterday's visit.  She states she is doing well and it appears the redness/rash has not spread.  She is advised to continue to monitor and all for any changes or concerns.  Reportable signs and symptoms reviewed.

## 2017-02-24 ENCOUNTER — Telehealth: Payer: Self-pay | Admitting: Gynecologic Oncology

## 2017-02-24 DIAGNOSIS — T8140XD Infection following a procedure, unspecified, subsequent encounter: Secondary | ICD-10-CM

## 2017-02-24 DIAGNOSIS — Y92242 Post office as the place of occurrence of the external cause: Secondary | ICD-10-CM

## 2017-02-24 MED ORDER — CLINDAMYCIN HCL 300 MG PO CAPS: 300.0000 mg | ORAL_CAPSULE | Freq: Three times a day (TID) | ORAL | 0 refills | Status: DC

## 2017-02-24 NOTE — Telephone Encounter (Signed)
Returned call to patient.  She called and left a message stating her incision on either side of the umbilicus are now red and itchy.  She states the previous rash area on her right abdomen has turned darker and has not spread.  She feels it is getting better.  No drainage reported from the incision.  Dermabond still in place.  She reports light spotting since surgery.  Advised that we would reach out to Dr. Denman George and call her back with her recommendations.  Update: 10:48 am: Called patient to inform her that Dr. Denman George would like for her to begin clindamycin three times daily.  Advised to stop taking diflucan and begin abx.  Reportable signs and symptoms reviewed.  She is advised to call tomorrow with an update.

## 2017-03-01 ENCOUNTER — Telehealth: Payer: Self-pay | Admitting: Gynecologic Oncology

## 2017-03-02 NOTE — Telephone Encounter (Signed)
Called to check on patient's current status.  She states she feels like a different person.  States the redness has faded and now the area on the right abd is peeling.  No drainage or fever reported.  The redness that was reported around her incisions at the umbilicus is also fading.  No concerns voiced.  She states she is doing much better.  Advised to call for any needs or concerns.

## 2017-03-09 ENCOUNTER — Ambulatory Visit: Payer: BC Managed Care – PPO | Attending: Gynecologic Oncology | Admitting: Gynecologic Oncology

## 2017-03-09 ENCOUNTER — Encounter: Payer: Self-pay | Admitting: Gynecologic Oncology

## 2017-03-09 VITALS — BP 125/65 | HR 68 | Temp 98.3°F | Resp 20 | Wt 264.0 lb

## 2017-03-09 DIAGNOSIS — I10 Essential (primary) hypertension: Secondary | ICD-10-CM | POA: Diagnosis not present

## 2017-03-09 DIAGNOSIS — Z9889 Other specified postprocedural states: Secondary | ICD-10-CM | POA: Diagnosis not present

## 2017-03-09 DIAGNOSIS — Z7984 Long term (current) use of oral hypoglycemic drugs: Secondary | ICD-10-CM | POA: Diagnosis not present

## 2017-03-09 DIAGNOSIS — N8502 Endometrial intraepithelial neoplasia [EIN]: Secondary | ICD-10-CM | POA: Diagnosis not present

## 2017-03-09 DIAGNOSIS — R739 Hyperglycemia, unspecified: Secondary | ICD-10-CM | POA: Diagnosis not present

## 2017-03-09 DIAGNOSIS — Z7982 Long term (current) use of aspirin: Secondary | ICD-10-CM | POA: Diagnosis not present

## 2017-03-09 DIAGNOSIS — L03314 Cellulitis of groin: Secondary | ICD-10-CM | POA: Diagnosis not present

## 2017-03-09 DIAGNOSIS — Z90722 Acquired absence of ovaries, bilateral: Secondary | ICD-10-CM | POA: Insufficient documentation

## 2017-03-09 DIAGNOSIS — L03311 Cellulitis of abdominal wall: Secondary | ICD-10-CM | POA: Insufficient documentation

## 2017-03-09 DIAGNOSIS — R21 Rash and other nonspecific skin eruption: Secondary | ICD-10-CM | POA: Diagnosis not present

## 2017-03-09 DIAGNOSIS — E119 Type 2 diabetes mellitus without complications: Secondary | ICD-10-CM | POA: Diagnosis not present

## 2017-03-09 DIAGNOSIS — Z880 Allergy status to penicillin: Secondary | ICD-10-CM | POA: Insufficient documentation

## 2017-03-09 DIAGNOSIS — Z9049 Acquired absence of other specified parts of digestive tract: Secondary | ICD-10-CM | POA: Diagnosis not present

## 2017-03-09 DIAGNOSIS — M7989 Other specified soft tissue disorders: Secondary | ICD-10-CM | POA: Diagnosis not present

## 2017-03-09 DIAGNOSIS — L719 Rosacea, unspecified: Secondary | ICD-10-CM | POA: Diagnosis not present

## 2017-03-09 DIAGNOSIS — Z90710 Acquired absence of both cervix and uterus: Secondary | ICD-10-CM

## 2017-03-09 NOTE — Patient Instructions (Signed)
Follow up with your other physicians.  No follow up needed with Dr. Denman George at this time unless issues or concerns arise.  Plan to return to work on July 30.  Please call for any questions or concerns.

## 2017-03-09 NOTE — Progress Notes (Signed)
Follow Up Note: Gyn-Onc  Stacie Marquez 66 y.o. female  CC:  Chief Complaint  Patient presents with   Complex atypical endometrial hyperplasia    HPI: Stacie Marquez is a 66 year old female initially referred by Dr Ouida Sills for complex atypical hyperplasia of the endometrium.  She reports being started on aspirin in February 2018 and began experiencing postmenopausal bleeding. She was seen by Dr. Ouida Sills and an [place] was performed on 12/29/16 which showed a 6.2x4.6x5.2cm uterus with a thickened endometrium.  A hysteroscopy with D&C was performed on 01/25/17 which showed an endometrial polyp with CAH and the curettage showed CAH.  She has a past history of 1 SVD and 1 cesarean. She has had a laparoscopic cholecystectomy in the past.  She had a cardiac stress test in 2017 which was unremarkable for blockage.  On 02/15/17, she underwent a robotic-assisted laparoscopic total hysterectomy with bilateral salpingoophorectomy, SLN mapping, removal of left thigh skin tag with Dr. Everitt Amber.  Her post-operative course was uneventful.  Final pathology revealed: Diagnosis 1. Skin, tag, left - BENIGN FIBROEPITHELIAL POLYP. - NO DYSPLASIA OR MALIGNANCY. 2. Uterus, ovaries and fallopian tubes, cervix - ENDOMETRIUM: - COMPLEX ATYPICAL HYPERPLASIA INVOLVING ENDOMETRIUM AND MULTIPLE ENDOMETRIAL POLYPS. - NO EVIDENCE OF MYOMETRIAL INVASION. - CERVIX: - NABOTHIAN CYST. - NO EVIDENCE OF DYSPLASIA. - MYOMETRIUM: - LEIOMYOMATA. - NO EVIDENCE OF MALIGNANCY. - RIGHT OVARY: - FOCAL ENDOSALPINGIOSIS. - NO ENDOMETRIOSIS OR MALIGNANCY. - LEFT OVARY: - UNREMARKABLE. - NO ENDOMETRIOSIS OR MALIGNANCY. - RIGHT AND LEFT FALLOPIAN TUBES: - UNREMARKABLE. - NO ENDOMETRIOSIS OR MALIGNANCY.  Interval History:  She developed post op abdominal/right groin cellulitis which was remote from any robotic incision, vagina or pelvic site of surgery (not a surgical site infection). It was initially treated with fluconazole in  expectation that it was yeast (given its location in her pannus), however, when this did not resolve symptoms she was treated with clindamycin with relief of symptosm within 2 doses.  In the past week she develoepd edema and a rash (itchy) over her left ear. It has remained stable (not progressive), though the preauricular and left jaw edema has become progressive.  She has light vaginal spotting. No abdominal pain.       Review of Systems  Constitutional: Feels well. No fever, chills, change in appetite. Cardiovascular: No chest pain, shortness of breath, or edema.  Pulmonary: No cough or wheeze.  Gastrointestinal: No nausea, vomiting, or diarrhea. No bright red blood per rectum or change in bowel movement.  Genitourinary: No frequency, urgency, or dysuria. No vaginal bleeding or discharge.  Musculoskeletal: No myalgia or joint pain. Neurologic: No weakness, numbness, or change in gait.  Psychology: No depression, anxiety, or insomnia. EENT:  Edema and rash on left face/ear.  Current Meds:  Outpatient Encounter Prescriptions as of 03/09/2017  Medication Sig   acetaminophen (TYLENOL) 500 MG tablet Take 500 mg by mouth every 6 (six) hours as needed for mild pain or moderate pain.   aspirin EC 81 MG tablet Take 81 mg by mouth daily.   busPIRone (BUSPAR) 15 MG tablet Take 7.5 mg by mouth 2 (two) times daily.   clindamycin (CLEOCIN) 300 MG capsule Take 1 capsule (300 mg total) by mouth 3 (three) times daily.   fluconazole (DIFLUCAN) 150 MG tablet Take 1 tablet (150 mg total) by mouth 2 (two) times daily.   furosemide (LASIX) 40 MG tablet Take 40 mg by mouth as needed for edema.   losartan (COZAAR) 50 MG tablet Take  50 mg by mouth daily.   metFORMIN (GLUCOPHAGE-XR) 500 MG 24 hr tablet Take 500 mg by mouth at bedtime.   traMADol (ULTRAM) 50 MG tablet Take 1 tablet (50 mg total) by mouth every 6 (six) hours as needed.   No facility-administered encounter medications on file as of  03/09/2017.     Allergy:  Allergies  Allergen Reactions   Penicillins Rash    Social Hx:   Social History   Social History   Marital status: Single    Spouse name: N/A   Number of children: N/A   Years of education: N/A   Occupational History   Not on file.   Social History Main Topics   Smoking status: Never Smoker   Smokeless tobacco: Never Used   Alcohol use No   Drug use: No   Sexual activity: Not on file   Other Topics Concern   Not on file   Social History Narrative   No narrative on file    Past Surgical Hx:  Past Surgical History:  Procedure Laterality Date   ACHILLES TENDON SURGERY     CESAREAN SECTION     CHOLECYSTECTOMY  07/27/2012   Procedure: LAPAROSCOPIC CHOLECYSTECTOMY WITH INTRAOPERATIVE CHOLANGIOGRAM;  Surgeon: Leighton Ruff, MD;  Location: [facility];  Service: General;  Laterality: N/A;   COLONOSCOPY     [place] N/A 01/25/2017   Procedure: [place];  Surgeon: Olga Millers, MD;  Location: [facility];  Service: Gynecology;  Laterality: N/A;   FRACTURE SURGERY     right arm fracture and shoulder disclocation   ROBOTIC ASSISTED TOTAL HYSTERECTOMY WITH BILATERAL SALPINGO OOPHERECTOMY N/A 02/15/2017   Procedure: XI ROBOTIC ASSISTED TOTAL HYSTERECTOMY WITH BILATERAL SALPINGO OOPHORECTOMY;  Surgeon: Everitt Amber, MD;  Location: [facility];  Service: Gynecology;  Laterality: N/A;   SENTINEL NODE BIOPSY N/A 02/15/2017   Procedure: SENTINEL NODE BIOPSY AND LEFT THIGH BIOPSY;  Surgeon: Everitt Amber, MD;  Location: [facility];  Service: Gynecology;  Laterality: N/A;    Past Medical Hx:  Past Medical History:  Diagnosis Date   Ankle swelling    bilateral   Anxiety    Diabetes mellitus without complication (HCC)    Hypertension    Leg swelling    bilateral   Plantar fasciitis    left foot   Pre-diabetes    Rosacea     Family Hx: History reviewed. No  pertinent family history.  Vitals:  Blood pressure 125/65, pulse 68, temperature 98.3 F (36.8 C), temperature source Oral, resp. rate 20, weight 264 lb (119.7 kg), SpO2 97 %.  Physical Exam:  General: Well developed, well nourished female in no acute distress. Alert and oriented x 3.  HEENT: firm edema in left preauricular area. Rash/escoriation at opening to outer ear/pinna. No erythema. Nontender to touch. No crepitus. Abdomen: Abdomen soft, non-tender and morbidly obese. Lap sites healed without erythema or drainage.  No longer has any evidence of cellulitis on abdomen or groin. Pelvic: well healed skin tag site. Vaginal cuff in tact. Scant old blood in vagina, no active bleeding. Extremities: No bilateral cyanosis, edema, or clubbing. Compression hose bilaterally.  Assessment/Plan: 66 year old s/p robotic hysterectomy, BSO, SLN bx for complex atypical hyperplasia. Complicated post op by a groin cellulitis (not a surgical site infection as this was remote to incisions and remote to the pelvic anatomy). It has completely resolved with oral antibiotics. I discussed with the patient that the development of this cellulitis raises  the suspicion that her hyperglycemia may be true diabetes rather than pre diabetes.  Left preauricular edema and periauricular rash. No completely consistent with zosta/shingles. Discussed with patient that if the edema or discomfort becomes worse rather than better she should see her PCP immediately to have this evaluated. Given her recent cellulitis I worry that she is prone to skin infections.  With respect to her complex atypical hyperplasia, she requires no additional follow-up specific to this. She does not require pap smear surveillance. She should follow-up with Dr Ouida Sills annually for a well woman check.  [name], MD 03/09/2017, 5:24 PM

## 2017-03-14 ENCOUNTER — Ambulatory Visit: Payer: BC Managed Care – PPO | Admitting: Gynecologic Oncology

## 2017-08-21 ENCOUNTER — Emergency Department (HOSPITAL_COMMUNITY): Payer: BC Managed Care – PPO

## 2017-08-21 ENCOUNTER — Inpatient Hospital Stay (HOSPITAL_COMMUNITY): Payer: BC Managed Care – PPO

## 2017-08-21 ENCOUNTER — Encounter (HOSPITAL_COMMUNITY): Payer: Self-pay

## 2017-08-21 ENCOUNTER — Inpatient Hospital Stay (HOSPITAL_COMMUNITY)
Admission: EM | Admit: 2017-08-21 | Discharge: 2017-08-29 | DRG: 176 | Disposition: A | Discharge status: Home / Self Care | Payer: BC Managed Care – PPO | Attending: Cardiovascular Disease | Admitting: Cardiovascular Disease

## 2017-08-21 ENCOUNTER — Other Ambulatory Visit: Payer: Self-pay

## 2017-08-21 ENCOUNTER — Emergency Department (HOSPITAL_COMMUNITY)
Admit: 2017-08-21 | Discharge: 2017-08-21 | Disposition: A | Discharge status: Home / Self Care | Payer: BC Managed Care – PPO | Attending: Emergency Medicine | Admitting: Emergency Medicine

## 2017-08-21 DIAGNOSIS — I2609 Other pulmonary embolism with acute cor pulmonale: Secondary | ICD-10-CM | POA: Diagnosis not present

## 2017-08-21 DIAGNOSIS — J209 Acute bronchitis, unspecified: Secondary | ICD-10-CM | POA: Diagnosis present

## 2017-08-21 DIAGNOSIS — R531 Weakness: Secondary | ICD-10-CM

## 2017-08-21 DIAGNOSIS — M1990 Unspecified osteoarthritis, unspecified site: Secondary | ICD-10-CM | POA: Diagnosis present

## 2017-08-21 DIAGNOSIS — Z880 Allergy status to penicillin: Secondary | ICD-10-CM

## 2017-08-21 DIAGNOSIS — Z6841 Body Mass Index (BMI) 40.0 and over, adult: Secondary | ICD-10-CM

## 2017-08-21 DIAGNOSIS — R0609 Other forms of dyspnea: Secondary | ICD-10-CM

## 2017-08-21 DIAGNOSIS — I5189 Other ill-defined heart diseases: Secondary | ICD-10-CM | POA: Diagnosis present

## 2017-08-21 DIAGNOSIS — Z7984 Long term (current) use of oral hypoglycemic drugs: Secondary | ICD-10-CM

## 2017-08-21 DIAGNOSIS — I82412 Acute embolism and thrombosis of left femoral vein: Secondary | ICD-10-CM | POA: Diagnosis present

## 2017-08-21 DIAGNOSIS — M7989 Other specified soft tissue disorders: Secondary | ICD-10-CM

## 2017-08-21 DIAGNOSIS — I10 Essential (primary) hypertension: Secondary | ICD-10-CM | POA: Diagnosis present

## 2017-08-21 DIAGNOSIS — I2699 Other pulmonary embolism without acute cor pulmonale: Secondary | ICD-10-CM | POA: Diagnosis present

## 2017-08-21 DIAGNOSIS — E119 Type 2 diabetes mellitus without complications: Secondary | ICD-10-CM | POA: Diagnosis present

## 2017-08-21 DIAGNOSIS — Z7982 Long term (current) use of aspirin: Secondary | ICD-10-CM | POA: Diagnosis not present

## 2017-08-21 LAB — BASIC METABOLIC PANEL
ANION GAP: 11 (ref 5–15)
BUN: 12 mg/dL (ref 6–20)
CHLORIDE: 105 mmol/L (ref 101–111)
CO2: 25 mmol/L (ref 22–32)
Calcium: 8.9 mg/dL (ref 8.9–10.3)
Creatinine, Ser: 0.94 mg/dL (ref 0.44–1.00)
GFR calc non Af Amer: >60 mL/min (ref >60)
Glucose, Bld: 120 mg/dL — ABNORMAL HIGH (ref 65–99)
POTASSIUM: 3.7 mmol/L (ref 3.5–5.1)
SODIUM: 141 mmol/L (ref 135–145)

## 2017-08-21 LAB — URINALYSIS, ROUTINE W REFLEX MICROSCOPIC
BACTERIA UA: NONE SEEN
BILIRUBIN URINE: NEGATIVE
Glucose, UA: NEGATIVE mg/dL
Hgb urine dipstick: NEGATIVE
KETONES UR: NEGATIVE mg/dL
LEUKOCYTES UA: NEGATIVE
Nitrite: NEGATIVE
PH: 7 (ref 5.0–8.0)
PROTEIN: NEGATIVE mg/dL
RBC / HPF: NONE SEEN RBC/hpf (ref 0–5)
Specific Gravity, Urine: 1.014 (ref 1.005–1.030)
WBC UA: NONE SEEN WBC/hpf (ref 0–5)

## 2017-08-21 LAB — BRAIN NATRIURETIC PEPTIDE: B NATRIURETIC PEPTIDE 5: 40.8 pg/mL (ref 0.0–100.0)

## 2017-08-21 LAB — HEPATIC FUNCTION PANEL
ALK PHOS: 58 U/L (ref 38–126)
ALT: 24 U/L (ref 14–54)
AST: 28 U/L (ref 15–41)
Albumin: 3.3 g/dL — ABNORMAL LOW (ref 3.5–5.0)
BILIRUBIN INDIRECT: 0.6 mg/dL (ref 0.3–0.9)
Bilirubin, Direct: 0.1 mg/dL (ref 0.1–0.5)
TOTAL PROTEIN: 6.1 g/dL — AB (ref 6.5–8.1)
Total Bilirubin: 0.7 mg/dL (ref 0.3–1.2)

## 2017-08-21 LAB — CBC
HEMATOCRIT: 42.3 % (ref 36.0–46.0)
HEMOGLOBIN: 14.6 g/dL (ref 12.0–15.0)
MCH: 30 pg (ref 26.0–34.0)
MCHC: 34.5 g/dL (ref 30.0–36.0)
MCV: 86.9 fL (ref 78.0–100.0)
PLATELETS: 162 10*3/uL (ref 150–400)
RBC: 4.87 MIL/uL (ref 3.87–5.11)
RDW: 13.7 % (ref 11.5–15.5)
WBC: 8.2 10*3/uL (ref 4.0–10.5)

## 2017-08-21 LAB — I-STAT TROPONIN, ED: Troponin i, poc: 0.01 ng/mL (ref 0.00–0.08)

## 2017-08-21 LAB — D-DIMER, QUANTITATIVE (NOT AT ARMC): D DIMER QUANT: 9.84 ug{FEU}/mL — AB (ref 0.00–0.50)

## 2017-08-21 LAB — LIPASE, BLOOD: Lipase: 28 U/L (ref 11–51)

## 2017-08-21 LAB — HEPARIN LEVEL (UNFRACTIONATED): Heparin Unfractionated: 0.4 IU/mL (ref 0.30–0.70)

## 2017-08-21 MED ORDER — IOPAMIDOL (ISOVUE-370) INJECTION 76%
INTRAVENOUS | Status: AC
  Administered 2017-08-21: 100 mL
  Filled 2017-08-21: qty 100

## 2017-08-21 MED ORDER — ACETAMINOPHEN 500 MG PO TABS
500.0000 mg | ORAL_TABLET | Freq: Four times a day (QID) | ORAL | Status: DC | PRN
  Administered 2017-08-22 – 2017-08-23 (×3): 500 mg via ORAL
  Filled 2017-08-21 (×3): qty 1

## 2017-08-21 MED ORDER — BUSPIRONE HCL 10 MG PO TABS
10.0000 mg | ORAL_TABLET | Freq: Two times a day (BID) | ORAL | Status: DC
  Administered 2017-08-21 – 2017-08-29 (×16): 10 mg via ORAL
  Filled 2017-08-21 (×16): qty 1

## 2017-08-21 MED ORDER — HEPARIN BOLUS VIA INFUSION
4000.0000 [IU] | Freq: Once | INTRAVENOUS | Status: AC
  Administered 2017-08-21: 4000 [IU] via INTRAVENOUS
  Filled 2017-08-21: qty 4000

## 2017-08-21 MED ORDER — CYCLOBENZAPRINE HCL 10 MG PO TABS: 10.0000 mg | ORAL_TABLET | Freq: Every evening | ORAL | Status: DC | PRN

## 2017-08-21 MED ORDER — WARFARIN - PHARMACIST DOSING INPATIENT: Freq: Every day | Status: DC

## 2017-08-21 MED ORDER — NITROGLYCERIN 0.4 MG SL SUBL: 0.4000 mg | SUBLINGUAL_TABLET | SUBLINGUAL | Status: DC | PRN

## 2017-08-21 MED ORDER — WARFARIN SODIUM 7.5 MG PO TABS
7.5000 mg | ORAL_TABLET | Freq: Every day | ORAL | Status: DC
  Administered 2017-08-21: 7.5 mg via ORAL
  Filled 2017-08-21: qty 1

## 2017-08-21 MED ORDER — HEPARIN (PORCINE) IN NACL 100-0.45 UNIT/ML-% IJ SOLN
1400.0000 [IU]/h | INTRAMUSCULAR | Status: DC
  Administered 2017-08-21 – 2017-08-29 (×10): 1400 [IU]/h via INTRAVENOUS
  Filled 2017-08-21 (×12): qty 250

## 2017-08-21 MED ORDER — LOSARTAN POTASSIUM 50 MG PO TABS
50.0000 mg | ORAL_TABLET | Freq: Every day | ORAL | Status: DC
  Administered 2017-08-22 – 2017-08-29 (×8): 50 mg via ORAL
  Filled 2017-08-21 (×8): qty 1

## 2017-08-21 NOTE — H&P (Signed)
Referring Physician:  [name] is an 66 y.o. female.                       Chief Complaint: Palpitation and shortness of breath  HPI: 66 years old female has 2 week history of fatigue and exertional dyspnea. She denies chest pain, fever or cough. She has noticed left leg swelling and was told it was arthritis as she has left knee pain and swelling also. Her left leg venous doppler was positive for DVT and her CT of chest is positive for pulmonary embolism with right heart strain.   Past Medical History:  Diagnosis Date  . Ankle swelling    bilateral  . Anxiety   . Diabetes mellitus without complication (HCC)   . Hypertension   . Leg swelling    bilateral  . Plantar fasciitis    left foot  . Pre-diabetes   . Rosacea       Past Surgical History:  Procedure Laterality Date  . ABDOMINAL HYSTERECTOMY    . ACHILLES TENDON SURGERY    . CESAREAN SECTION    . CHOLECYSTECTOMY  07/27/2012   Procedure: LAPAROSCOPIC CHOLECYSTECTOMY WITH INTRAOPERATIVE CHOLANGIOGRAM;  Surgeon: Romie Levee, MD;  Location: [facility];  Service: General;  Laterality: N/A;  . COLONOSCOPY    . DILATATION & CURETTAGE/HYSTEROSCOPY WITH MYOSURE N/A 01/25/2017   Procedure: DILATATION & CURETTAGE/HYSTEROSCOPY WITH MYOSURE;  Surgeon: Levi Aland, MD;  Location: [facility];  Service: Gynecology;  Laterality: N/A;  . FRACTURE SURGERY     right arm fracture and shoulder disclocation  . ROBOTIC ASSISTED TOTAL HYSTERECTOMY WITH BILATERAL SALPINGO OOPHERECTOMY N/A 02/15/2017   Procedure: XI ROBOTIC ASSISTED TOTAL HYSTERECTOMY WITH BILATERAL SALPINGO OOPHORECTOMY;  Surgeon: Adolphus Birchwood, MD;  Location: [facility];  Service: Gynecology;  Laterality: N/A;  . SENTINEL NODE BIOPSY N/A 02/15/2017   Procedure: SENTINEL NODE BIOPSY AND LEFT THIGH BIOPSY;  Surgeon: Adolphus Birchwood, MD;  Location: [facility];  Service: Gynecology;  Laterality: N/A;    No family history on file. Social History:  reports that  has never smoked. she has never used  smokeless tobacco. She reports that she does not drink alcohol or use drugs.  Allergies:  Allergies  Allergen Reactions  . Penicillins Rash     (Not in a hospital admission)  Results for orders placed or performed during the hospital encounter of 08/21/17 (from the past 48 hour(s))  Basic metabolic panel     Status: Abnormal   Collection Time: 08/21/17 11:51 AM  Result Value Ref Range   Sodium 141 135 - 145 mmol/L   Potassium 3.7 3.5 - 5.1 mmol/L   Chloride 105 101 - 111 mmol/L   CO2 25 22 - 32 mmol/L   Glucose, Bld 120 (H) 65 - 99 mg/dL   BUN 12 6 - 20 mg/dL   Creatinine, Ser 9.60 0.44 - 1.00 mg/dL   Calcium 8.9 8.9 - 45.4 mg/dL   GFR calc non Af Amer >60 >60 mL/min   GFR calc Af Amer >60 >60 mL/min    Comment: (NOTE) The eGFR has been calculated using the CKD EPI equation. This calculation has not been validated in all clinical situations. eGFR's persistently <60 mL/min signify possible Chronic Kidney Disease.    Anion gap 11 5 - 15  CBC     Status: None   Collection Time: 08/21/17 11:51 AM  Result Value Ref Range   WBC 8.2 4.0 - 10.5 K/uL   RBC  4.87 3.87 - 5.11 MIL/uL   Hemoglobin 14.6 12.0 - 15.0 g/dL   HCT 01.0 93.2 - 35.5 %   MCV 86.9 78.0 - 100.0 fL   MCH 30.0 26.0 - 34.0 pg   MCHC 34.5 30.0 - 36.0 g/dL   RDW 73.2 20.2 - 54.2 %   Platelets 162 150 - 400 K/uL  Brain natriuretic peptide     Status: None   Collection Time: 08/21/17 12:12 PM  Result Value Ref Range   B Natriuretic Peptide 40.8 0.0 - 100.0 pg/mL  I-stat troponin, ED     Status: None   Collection Time: 08/21/17 12:35 PM  Result Value Ref Range   Troponin i, poc 0.01 0.00 - 0.08 ng/mL   Comment 3            Comment: Due to the release kinetics of cTnI, a negative result within the first hours of the onset of symptoms does not rule out myocardial infarction with certainty. If myocardial infarction is still suspected, repeat the test at appropriate intervals.   Urinalysis, Routine w reflex  microscopic     Status: Abnormal   Collection Time: 08/21/17  3:05 PM  Result Value Ref Range   Color, Urine YELLOW YELLOW   APPearance CLEAR CLEAR   Specific Gravity, Urine 1.014 1.005 - 1.030   pH 7.0 5.0 - 8.0   Glucose, UA NEGATIVE NEGATIVE mg/dL   Hgb urine dipstick NEGATIVE NEGATIVE   Bilirubin Urine NEGATIVE NEGATIVE   Ketones, ur NEGATIVE NEGATIVE mg/dL   Protein, ur NEGATIVE NEGATIVE mg/dL   Nitrite NEGATIVE NEGATIVE   Leukocytes, UA NEGATIVE NEGATIVE   RBC / HPF NONE SEEN 0 - 5 RBC/hpf   WBC, UA NONE SEEN 0 - 5 WBC/hpf   Bacteria, UA NONE SEEN NONE SEEN   Squamous Epithelial / LPF 0-5 (A) NONE SEEN   Mucus PRESENT    Dg Chest 2 View  Result Date: 08/21/2017 CLINICAL DATA:  Dyspnea EXAM: CHEST  2 VIEW COMPARISON:  None. FINDINGS: Mild enlargement of the cardiopericardial silhouette. Otherwise normal mediastinal contour. No pneumothorax. No pleural effusion. Cephalization of the pulmonary vasculature without overt pulmonary edema. No acute consolidative airspace disease. Cholecystectomy clips are seen in the upper abdomen. IMPRESSION: Mild enlargement of the cardiopericardial silhouette. No overt pulmonary edema. No active pulmonary disease. Electronically Signed   By: Delbert Phenix M.D.   On: 08/21/2017 12:35   Ct Angio Chest Pe W/cm &/or Wo Cm  Result Date: 08/21/2017 CLINICAL DATA:  Short of breath and weakness for 1 week. EXAM: CT ANGIOGRAPHY CHEST WITH CONTRAST TECHNIQUE: Multidetector CT imaging of the chest was performed using the standard protocol during bolus administration of intravenous contrast. Multiplanar CT image reconstructions and MIPs were obtained to evaluate the vascular anatomy. CONTRAST:  ISOVUE-370 IOPAMIDOL (ISOVUE-370) INJECTION 76% COMPARISON:  Plain films of earlier today.  No prior CT. FINDINGS: Cardiovascular: Large volume right-sided pulmonary emboli, including throughout the lobar branches and in the distal right pulmonary artery. Pulmonary  artery enlargement, outflow tract 3.2 cm. The RV-LV ratio is 47/49= 0.96 Aortic atherosclerosis. Tortuous thoracic aorta. Mild cardiomegaly, without pericardial effusion. Mediastinum/Nodes: No mediastinal or hilar adenopathy. Lungs/Pleura: No pleural fluid.  Clear lungs. Upper Abdomen: Moderate hepatic steatosis. Cholecystectomy. Normal imaged portions of the spleen, stomach, pancreas, adrenal glands. Upper abdominal wall laxity or hernia contains fat, including on image 68/series 5. Musculoskeletal: No acute osseous abnormality. Review of the MIP images confirms the above findings. IMPRESSION: 1. Large right-sided pulmonary emboli burden.  Positive for acute PE with CT evidence of right heart strain (RV/LV Ratio = 0.96) consistent with at least submassive (intermediate risk) PE. The presence of right heart strain has been associated with an increased risk of morbidity and mortality. Please activate Code PE by paging [phone]. 2.  Aortic Atherosclerosis (ICD10-I70.0). 3. Hepatic steatosis. These results were called by telephone at the time of interpretation on 08/21/2017 at 4:04 pm to [place], r.n., who verbally acknowledged these results. Electronically Signed   By: Jeronimo Greaves M.D.   On: 08/21/2017 16:08    Review Of Systems Constitutional: No fever, chills, weight loss or gain. Eyes: No vision change, wears glasses. No discharge or pain. Ears: No hearing loss, No tinnitus. Respiratory: No asthma, COPD, pneumonias. Positive shortness of breath. No hemoptysis. Cardiovascular: No chest pain, positive palpitation, leg edema. Gastrointestinal: No nausea, vomiting, diarrhea, constipation. No GI bleed. No hepatitis. Genitourinary: No dysuria, hematuria, kidney stone. No incontinance. Neurological: No headache, stroke, seizures.  Psychiatry: No psych facility admission for anxiety, depression, suicide. No detox. Skin: No rash. Musculoskeletal: Positive joint pain, fibromyalgia. No neck pain, back  pain. Lymphadenopathy: No lymphadenopathy. Hematology: No anemia or easy bruising.   Blood pressure (!) 150/88, pulse 84, temperature 98.1 F (36.7 C), temperature source Oral, resp. rate 14, height 5\' 4"  (1.626 m), weight 117.9 kg (260 lb), last menstrual period 08/21/2017, SpO2 96 %. Body mass index is 44.63 kg/m. General appearance: alert, cooperative, appears stated age and no distress Head: Normocephalic, atraumatic. Eyes: Hazel eyes, pink conjunctiva, corneas clear. PERRL, EOM's intact. Neck: No adenopathy, no carotid bruit, no JVD, supple, symmetrical, trachea midline and thyroid not enlarged. Resp: Clear to auscultation bilaterally. Cardio: Regular rate and rhythm, S1, S2 normal, II/VI systolic murmur, no click, rub or gallop GI: Soft, non-tender; bowel sounds normal; no organomegaly. Extremities: Left lower leg and knee joint swelling. No cyanosis or clubbing. Skin: Warm and dry.  Neurologic: Alert and oriented X 3, normal strength. Normal coordination and gait.  Assessment/Plan Acute PE Acute left leg DVT Obesity Hypertension Type II DM  Admit to step down IV heparin + warfarin. Echocardiogram. Consider thrombolytic treatment  Ricki Rodriguez, MD  08/21/2017, 4:37 PM

## 2017-08-21 NOTE — ED Notes (Signed)
Patient transported to X-ray 

## 2017-08-21 NOTE — ED Notes (Signed)
Pt does have some swelling to the left leg but she states that it is chronic

## 2017-08-21 NOTE — ED Provider Notes (Signed)
[facility] EMERGENCY DEPARTMENT Provider Note   CSN: [id number] Arrival date & time: 08/21/17  1045     History   Chief Complaint Chief Complaint  Patient presents with   Shortness of Breath   Weakness    HPI Stacie Marquez is a 66 y.o. female.  HPI Patient has had approximately 2 weeks of increasing fatigue and shortness of breath with exertion.  Therefore she thought it was just due to being very busy with a lot of work and the holiday.  She reports now however she gets a week and short of breath with only minor activity that she has to sit down and rest frequently.  Patient is not expressing chest pain.  She denies fever or productive cough.  She denies history of similar.  She has no known history of CHF.  She reports she has had chronic swelling in her left lower extremity.  No known history of DVT or PE.  No documented fevers. Past Medical History:  Diagnosis Date   Ankle swelling    bilateral   Anxiety    Diabetes mellitus without complication ([place])    Hypertension    Leg swelling    bilateral   Plantar fasciitis    left foot   Pre-diabetes    Rosacea     Patient Active Problem List   Diagnosis Date Noted   Cellulitis of abdominal wall 03/09/2017   Diabetes mellitus without complication ([place]) 09/98/3382   Morbid obesity ([place]) 02/15/2017   Skin tag 02/02/2017   Complex atypical endometrial hyperplasia 02/02/2017    Past Surgical History:  Procedure Laterality Date   ABDOMINAL HYSTERECTOMY     ACHILLES TENDON SURGERY     CESAREAN SECTION     CHOLECYSTECTOMY  07/27/2012   Procedure: LAPAROSCOPIC CHOLECYSTECTOMY WITH INTRAOPERATIVE CHOLANGIOGRAM;  Surgeon: Leighton Ruff, MD;  Location: [facility];  Service: General;  Laterality: N/A;   COLONOSCOPY     [place] N/A 01/25/2017   Procedure: [place];  Surgeon: Olga Millers, MD;  Location: [facility];   Service: Gynecology;  Laterality: N/A;   FRACTURE SURGERY     right arm fracture and shoulder disclocation   ROBOTIC ASSISTED TOTAL HYSTERECTOMY WITH BILATERAL SALPINGO OOPHERECTOMY N/A 02/15/2017   Procedure: XI ROBOTIC ASSISTED TOTAL HYSTERECTOMY WITH BILATERAL SALPINGO OOPHORECTOMY;  Surgeon: Everitt Amber, MD;  Location: [facility];  Service: Gynecology;  Laterality: N/A;   SENTINEL NODE BIOPSY N/A 02/15/2017   Procedure: SENTINEL NODE BIOPSY AND LEFT THIGH BIOPSY;  Surgeon: Everitt Amber, MD;  Location: [facility];  Service: Gynecology;  Laterality: N/A;    OB History    No data available       Home Medications    Prior to Admission medications   Medication Sig Start Date End Date Taking? Authorizing Provider  acetaminophen (TYLENOL) 500 MG tablet Take 500 mg by mouth every 6 (six) hours as needed for mild pain or moderate pain.   Yes [provider]  aspirin EC 81 MG tablet Take 81 mg by mouth daily.   Yes [provider]  busPIRone (BUSPAR) 5 MG tablet Take 10 mg by mouth 2 (two) times daily.    Yes [provider]  cyclobenzaprine (FLEXERIL) 10 MG tablet Take 10 mg by mouth at bedtime as needed. 05/19/17  Yes [provider]  furosemide (LASIX) 40 MG tablet Take 40 mg by mouth as needed for edema.   Yes [provider]  ibuprofen (ADVIL,MOTRIN) 200 MG tablet Take 400 mg by mouth every 6 (six) hours as needed for moderate pain.   Yes [provider]  losartan (COZAAR) 50 MG tablet Take 50 mg by mouth daily.   Yes [provider]  metFORMIN (GLUCOPHAGE-XR) 500 MG 24 hr tablet Take 500 mg by mouth at bedtime. 12/22/16  Yes [provider]  nitroGLYCERIN (NITROSTAT) 0.4 MG SL tablet Place 0.4 mg under the tongue every 5 (five) minutes as needed for chest pain.    [provider]  traMADol (ULTRAM) 50 MG tablet Take 1 tablet (50 mg total) by mouth every 6 (six) hours as needed. Patient not taking: Reported on  08/21/2017 02/16/17   Joylene John D, NP    Family History No family history on file.  Social History Social History   Tobacco Use   Smoking status: Never Smoker   Smokeless tobacco: Never Used  Substance Use Topics   Alcohol use: No   Drug use: No     Allergies   Penicillins   Review of Systems Review of Systems 10 Systems reviewed and are negative for acute change except as noted in the HPI.   Physical Exam Updated Vital Signs BP (!) 145/88    Pulse 84    Temp 98.1 F (36.7 C) (Oral)    Resp 12    Ht 5\' 4"  (1.626 m)    Wt 117.9 kg (260 lb)    LMP 08/21/2017    SpO2 94%    BMI 44.63 kg/m   Physical Exam  Constitutional: She is oriented to person, place, and time.  She is alert and nontoxic.  No respiratory distress at rest.  Obesity.  HENT:  Head: Normocephalic and atraumatic.  Mouth/Throat: Oropharynx is clear and moist.  Eyes: Conjunctivae and EOM are normal.  Neck: Neck supple.  Cardiovascular: Normal rate, regular rhythm, normal heart sounds and intact distal pulses.  Borderline tachycardia.  Monitor shows sinus rhythm in the 90s.  Pulmonary/Chest: Effort normal and breath sounds normal.  Abdominal: Soft. She exhibits no distension. There is no tenderness. There is no guarding.  Musculoskeletal: Normal range of motion. She exhibits edema.  Left lower extremity has 2+ edema.  Right lower extremity trace pitting edema.  Neurological: She is alert and oriented to person, place, and time. No cranial nerve deficit. She exhibits normal muscle tone. Coordination normal.  Skin: Skin is warm and dry.  Psychiatric: She has a normal mood and affect.     ED Treatments / Results  Labs (all labs ordered are listed, but only abnormal results are displayed) Labs Reviewed  BASIC METABOLIC PANEL - Abnormal; Notable for the following components:      Result Value   Glucose, Bld 120 (*)    All other components within normal limits  URINALYSIS, ROUTINE W REFLEX  MICROSCOPIC - Abnormal; Notable for the following components:   Squamous Epithelial / LPF 0-5 (*)    All other components within normal limits  URINE CULTURE  CBC  BRAIN NATRIURETIC PEPTIDE  LIPASE, BLOOD  HEPATIC FUNCTION PANEL  D-DIMER, QUANTITATIVE (NOT AT [facility])  HEPARIN LEVEL (UNFRACTIONATED)  CBG MONITORING, ED  I-STAT TROPONIN, ED    EKG  EKG Interpretation  Date/Time:  Sunday August 21 2017 11:16:05 EST Ventricular Rate:  100 PR Interval:  194 QRS Duration: 80 QT Interval:  368 QTC Calculation: 474 R Axis:   82 Text Interpretation:  Normal sinus rhythm Low voltage QRS no STEMI no old comparison Confirmed  by Charlesetta Shanks [phone]) on 08/21/2017 2:07:24 PM       Radiology Dg Chest 2 View  Result Date: 08/21/2017 CLINICAL DATA:  Dyspnea EXAM: CHEST  2 VIEW COMPARISON:  None. FINDINGS: Mild enlargement of the cardiopericardial silhouette. Otherwise normal mediastinal contour. No pneumothorax. No pleural effusion. Cephalization of the pulmonary vasculature without overt pulmonary edema. No acute consolidative airspace disease. Cholecystectomy clips are seen in the upper abdomen. IMPRESSION: Mild enlargement of the cardiopericardial silhouette. No overt pulmonary edema. No active pulmonary disease. Electronically Signed   By: Ilona Sorrel M.D.   On: 08/21/2017 12:35   Ct Angio Chest Pe W/cm &/or Wo Cm  Result Date: 08/21/2017 CLINICAL DATA:  Short of breath and weakness for 1 week. EXAM: CT ANGIOGRAPHY CHEST WITH CONTRAST TECHNIQUE: Multidetector CT imaging of the chest was performed using the standard protocol during bolus administration of intravenous contrast. Multiplanar CT image reconstructions and MIPs were obtained to evaluate the vascular anatomy. CONTRAST:  165mL ISOVUE-370 IOPAMIDOL (ISOVUE-370) INJECTION 76% COMPARISON:  Plain films of earlier today.  No prior CT. FINDINGS: Cardiovascular: Large volume right-sided pulmonary emboli, including throughout the lobar  branches and in the distal right pulmonary artery. Pulmonary artery enlargement, outflow tract 3.2 cm. The RV-LV ratio is 47/49= 0.96 Aortic atherosclerosis. Tortuous thoracic aorta. Mild cardiomegaly, without pericardial effusion. Mediastinum/Nodes: No mediastinal or hilar adenopathy. Lungs/Pleura: No pleural fluid.  Clear lungs. Upper Abdomen: Moderate hepatic steatosis. Cholecystectomy. Normal imaged portions of the spleen, stomach, pancreas, adrenal glands. Upper abdominal wall laxity or hernia contains fat, including on image 68/series 5. Musculoskeletal: No acute osseous abnormality. Review of the MIP images confirms the above findings. IMPRESSION: 1. Large right-sided pulmonary emboli burden. Positive for acute PE with CT evidence of right heart strain (RV/LV Ratio = 0.96) consistent with at least submassive (intermediate risk) PE. The presence of right heart strain has been associated with an increased risk of morbidity and mortality. Please activate Code PE by paging [phone]. 2.  Aortic Atherosclerosis (ICD10-I70.0). 3. Hepatic steatosis. These results were called by telephone at the time of interpretation on 08/21/2017 at 4:04 pm to [place], r.n., who verbally acknowledged these results. Electronically Signed   By: Abigail Miyamoto M.D.   On: 08/21/2017 16:08    Procedures Procedures (including critical care time) Angiocath insertion Performed by: Si Gaul  Consent: Verbal consent obtained. Risks and benefits: risks, benefits and alternatives were discussed Time out: Immediately prior to procedure a "time out" was called to verify the correct patient, procedure, equipment, support staff and site/side marked as required.  Preparation: Patient was prepped and draped in the usual sterile fashion.  Vein Location: right basilic in forearm  Ultrasound Guided  Gauge: 20  Normal blood return and flush without difficulty Patient tolerance: Patient tolerated the procedure well with no  immediate complications.  CRITICAL CARE Performed by: Si Gaul   Total critical care time:20 minutes  Critical care time was exclusive of separately billable procedures and treating other patients.  Critical care was necessary to treat or prevent imminent or life-threatening deterioration.  Critical care was time spent personally by me on the following activities: development of treatment plan with patient and/or surrogate as well as nursing, discussions with consultants, evaluation of patient's response to treatment, examination of patient, obtaining history from patient or surrogate, ordering and performing treatments and interventions, ordering and review of laboratory studies, ordering and review of radiographic studies, pulse oximetry and re-evaluation of patient's condition.  Medications Ordered in ED Medications  heparin ADULT  infusion 100 units/mL (25000 units/265mL sodium chloride 0.45%) (1,400 Units/hr Intravenous New Bag/Given 08/21/17 1604)  heparin bolus via infusion 4,000 Units (4,000 Units Intravenous Bolus from Bag 08/21/17 1604)  iopamidol (ISOVUE-370) 76 % injection (100 mLs  Contrast Given 08/21/17 1530)     Initial Impression / Assessment and Plan / ED Course  I have reviewed the triage vital signs and the nursing notes.  Pertinent labs & imaging results that were available during my care of the patient were reviewed by me and considered in my medical decision making (see chart for details).     Consult: Dr. Doylene Canard has evaluate the patient in the emergency department and will admit.  Final Clinical Impressions(s) / ED Diagnoses   Final diagnoses:  Weakness  Dyspnea on exertion  Acute deep vein thrombosis (DVT) of femoral vein of left lower extremity (HCC)  Other acute pulmonary embolism with acute cor pulmonale (HCC)   Is alert and appropriate.  She does not have respiratory distress at rest.  Pressure is stable.  Patient has symptoms of increasing  dyspnea and weakness.  Patient has large left DVT and submassive PE.  Heparin initiated in the emergency department.  Admission to Dr. Doylene Canard. ED Discharge Orders    None       Charlesetta Shanks, MD 08/21/17 [phone]

## 2017-08-21 NOTE — ED Triage Notes (Signed)
Per Pt, Pt is coming from home with complaints of intermittent weakness and SOB for the past couple weeks. Pt denies any Chest pain, and reports that she was contacted by MD Harwani to be evaluated.   Dr. Cydney Ok would like to be called after Labs are resulted. [phone]

## 2017-08-21 NOTE — Progress Notes (Signed)
\ °  Echocardiogram 2D Echocardiogram has been performed.  Stacie Marquez T Reagen Haberman 08/21/2017, 6:36 PM

## 2017-08-21 NOTE — ED Notes (Signed)
Patient transported to CT °

## 2017-08-21 NOTE — Progress Notes (Signed)
Received to 2J03 via stretcher from ED. Denies Pain. Denies SOB or other C/O. Oriented to room/unit. Call bell in reach. Heparin gtt at 14 ml/hr per PIV with no s/s at site. Family into room.

## 2017-08-21 NOTE — Progress Notes (Signed)
ANTICOAGULATION CONSULT NOTE - F/u Consult  Pharmacy Consult for Heparin Indication: DVT / rule out PE  Allergies  Allergen Reactions   Penicillins Rash   Patient Measurements: Height: 5\' 4"  (162.6 cm) Weight: 265 lb 8 oz (120.4 kg) IBW/kg (Calculated) : 54.7 Heparin Dosing Weight: 85 kg  Vital Signs: Temp: 97.3 F (36.3 C) (12/30 1946) Temp Source: Oral (12/30 1946) BP: 137/78 (12/30 1946) Pulse Rate: 73 (12/30 1946)  Labs: Recent Labs    08/21/17 1151 08/21/17 2100  HGB 14.6  --   HCT 42.3  --   PLT 162  --   HEPARINUNFRC  --  0.40  CREATININE 0.94  --     Estimated Creatinine Clearance: 73.2 mL/min (by C-G formula based on SCr of 0.94 mg/dL).   Medical History: Past Medical History:  Diagnosis Date   Ankle swelling    bilateral   Anxiety    Diabetes mellitus without complication (HCC)    Hypertension    Leg swelling    bilateral   Plantar fasciitis    left foot   Pre-diabetes    Rosacea    Assessment: 66 year old with new DVT / rule out PE. Pharmacy to dose heparin/warfarin.   CT report states large R-side PE with right heart strain (RV/LV = 0.96).   Heparin level is therapeutic at 0.4. No s/s bleeding reported.   Goal of Therapy:  Heparin level 0.3-0.7 units/ml Monitor platelets by anticoagulation protocol: Yes   Plan:  Continue heparin gtt at 1400 units/hr Heparin level in 6 hrs to confirm  Daily heparin level, INR, and CBC Monitor for s/s bleeding  Lavonda Jumbo, PharmD Clinical Pharmacist 08/21/17 11:56 PM

## 2017-08-21 NOTE — ED Notes (Signed)
Family and pt  updated , admitting MD at bedside

## 2017-08-21 NOTE — Progress Notes (Signed)
*  Preliminary Results* Left lower extremity venous duplex completed. Left lower extremity is positive for acute deep vein thrombosis involving the left femoral, popliteal, posterior tibial, and gastrocnemius veins. There is no evidence of left Baker's cyst.  Preliminary results discussed with Dr. Johnney Killian.  08/21/2017 1:38 PM  Maudry Mayhew, BS, RVT, RDCS, RDMS

## 2017-08-21 NOTE — ED Notes (Signed)
Ordered a dinner tray for pt

## 2017-08-21 NOTE — Progress Notes (Addendum)
ANTICOAGULATION CONSULT NOTE - Initial Consult  Pharmacy Consult for Heparin Indication: DVT / rule out PE  Allergies  Allergen Reactions   Penicillins Rash    Patient Measurements: Height: 5\' 4"  (162.6 cm) Weight: 260 lb (117.9 kg) IBW/kg (Calculated) : 54.7 Heparin Dosing Weight: 85 kg  Vital Signs: Temp: 98.1 F (36.7 C) (12/30 1120) Temp Source: Oral (12/30 1120) BP: 143/89 (12/30 1215) Pulse Rate: 81 (12/30 1215)  Labs: Recent Labs    08/21/17 1151  HGB 14.6  HCT 42.3  PLT 162  CREATININE 0.94    Estimated Creatinine Clearance: 72.3 mL/min (by C-G formula based on SCr of 0.94 mg/dL).   Medical History: Past Medical History:  Diagnosis Date   Ankle swelling    bilateral   Anxiety    Diabetes mellitus without complication (HCC)    Hypertension    Leg swelling    bilateral   Plantar fasciitis    left foot   Pre-diabetes    Rosacea     Assessment: 66 year old with new DVT / rule out PE to start heparin  Goal of Therapy:  Heparin level 0.3-0.7 units/ml Monitor platelets by anticoagulation protocol: Yes   Plan:  Heparin 4000 units iv bolus x 1 Heparin drip at  1400 units / hr Heparin level 6 hours after heparin starts Daily heparin level, CBC  Thank you Anette Guarneri, PharmD [phone]  08/21/2017,2:06 PM   Pharmacy has now been consulted to start warfarin for confirmed PE with right heart strain on CT. Baseline CBC stable.  Warfarin 7.5 mg X 1 tonight  Daily INR  Susa Raring, PharmD, [place] PGY2 Infectious Diseases Pharmacy Resident [phone]

## 2017-08-22 LAB — ECHOCARDIOGRAM COMPLETE
AV peak Index: 0.95
AV pk vel: 175 cm/s
AVAREAVTI: 2.26 cm2
AVPG: 12 mmHg
Ao pk vel: 0.72 m/s
E/e' ratio: 14.32
EWDT: 275 ms
FS: 46 % — AB (ref 28–44)
HEIGHTINCHES: 64 in
IVS/LV PW RATIO, ED: 0.83
LA ID, A-P, ES: 34 mm
LA diam end sys: 34 mm
LA diam index: 1.43 cm/m2
LA vol A4C: 59.9 ml
LDCA: 3.14 cm2
LV E/e'average: 14.32
LV TDI E'LATERAL: 6.34
LV TDI E'MEDIAL: 7.21
LV dias vol index: 34 mL/m2
LVDIAVOL: 80 mL (ref 46–106)
LVEEMED: 14.32
LVELAT: 6.34 cm/s
LVOT SV: 78 mL
LVOT VTI: 24.8 cm
LVOT peak grad rest: 6 mmHg
LVOTD: 20 mm
LVOTPV: 126 cm/s
LVSYSVOL: 33 mL (ref 14–42)
LVSYSVOLIN: 14 mL/m2
MV Dec: 275
MV Peak grad: 3 mmHg
MVPKAVEL: 122 m/s
MVPKEVEL: 90.8 m/s
PW: 12.7 mm — AB (ref 0.6–1.1)
RV LATERAL S' VELOCITY: 16.7 cm/s
RV TAPSE: 27.5 mm
Simpson's disk: 59
Stroke v: 47 ml
WEIGHTICAEL: 4160 [oz_av]

## 2017-08-22 LAB — PROTIME-INR
INR: 1.08
Prothrombin Time: 13.9 seconds (ref 11.4–15.2)

## 2017-08-22 LAB — CBC
HEMATOCRIT: 41.3 % (ref 36.0–46.0)
HEMOGLOBIN: 13.8 g/dL (ref 12.0–15.0)
MCH: 29.2 pg (ref 26.0–34.0)
MCHC: 33.4 g/dL (ref 30.0–36.0)
MCV: 87.5 fL (ref 78.0–100.0)
Platelets: 189 10*3/uL (ref 150–400)
RBC: 4.72 MIL/uL (ref 3.87–5.11)
RDW: 13.8 % (ref 11.5–15.5)
WBC: 7 10*3/uL (ref 4.0–10.5)

## 2017-08-22 LAB — BASIC METABOLIC PANEL
Anion gap: 12 (ref 5–15)
BUN: 11 mg/dL (ref 6–20)
CHLORIDE: 102 mmol/L (ref 101–111)
CO2: 25 mmol/L (ref 22–32)
CREATININE: 0.77 mg/dL (ref 0.44–1.00)
Calcium: 8.8 mg/dL — ABNORMAL LOW (ref 8.9–10.3)
GFR calc non Af Amer: >60 mL/min (ref >60)
Glucose, Bld: 130 mg/dL — ABNORMAL HIGH (ref 65–99)
Potassium: 3.5 mmol/L (ref 3.5–5.1)
Sodium: 139 mmol/L (ref 135–145)

## 2017-08-22 LAB — URINE CULTURE: Culture: NO GROWTH

## 2017-08-22 LAB — HEPARIN LEVEL (UNFRACTIONATED): Heparin Unfractionated: 0.39 IU/mL (ref 0.30–0.70)

## 2017-08-22 MED ORDER — WARFARIN SODIUM 7.5 MG PO TABS
7.5000 mg | ORAL_TABLET | Freq: Once | ORAL | Status: AC
  Administered 2017-08-22: 7.5 mg via ORAL
  Filled 2017-08-22: qty 1

## 2017-08-22 NOTE — Progress Notes (Signed)
Ref: Estanislado Pandy, MD   Subjective:  VS stable. Less short of breath post IV heparin. Echocardiogram with preserved LV and RV systolic function.   Objective:  Vital Signs in the last 24 hours: Temp:  [97.3 F (36.3 C)-99.6 F (37.6 C)] 97.7 F (36.5 C) (12/31 0451) Pulse Rate:  [69-99] 72 (12/31 0451) Cardiac Rhythm: Heart block (12/31 0701) Resp:  [10-20] 17 (12/30 1946) BP: (119-154)/(52-92) 123/52 (12/31 0451) SpO2:  [93 %-96 %] 94 % (12/31 0451) Weight:  [117.9 kg (260 lb)-120.5 kg (265 lb 10.5 oz)] 120.5 kg (265 lb 10.5 oz) (12/31 0451)  Physical Exam: BP Readings from Last 1 Encounters:  08/22/17 (!) 123/52     Wt Readings from Last 1 Encounters:  08/22/17 120.5 kg (265 lb 10.5 oz)    Weight change:  Body mass index is 45.6 kg/m. HEENT: [place]/AT, Eyes-Hazel, PERL, EOMI, Conjunctiva-Pink, Sclera-Non-icteric Neck: No JVD, No bruit, Trachea midline. Lungs:  Clear, Bilateral. Cardiac:  Regular rhythm, normal S1 and S2, no S3. II/VI systolic murmur. Abdomen:  Soft, non-tender. BS present. Extremities:  Left edema appears decreasing. No cyanosis. No clubbing. CNS: AxOx3, Cranial nerves grossly intact, moves all 4 extremities.  Skin: Warm and dry.   Intake/Output from previous day: 12/30 0701 - 12/31 0700 In: 429.7 [P.O.:240; I.V.:189.7] Out: 400 [Urine:400]    Lab Results: BMET    Component Value Date/Time   NA 139 08/22/2017 0330   NA 141 08/21/2017 1151   NA 139 02/16/2017 0553   K 3.5 08/22/2017 0330   K 3.7 08/21/2017 1151   K 4.0 02/16/2017 0553   CL 102 08/22/2017 0330   CL 105 08/21/2017 1151   CL 105 02/16/2017 0553   CO2 25 08/22/2017 0330   CO2 25 08/21/2017 1151   CO2 26 02/16/2017 0553   GLUCOSE 130 (H) 08/22/2017 0330   GLUCOSE 120 (H) 08/21/2017 1151   GLUCOSE 182 (H) 02/16/2017 0553   BUN 11 08/22/2017 0330   BUN 12 08/21/2017 1151   BUN 13 02/16/2017 0553   CREATININE 0.77 08/22/2017 0330   CREATININE 0.94 08/21/2017 1151   CREATININE  0.83 02/16/2017 0553   CALCIUM 8.8 (L) 08/22/2017 0330   CALCIUM 8.9 08/21/2017 1151   CALCIUM 9.0 02/16/2017 0553   GFRNONAA >60 08/22/2017 0330   GFRNONAA >60 08/21/2017 1151   GFRNONAA >60 02/16/2017 0553   GFRAA >60 08/22/2017 0330   GFRAA >60 08/21/2017 1151   GFRAA >60 02/16/2017 0553   CBC    Component Value Date/Time   WBC 7.0 08/22/2017 0330   RBC 4.72 08/22/2017 0330   HGB 13.8 08/22/2017 0330   HCT 41.3 08/22/2017 0330   PLT 189 08/22/2017 0330   MCV 87.5 08/22/2017 0330   MCH 29.2 08/22/2017 0330   MCHC 33.4 08/22/2017 0330   RDW 13.8 08/22/2017 0330   HEPATIC Function Panel Recent Labs    08/21/17 2100  PROT 6.1*   HEMOGLOBIN A1C No components found for: HGA1C,  MPG CARDIAC ENZYMES No results found for: CKTOTAL, CKMB, CKMBINDEX, TROPONINI BNP No results for input(s): PROBNP in the last 8760 hours. TSH No results for input(s): TSH in the last 8760 hours. CHOLESTEROL No results for input(s): CHOL in the last 8760 hours.  Scheduled Meds: . busPIRone  10 mg Oral BID  . losartan  50 mg Oral Daily  . warfarin  7.5 mg Oral ONCE-1800  . Warfarin - Pharmacist Dosing Inpatient   Does not apply q1800   Continuous Infusions: .  heparin 1,400 Units/hr (08/22/17 0537)   PRN Meds:.acetaminophen, cyclobenzaprine, nitroGLYCERIN  Assessment/Plan: Acute pulmonary embolism Acute left leg DVT Obesity Hypertension Type II DM  Continue IV heparin.   LOS: 1 day    Orpah Cobb  MD  08/22/2017, 11:06 AM

## 2017-08-22 NOTE — Progress Notes (Signed)
ANTICOAGULATION CONSULT NOTE - F/u Consult  Pharmacy Consult for Heparin/Warfarin Indication: DVT/PE  Allergies  Allergen Reactions   Penicillins Rash   Patient Measurements: Height: 5\' 4"  (162.6 cm) Weight: 265 lb 10.5 oz (120.5 kg) IBW/kg (Calculated) : 54.7 Heparin Dosing Weight: 85 kg  Vital Signs: Temp: 97.7 F (36.5 C) (12/31 0451) Temp Source: Oral (12/31 0451) BP: 123/52 (12/31 0451) Pulse Rate: 72 (12/31 0451)  Labs: Recent Labs    08/21/17 1151 08/21/17 2100 08/22/17 0330  HGB 14.6  --  13.8  HCT 42.3  --  41.3  PLT 162  --  189  LABPROT  --   --  13.9  INR  --   --  1.08  HEPARINUNFRC  --  0.40 0.39  CREATININE 0.94  --  0.77    Estimated Creatinine Clearance: 86.1 mL/min (by C-G formula based on SCr of 0.77 mg/dL).   Medical History: Past Medical History:  Diagnosis Date   Ankle swelling    bilateral   Anxiety    Diabetes mellitus without complication (HCC)    Hypertension    Leg swelling    bilateral   Plantar fasciitis    left foot   Pre-diabetes    Rosacea    Assessment: 66 year old female admitted to [facility] with new left leg DVT and PE.  CT report shows large R-side PE with right heart strain (RV/LV = 0.96). Pharmacy to dose heparin/warfarin.  Heparin level is therapeutic at 0.39. INR is 1.08. CBC stable, no s/s bleeding reported. Will continue heparin bridge until INR is therapeutic.   Goal of Therapy:  Heparin level 0.3-0.7 units/ml Monitor platelets by anticoagulation protocol: Yes  INR 2-3   Plan:  Continue heparin gtt at 1400 units/hr  Warfarin 7.5 mg po x 1 Daily heparin level, INR, and CBC Monitor for s/s bleeding  Leroy Libman, PharmD Pharmacy Resident Pager: [phone]

## 2017-08-23 ENCOUNTER — Other Ambulatory Visit: Payer: Self-pay

## 2017-08-23 LAB — CBC
HEMATOCRIT: 38.8 % (ref 36.0–46.0)
Hemoglobin: 13 g/dL (ref 12.0–15.0)
MCH: 29.3 pg (ref 26.0–34.0)
MCHC: 33.5 g/dL (ref 30.0–36.0)
MCV: 87.4 fL (ref 78.0–100.0)
PLATELETS: 190 10*3/uL (ref 150–400)
RBC: 4.44 MIL/uL (ref 3.87–5.11)
RDW: 14 % (ref 11.5–15.5)
WBC: 6.1 10*3/uL (ref 4.0–10.5)

## 2017-08-23 LAB — PROTIME-INR
INR: 1.09
Prothrombin Time: 14 seconds (ref 11.4–15.2)

## 2017-08-23 LAB — HEPARIN LEVEL (UNFRACTIONATED): Heparin Unfractionated: 0.41 IU/mL (ref 0.30–0.70)

## 2017-08-23 MED ORDER — ALBUTEROL SULFATE (2.5 MG/3ML) 0.083% IN NEBU
2.5000 mg | INHALATION_SOLUTION | Freq: Four times a day (QID) | RESPIRATORY_TRACT | Status: DC
  Administered 2017-08-23: 2.5 mg via RESPIRATORY_TRACT
  Filled 2017-08-23: qty 3

## 2017-08-23 MED ORDER — ALBUTEROL SULFATE (2.5 MG/3ML) 0.083% IN NEBU
2.5000 mg | INHALATION_SOLUTION | Freq: Three times a day (TID) | RESPIRATORY_TRACT | Status: DC
  Administered 2017-08-23 – 2017-08-25 (×6): 2.5 mg via RESPIRATORY_TRACT
  Filled 2017-08-23 (×7): qty 3

## 2017-08-23 MED ORDER — AZITHROMYCIN 250 MG PO TABS
250.0000 mg | ORAL_TABLET | Freq: Every day | ORAL | Status: AC
  Administered 2017-08-24 – 2017-08-27 (×4): 250 mg via ORAL
  Filled 2017-08-23 (×4): qty 1

## 2017-08-23 MED ORDER — WARFARIN SODIUM 7.5 MG PO TABS
7.5000 mg | ORAL_TABLET | Freq: Once | ORAL | Status: AC
  Administered 2017-08-23: 7.5 mg via ORAL
  Filled 2017-08-23: qty 1

## 2017-08-23 MED ORDER — AZITHROMYCIN 250 MG PO TABS
500.0000 mg | ORAL_TABLET | Freq: Every day | ORAL | Status: AC
  Administered 2017-08-23: 500 mg via ORAL
  Filled 2017-08-23 (×2): qty 2

## 2017-08-23 NOTE — Progress Notes (Addendum)
Ref: Manon Hilding, MD   Subjective:  Has cough, cold and mild wheezing. Mild hypoxia last night.  Objective:  Vital Signs in the last 24 hours: Temp:  [98.1 F (36.7 C)-98.5 F (36.9 C)] 98.1 F (36.7 C) (01/01 0323) Pulse Rate:  [69-76] 76 (01/01 0323) Cardiac Rhythm: Normal sinus rhythm (01/01 0900) Resp:  [12-15] 13 (01/01 0323) BP: (113-162)/(61-79) 162/76 (01/01 0955) SpO2:  [93 %-96 %] 94 % (01/01 0323) Weight:  [119.7 kg (263 lb 14.4 oz)] 119.7 kg (263 lb 14.4 oz) (01/01 0323)  Physical Exam: BP Readings from Last 1 Encounters:  08/23/17 (!) 162/76     Wt Readings from Last 1 Encounters:  08/23/17 119.7 kg (263 lb 14.4 oz)    Weight change: 1.769 kg (3 lb 14.4 oz) Body mass index is 45.3 kg/m. HEENT: [place]/AT, Eyes-Hazel, PERL, EOMI, Conjunctiva-Pink, Sclera-Non-icteric Neck: No JVD, No bruit, Trachea midline. Lungs:  Wheezing on forced expiration Bilateral. Cardiac:  Regular rhythm, normal S1 and S2, no S3. II/VI systolic murmur. Abdomen:  Soft, non-tender. BS present. Extremities:  Left lower leg edema present. No cyanosis. No clubbing. CNS: AxOx3, Cranial nerves grossly intact, moves all 4 extremities.  Skin: Warm and dry.   Intake/Output from previous day: 12/31 0701 - 01/01 0700 In: 1206 [P.O.:1080; I.V.:126] Out: 700 [Urine:700]    Lab Results: BMET    Component Value Date/Time   NA 139 08/22/2017 0330   NA 141 08/21/2017 1151   NA 139 02/16/2017 0553   K 3.5 08/22/2017 0330   K 3.7 08/21/2017 1151   K 4.0 02/16/2017 0553   CL 102 08/22/2017 0330   CL 105 08/21/2017 1151   CL 105 02/16/2017 0553   CO2 25 08/22/2017 0330   CO2 25 08/21/2017 1151   CO2 26 02/16/2017 0553   GLUCOSE 130 (H) 08/22/2017 0330   GLUCOSE 120 (H) 08/21/2017 1151   GLUCOSE 182 (H) 02/16/2017 0553   BUN 11 08/22/2017 0330   BUN 12 08/21/2017 1151   BUN 13 02/16/2017 0553   CREATININE 0.77 08/22/2017 0330   CREATININE 0.94 08/21/2017 1151   CREATININE 0.83 02/16/2017  0553   CALCIUM 8.8 (L) 08/22/2017 0330   CALCIUM 8.9 08/21/2017 1151   CALCIUM 9.0 02/16/2017 0553   GFRNONAA >60 08/22/2017 0330   GFRNONAA >60 08/21/2017 1151   GFRNONAA >60 02/16/2017 0553   GFRAA >60 08/22/2017 0330   GFRAA >60 08/21/2017 1151   GFRAA >60 02/16/2017 0553   CBC    Component Value Date/Time   WBC 6.1 08/23/2017 0302   RBC 4.44 08/23/2017 0302   HGB 13.0 08/23/2017 0302   HCT 38.8 08/23/2017 0302   PLT 190 08/23/2017 0302   MCV 87.4 08/23/2017 0302   MCH 29.3 08/23/2017 0302   MCHC 33.5 08/23/2017 0302   RDW 14.0 08/23/2017 0302   HEPATIC Function Panel Recent Labs    08/21/17 2100  PROT 6.1*   HEMOGLOBIN A1C No components found for: HGA1C,  MPG CARDIAC ENZYMES No results found for: CKTOTAL, CKMB, CKMBINDEX, TROPONINI BNP No results for input(s): PROBNP in the last 8760 hours. TSH No results for input(s): TSH in the last 8760 hours. CHOLESTEROL No results for input(s): CHOL in the last 8760 hours.  Scheduled Meds:  albuterol  2.5 mg Nebulization QID   azithromycin  500 mg Oral Daily   Followed by   Derrill Memo ON 08/24/2017] azithromycin  250 mg Oral Daily   busPIRone  10 mg Oral BID   losartan  50 mg Oral Daily   warfarin  7.5 mg Oral ONCE-1800   Warfarin - Pharmacist Dosing Inpatient   Does not apply q1800   Continuous Infusions:  heparin 1,400 Units/hr (08/22/17 2322)   PRN Meds:.acetaminophen, cyclobenzaprine, nitroGLYCERIN  Assessment/Plan: Acute pulmonary embolism Acute left leg DVT Morbid obesity Hypertension Type 2 diabetes mellitus Acute bronchitis   Continue IV heparin and Coumadin. Start albuterol nebulizer treatment and azithromycin.   LOS: 2 days    Dixie Dials  MD  08/23/2017, 12:29 PM

## 2017-08-23 NOTE — Progress Notes (Signed)
Pt noted to desaturate on room air while sleeping into low 80s.  Upon awakening pt, oxygen sats increased to 98-100%.  Pt states she has snored for years.  O2 at 2L applied per [place] while sleeping.  Will continue to monitor pt closely.

## 2017-08-23 NOTE — Plan of Care (Signed)
Pt stable. On IV heparin/coumadin bridge. Awaiting a therapeutic INR; currently 1.09

## 2017-08-23 NOTE — Progress Notes (Signed)
ANTICOAGULATION CONSULT NOTE - F/u Consult  Pharmacy Consult for Heparin/Warfarin Indication: DVT/PE  Allergies  Allergen Reactions   Penicillins Rash   Patient Measurements: Height: 5\' 4"  (162.6 cm) Weight: 263 lb 14.4 oz (119.7 kg) IBW/kg (Calculated) : 54.7 Heparin Dosing Weight: 85 kg  Vital Signs: Temp: 98.1 F (36.7 C) (01/01 0323) Temp Source: Oral (01/01 0323) BP: 162/76 (01/01 0955) Pulse Rate: 76 (01/01 0323)  Labs: Recent Labs    08/21/17 1151 08/21/17 2100 08/22/17 0330 08/23/17 0302  HGB 14.6  --  13.8 13.0  HCT 42.3  --  41.3 38.8  PLT 162  --  189 190  LABPROT  --   --  13.9 14.0  INR  --   --  1.08 1.09  HEPARINUNFRC  --  0.40 0.39 0.41  CREATININE 0.94  --  0.77  --     Estimated Creatinine Clearance: 85.7 mL/min (by C-G formula based on SCr of 0.77 mg/dL).   Medical History: Past Medical History:  Diagnosis Date   Ankle swelling    bilateral   Anxiety    Diabetes mellitus without complication (HCC)    Hypertension    Leg swelling    bilateral   Plantar fasciitis    left foot   Pre-diabetes    Rosacea    Assessment: 67 year old female admitted to [facility] with new left leg DVT and PE.  CT report shows large R-side PE with right heart strain (RV/LV = 0.96). Pharmacy to dose heparin/warfarin.  Heparin level is therapeutic at 0.41 on 1400/hr, and INR is subtherapeutic at 1.09. CBC wnl and no bleeding observed at this time.    Goal of Therapy:  Heparin level 0.3-0.7 units/ml Monitor platelets by anticoagulation protocol: Yes  INR 2-3   Plan:  Continue heparin gtt at 1400 units/hr until INR therapeutic Repeat warfarin 7.5mg  PO x 1 Daily heparin level, INR, and CBC Monitor for s/s bleeding  Bertis Ruddy, PharmD Pharmacy Resident Pager #: [phone] 08/23/2017 10:06 AM

## 2017-08-24 LAB — CBC
HCT: 41.2 % (ref 36.0–46.0)
Hemoglobin: 13.5 g/dL (ref 12.0–15.0)
MCH: 29.2 pg (ref 26.0–34.0)
MCHC: 32.8 g/dL (ref 30.0–36.0)
MCV: 89.2 fL (ref 78.0–100.0)
PLATELETS: 212 10*3/uL (ref 150–400)
RBC: 4.62 MIL/uL (ref 3.87–5.11)
RDW: 14.3 % (ref 11.5–15.5)
WBC: 6.5 10*3/uL (ref 4.0–10.5)

## 2017-08-24 LAB — GLUCOSE, CAPILLARY
GLUCOSE-CAPILLARY: 136 mg/dL — AB (ref 65–99)
GLUCOSE-CAPILLARY: 117 mg/dL — AB (ref 65–99)
GLUCOSE-CAPILLARY: 151 mg/dL — AB (ref 65–99)
Glucose-Capillary: 123 mg/dL — ABNORMAL HIGH (ref 65–99)

## 2017-08-24 LAB — HEPARIN LEVEL (UNFRACTIONATED): HEPARIN UNFRACTIONATED: 0.36 [IU]/mL (ref 0.30–0.70)

## 2017-08-24 LAB — PROTIME-INR
INR: 1.25
Prothrombin Time: 15.6 seconds — ABNORMAL HIGH (ref 11.4–15.2)

## 2017-08-24 MED ORDER — WARFARIN SODIUM 7.5 MG PO TABS
7.5000 mg | ORAL_TABLET | Freq: Once | ORAL | Status: AC
  Administered 2017-08-24: 7.5 mg via ORAL
  Filled 2017-08-24: qty 1

## 2017-08-24 NOTE — Progress Notes (Signed)
ANTICOAGULATION CONSULT NOTE - F/u Consult  Pharmacy Consult for Heparin/Warfarin Indication: DVT/PE  Allergies  Allergen Reactions   Penicillins Rash   Patient Measurements: Height: 5\' 4"  (162.6 cm) Weight: 262 lb 14.4 oz (119.3 kg) IBW/kg (Calculated) : 54.7 Heparin Dosing Weight: 85 kg  Vital Signs: Temp: 98 F (36.7 C) (01/02 0456) Temp Source: Oral (01/02 0456) BP: 149/73 (01/02 0456) Pulse Rate: 73 (01/02 0456)  Labs: Recent Labs    08/21/17 1151  08/22/17 0330 08/23/17 0302 08/24/17 0442  HGB 14.6  --  13.8 13.0 13.5  HCT 42.3  --  41.3 38.8 41.2  PLT 162  --  189 190 212  LABPROT  --   --  13.9 14.0 15.6*  INR  --   --  1.08 1.09 1.25  HEPARINUNFRC  --    < > 0.39 0.41 0.36  CREATININE 0.94  --  0.77  --   --    < > = values in this interval not displayed.    Estimated Creatinine Clearance: 85.5 mL/min (by C-G formula based on SCr of 0.77 mg/dL).   Medical History: Past Medical History:  Diagnosis Date   Ankle swelling    bilateral   Anxiety    Diabetes mellitus without complication (HCC)    Hypertension    Leg swelling    bilateral   Plantar fasciitis    left foot   Pre-diabetes    Rosacea    Assessment: 67 year old female admitted to [facility] with new left leg DVT and PE.  CT report shows large R-side PE with right heart strain (RV/LV = 0.96). Pharmacy to dose heparin/warfarin. Started Azithromycin on 08/23/17.  Heparin level is therapeutic at 0.36 on 1400/hr, and INR is subtherapeutic at 1.25. CBC wnl and no bleeding observed at this time.    Goal of Therapy:  Heparin level 0.3-0.7 units/ml Monitor platelets by anticoagulation protocol: Yes  INR 2-3   Plan:  Continue heparin gtt at 1400 units/hr until INR therapeutic Repeat warfarin 7.5mg  PO x 1 Daily heparin level, INR, and CBC Monitor for s/s bleeding  Thank you for allowing [place] to participate in this patients care.  Mariella Saa, PharmD Clinical Phone for 08/24/2017 till 3:30 x  [phone]  08/24/2017 10:41 AM

## 2017-08-24 NOTE — Progress Notes (Signed)
Subjective:  Denies any chest pain or shortness of breath.  Tolerating heparin and Coumadin. No evidence of bleeding.  Objective:  Vital Signs in the last 24 hours: Temp:  [97.8 F (36.6 C)-98.5 F (36.9 C)] 98 F (36.7 C) (01/02 1204) Pulse Rate:  [62-80] 62 (01/02 1204) Resp:  [14-18] 18 (01/02 0456) BP: (97-149)/(47-77) 145/77 (01/02 1204) SpO2:  [95 %-98 %] 97 % (01/02 1204) Weight:  [119.3 kg (262 lb 14.4 oz)] 119.3 kg (262 lb 14.4 oz) (01/02 0456)  Intake/Output from previous day: 01/01 0701 - 01/02 0700 In: 514 [P.O.:360; I.V.:154] Out: 1350 [Urine:1350] Intake/Output from this shift: Total I/O In: 720 [P.O.:720] Out: -   Physical Exam: Neck: no adenopathy, no carotid bruit, no JVD and supple, symmetrical, trachea midline Lungs: clear to auscultation bilaterally Heart: regular rate and rhythm, S1, S2 normal and 2/6systolic murmur noted Abdomen: soft, non-tender; bowel sounds normal; no masses,  no organomegaly Extremities: no clubbing, cyanosis, edema, left leg noted  Lab Results: Recent Labs    08/23/17 0302 08/24/17 0442  WBC 6.1 6.5  HGB 13.0 13.5  PLT 190 212   Recent Labs    08/22/17 0330  NA 139  K 3.5  CL 102  CO2 25  GLUCOSE 130*  BUN 11  CREATININE 0.77   No results for input(s): TROPONINI in the last 72 hours.  Invalid input(s): CK, MB Hepatic Function Panel Recent Labs    08/21/17 2100  PROT 6.1*  ALBUMIN 3.3*  AST 28  ALT 24  ALKPHOS 58  BILITOT 0.7  BILIDIR 0.1  IBILI 0.6   No results for input(s): CHOL in the last 72 hours. No results for input(s): PROTIME in the last 72 hours.  Imaging: Imaging results have been reviewed and No results found.  Cardiac Studies:  Assessment/Plan:  Acute right pulmonary embolism Acute left leg DVT Morbid obesity Hypertension Type 2 diabetes mellitus Acute bronchitis  Plan Continue present management. Will DC home once INR above 2   LOS: 3 days    Charolette Forward 08/24/2017, 1:11  PM

## 2017-08-24 NOTE — Plan of Care (Signed)
Pt has been stable this admission. IV heparin gtt infusing and pt has been receiving PO coumadin. INR subtherapeutic at this time at 1.25. Patiently awaiting therapeutic level. Pt in compliance and agreement of this treatment plan.

## 2017-08-25 LAB — CBC
HCT: 38.8 % (ref 36.0–46.0)
Hemoglobin: 12.5 g/dL (ref 12.0–15.0)
MCH: 28.2 pg (ref 26.0–34.0)
MCHC: 32.2 g/dL (ref 30.0–36.0)
MCV: 87.4 fL (ref 78.0–100.0)
PLATELETS: 210 10*3/uL (ref 150–400)
RBC: 4.44 MIL/uL (ref 3.87–5.11)
RDW: 14.2 % (ref 11.5–15.5)
WBC: 6.4 10*3/uL (ref 4.0–10.5)

## 2017-08-25 LAB — GLUCOSE, CAPILLARY
GLUCOSE-CAPILLARY: 124 mg/dL — AB (ref 65–99)
GLUCOSE-CAPILLARY: 136 mg/dL — AB (ref 65–99)
Glucose-Capillary: 118 mg/dL — ABNORMAL HIGH (ref 65–99)
Glucose-Capillary: 106 mg/dL — ABNORMAL HIGH (ref 65–99)

## 2017-08-25 LAB — HEPARIN LEVEL (UNFRACTIONATED): HEPARIN UNFRACTIONATED: 0.39 [IU]/mL (ref 0.30–0.70)

## 2017-08-25 LAB — PROTIME-INR
INR: 1.45
PROTHROMBIN TIME: 17.5 s — AB (ref 11.4–15.2)

## 2017-08-25 MED ORDER — WARFARIN SODIUM 7.5 MG PO TABS
7.5000 mg | ORAL_TABLET | Freq: Once | ORAL | Status: AC
  Administered 2017-08-25: 7.5 mg via ORAL
  Filled 2017-08-25: qty 1

## 2017-08-25 NOTE — Progress Notes (Signed)
Subjective:  Denies any chest pain or shortness of breath states coughing has improved after starting Z-Pak. Tolerating heparin and Coumadin INR slowly trending upward.  Objective:  Vital Signs in the last 24 hours: Temp:  [97.6 F (36.4 C)-98.6 F (37 C)] 98.6 F (37 C) (01/03 0747) Pulse Rate:  [62-85] 68 (01/03 0747) Resp:  [19] 19 (01/02 2206) BP: (114-145)/(57-80) 123/77 (01/03 0747) SpO2:  [93 %-99 %] 95 % (01/03 0747) Weight:  [119.5 kg (263 lb 8 oz)] 119.5 kg (263 lb 8 oz) (01/03 0408)  Intake/Output from previous day: 01/02 0701 - 01/03 0700 In: 1320 [P.O.:1320] Out: 1500 [Urine:1500] Intake/Output from this shift: Total I/O In: 240 [P.O.:240] Out: -   Physical Exam: Neck: no adenopathy, no carotid bruit, no JVD and supple, symmetrical, trachea midline Lungs: clear to auscultation bilaterally Heart: regular rate and rhythm, S1, S2 normal and 2/6 systolic murmur noted Abdomen: soft, non-tender; bowel sounds normal; no masses,  no organomegaly Extremities: No clubbing cyanosis left leg edema noted  Lab Results: Recent Labs    08/24/17 0442 08/25/17 0337  WBC 6.5 6.4  HGB 13.5 12.5  PLT 212 210   No results for input(s): NA, K, CL, CO2, GLUCOSE, BUN, CREATININE in the last 72 hours. No results for input(s): TROPONINI in the last 72 hours.  Invalid input(s): CK, MB Hepatic Function Panel No results for input(s): PROT, ALBUMIN, AST, ALT, ALKPHOS, BILITOT, BILIDIR, IBILI in the last 72 hours. No results for input(s): CHOL in the last 72 hours. No results for input(s): PROTIME in the last 72 hours.  Imaging: Imaging results have been reviewed and No results found.  Cardiac Studies:  Assessment/Plan:  Acute right pulmonary embolism Acute left leg DVT Morbid obesity Hypertension Type 2 diabetes mellitus Resolving Acute bronchitis  Plan Continue present management Check labs in a.m.   LOS: 4 days    Charolette Forward 08/25/2017, 9:51 AM

## 2017-08-25 NOTE — Progress Notes (Signed)
ANTICOAGULATION CONSULT NOTE - F/u Consult  Pharmacy Consult for Heparin/Warfarin Indication: DVT/PE  Allergies  Allergen Reactions   Penicillins Rash   Patient Measurements: Height: 5\' 4"  (162.6 cm) Weight: 263 lb 8 oz (119.5 kg) IBW/kg (Calculated) : 54.7 Heparin Dosing Weight: 85 kg  Vital Signs: Temp: 98.6 F (37 C) (01/03 0747) Temp Source: Oral (01/03 0747) BP: 123/77 (01/03 0747) Pulse Rate: 68 (01/03 0747)  Labs: Recent Labs    08/23/17 0302 08/24/17 0442 08/25/17 0337  HGB 13.0 13.5 12.5  HCT 38.8 41.2 38.8  PLT 190 212 210  LABPROT 14.0 15.6* 17.5*  INR 1.09 1.25 1.45  HEPARINUNFRC 0.41 0.36 0.39    Estimated Creatinine Clearance: 85.6 mL/min (by C-G formula based on SCr of 0.77 mg/dL).   Medical History: Past Medical History:  Diagnosis Date   Ankle swelling    bilateral   Anxiety    Diabetes mellitus without complication (HCC)    Hypertension    Leg swelling    bilateral   Plantar fasciitis    left foot   Pre-diabetes    Rosacea    Assessment: 67 year old female admitted to [facility] with new left leg DVT and PE.  CT report shows large R-side PE with right heart strain (RV/LV = 0.96). Pharmacy to dose heparin/warfarin. Started Azithromycin on 08/23/17.  Heparin level is therapeutic at 0.39 on 1400 un/hr. INR is increasing appropriately and remains subtherapeutic at 1.45. CBC wnl and no bleeding noted.    Goal of Therapy:  Heparin level 0.3-0.7 units/ml Monitor platelets by anticoagulation protocol: Yes  INR 2-3   Plan:  Continue heparin gtt at 1400 units/hr until INR therapeutic Repeat warfarin 7.5mg  PO x 1 Daily heparin level, INR, and CBC Monitor for s/s bleeding   Thank you for allowing [place] to participate in this patients care.  Jens Som, PharmD Clinical phone for 08/25/2017 from 7a-3:30p: x [phone] If after 3:30p, please call main pharmacy at: [phone] 08/25/2017 8:27 AM

## 2017-08-25 NOTE — Progress Notes (Signed)
CCMD reported pt had a 3 beat run of VTACH. Pt was found resting in bed, asymptomatic. Will continue to monitor closely. Jacqlyn Larsen, RN

## 2017-08-26 LAB — CBC
HCT: 38.3 % (ref 36.0–46.0)
Hemoglobin: 12.9 g/dL (ref 12.0–15.0)
MCH: 29.9 pg (ref 26.0–34.0)
MCHC: 33.7 g/dL (ref 30.0–36.0)
MCV: 88.9 fL (ref 78.0–100.0)
PLATELETS: 202 10*3/uL (ref 150–400)
RBC: 4.31 MIL/uL (ref 3.87–5.11)
RDW: 14.5 % (ref 11.5–15.5)
WBC: 5.8 10*3/uL (ref 4.0–10.5)

## 2017-08-26 LAB — GLUCOSE, CAPILLARY
GLUCOSE-CAPILLARY: 117 mg/dL — AB (ref 65–99)
Glucose-Capillary: 95 mg/dL (ref 65–99)
Glucose-Capillary: 121 mg/dL — ABNORMAL HIGH (ref 65–99)
Glucose-Capillary: 150 mg/dL — ABNORMAL HIGH (ref 65–99)

## 2017-08-26 LAB — PROTIME-INR
INR: 1.58
Prothrombin Time: 18.7 seconds — ABNORMAL HIGH (ref 11.4–15.2)

## 2017-08-26 LAB — HEPARIN LEVEL (UNFRACTIONATED): Heparin Unfractionated: 0.39 IU/mL (ref 0.30–0.70)

## 2017-08-26 MED ORDER — IPRATROPIUM-ALBUTEROL 0.5-2.5 (3) MG/3ML IN SOLN: 3.0000 mL | Freq: Four times a day (QID) | RESPIRATORY_TRACT | Status: DC | PRN

## 2017-08-26 MED ORDER — WARFARIN SODIUM 10 MG PO TABS
10.0000 mg | ORAL_TABLET | Freq: Once | ORAL | Status: AC
  Administered 2017-08-26: 10 mg via ORAL
  Filled 2017-08-26: qty 1

## 2017-08-26 NOTE — Progress Notes (Signed)
Subjective:  Doing well.  Denies any chest pain or shortness of breath, INR slowly trending up  Objective:  Vital Signs in the last 24 hours: Temp:  [97.9 F (36.6 C)-98.6 F (37 C)] 98 F (36.7 C) (01/04 0742) Pulse Rate:  [63-94] 63 (01/04 0742) Resp:  [18] 18 (01/04 0445) BP: (111-138)/(52-86) 125/74 (01/04 0742) SpO2:  [95 %-100 %] 95 % (01/04 0742) Weight:  [120 kg (264 lb 9.6 oz)] 120 kg (264 lb 9.6 oz) (01/04 0445)  Intake/Output from previous day: 01/03 0701 - 01/04 0700 In: 1200 [P.O.:1200] Out: 750 [Urine:750] Intake/Output from this shift: Total I/O In: 360 [P.O.:360] Out: -   Physical Exam: Neck: no adenopathy, no carotid bruit, no JVD and supple, symmetrical, trachea midline Lungs: clear to auscultation bilaterally Heart: regular rate and rhythm, S1, S2 normal and soft systolic murmur noted Abdomen: soft, non-tender; bowel sounds normal; no masses,  no organomegaly Extremities: extremities normal, atraumatic, no cyanosis or edema  Lab Results: Recent Labs    08/25/17 0337 08/26/17 0358  WBC 6.4 5.8  HGB 12.5 12.9  PLT 210 202   No results for input(s): NA, K, CL, CO2, GLUCOSE, BUN, CREATININE in the last 72 hours. No results for input(s): TROPONINI in the last 72 hours.  Invalid input(s): CK, MB Hepatic Function Panel No results for input(s): PROT, ALBUMIN, AST, ALT, ALKPHOS, BILITOT, BILIDIR, IBILI in the last 72 hours. No results for input(s): CHOL in the last 72 hours. No results for input(s): PROTIME in the last 72 hours.  Imaging: Imaging results have been reviewed and No results found.  Cardiac Studies:  Assessment/Plan:  Acuterightpulmonary embolism Acute left leg DVT Morbid obesity Hypertension Type 2 diabetes mellitus Resolving Acute bronchitis Plan Continue present management. Will DC home once INR above 2   LOS: 5 days    Charolette Forward 08/26/2017, 12:12 PM

## 2017-08-26 NOTE — Progress Notes (Signed)
ANTICOAGULATION CONSULT NOTE - F/u Consult  Pharmacy Consult for Heparin/Warfarin Indication: DVT/PE  Allergies  Allergen Reactions   Penicillins Rash   Patient Measurements: Height: 5\' 4"  (162.6 cm) Weight: 264 lb 9.6 oz (120 kg) IBW/kg (Calculated) : 54.7 Heparin Dosing Weight: 85 kg  Vital Signs: Temp: 97.8 F (36.6 C) (01/04 1216) Temp Source: Oral (01/04 1216) BP: 123/74 (01/04 1216) Pulse Rate: 52 (01/04 1216)  Labs: Recent Labs    08/24/17 0442 08/25/17 0337 08/26/17 0358  HGB 13.5 12.5 12.9  HCT 41.2 38.8 38.3  PLT 212 210 202  LABPROT 15.6* 17.5* 18.7*  INR 1.25 1.45 1.58  HEPARINUNFRC 0.36 0.39 0.39    Estimated Creatinine Clearance: 85.9 mL/min (by C-G formula based on SCr of 0.77 mg/dL).   Medical History: Past Medical History:  Diagnosis Date   Ankle swelling    bilateral   Anxiety    Diabetes mellitus without complication (HCC)    Hypertension    Leg swelling    bilateral   Plantar fasciitis    left foot   Pre-diabetes    Rosacea    Assessment: 67 year old female admitted to [facility] with new left leg DVT and PE.  CT report shows large R-side PE with right heart strain (RV/LV = 0.96). Pharmacy to dose heparin/warfarin. Started Azithromycin on 08/23/17.  Heparin level is therapeutic at 0.39 on 1400 un/hr. INR is increasing slowly and remains subtherapeutic at 1.58. CBC wnl and no bleeding noted.    Goal of Therapy:  Heparin level 0.3-0.7 units/ml Monitor platelets by anticoagulation protocol: Yes  INR 2-3   Plan:  Continue heparin gtt at 1400 units/hr until INR therapeutic Increase warfarin 10mg  PO x 1 Daily heparin level, INR, and CBC Monitor for s/s bleeding  Thank you for allowing [place] to participate in this patients care.  Mariella Saa, PharmD 08/26/2017 1:29 PM

## 2017-08-26 NOTE — Progress Notes (Signed)
°   08/26/17 1500  Clinical Encounter Type  Visited With Patient  Visit Type Initial  Referral From Nurse  Consult/Referral To Chaplain  Spiritual Encounters  Spiritual Needs Prayer  Stress Factors  Patient Stress Factors Exhausted  Family Stress Factors None identified  Mental Health Advance Directives  Does Patient Have a Mental Health Advance Directive? No    Patient was in her bed finishing a phone conversation. Patient talked about her good family dynamics a a great family support system. I provided reflective listening, compassionate presence and prayer.  Stacie Marquez a Medical sales representative, [employer]

## 2017-08-27 LAB — CBC
HCT: 40.4 % (ref 36.0–46.0)
Hemoglobin: 13 g/dL (ref 12.0–15.0)
MCH: 28.2 pg (ref 26.0–34.0)
MCHC: 32.2 g/dL (ref 30.0–36.0)
MCV: 87.6 fL (ref 78.0–100.0)
PLATELETS: 205 10*3/uL (ref 150–400)
RBC: 4.61 MIL/uL (ref 3.87–5.11)
RDW: 14.2 % (ref 11.5–15.5)
WBC: 5.8 10*3/uL (ref 4.0–10.5)

## 2017-08-27 LAB — PROTIME-INR
INR: 1.7
PROTHROMBIN TIME: 19.8 s — AB (ref 11.4–15.2)

## 2017-08-27 LAB — GLUCOSE, CAPILLARY
GLUCOSE-CAPILLARY: 145 mg/dL — AB (ref 65–99)
Glucose-Capillary: 125 mg/dL — ABNORMAL HIGH (ref 65–99)
Glucose-Capillary: 95 mg/dL (ref 65–99)
Glucose-Capillary: 129 mg/dL — ABNORMAL HIGH (ref 65–99)

## 2017-08-27 LAB — HEPARIN LEVEL (UNFRACTIONATED): HEPARIN UNFRACTIONATED: 0.37 [IU]/mL (ref 0.30–0.70)

## 2017-08-27 MED ORDER — WARFARIN SODIUM 10 MG PO TABS
10.0000 mg | ORAL_TABLET | Freq: Once | ORAL | Status: AC
  Administered 2017-08-27: 10 mg via ORAL
  Filled 2017-08-27: qty 1

## 2017-08-27 NOTE — Progress Notes (Signed)
ANTICOAGULATION CONSULT NOTE - F/u Consult  Pharmacy Consult for Heparin/Warfarin Indication: DVT/PE  Allergies  Allergen Reactions   Penicillins Rash   Patient Measurements: Height: 5\' 4"  (162.6 cm) Weight: 261 lb 11.2 oz (118.7 kg) IBW/kg (Calculated) : 54.7 Heparin Dosing Weight: 85 kg  Vital Signs: Temp: 98.4 F (36.9 C) (01/05 0625) Temp Source: Oral (01/05 0625) BP: 133/74 (01/05 0625) Pulse Rate: 73 (01/04 2024)  Labs: Recent Labs    08/25/17 0337 08/26/17 0358 08/27/17 0216  HGB 12.5 12.9 13.0  HCT 38.8 38.3 40.4  PLT 210 202 205  LABPROT 17.5* 18.7* 19.8*  INR 1.45 1.58 1.70  HEPARINUNFRC 0.39 0.39 0.37    Estimated Creatinine Clearance: 85.3 mL/min (by C-G formula based on SCr of 0.77 mg/dL).   Medical History: Past Medical History:  Diagnosis Date   Ankle swelling    bilateral   Anxiety    Diabetes mellitus without complication (HCC)    Hypertension    Leg swelling    bilateral   Plantar fasciitis    left foot   Pre-diabetes    Rosacea    Assessment: 67 year old female admitted to [facility] with new left leg DVT and PE.  CT report shows large R-side PE with right heart strain (RV/LV = 0.96). Pharmacy to dose heparin/warfarin. Started Azithromycin on 08/23/17.  Heparin level is therapeutic at 0.37 on 1400 un/hr. INR is increasing slowly and remains subtherapeutic at 1.70. CBC wnl and no bleeding noted.    Goal of Therapy:  Heparin level 0.3-0.7 units/ml Monitor platelets by anticoagulation protocol: Yes  INR 2-3   Plan:  Continue heparin gtt at 1400 units/hr until INR therapeutic Warfarin 10mg  PO x 1 Daily heparin level, INR, and CBC Monitor for s/s bleeding  Leroy Libman, PharmD Pharmacy Resident Pager: [phone]

## 2017-08-27 NOTE — Progress Notes (Signed)
Subjective:  And doing well denies any chest pain or shortness of breath. Tolerating heparin and Coumadin. INR gradually trending upwards therapeutic range.  Objective:  Vital Signs in the last 24 hours: Temp:  [97.8 F (36.6 C)-98.4 F (36.9 C)] 98.4 F (36.9 C) (01/05 0625) Pulse Rate:  [52-76] 73 (01/04 2024) Resp:  [16-18] 16 (01/05 0625) BP: (119-137)/(62-86) 131/86 (01/05 1021) SpO2:  [96 %-99 %] 97 % (01/05 0625) Weight:  [118.7 kg (261 lb 11.2 oz)] 118.7 kg (261 lb 11.2 oz) (01/05 0600)  Intake/Output from previous day: 01/04 0701 - 01/05 0700 In: 998 [P.O.:840; I.V.:158] Out: 900 [Urine:900] Intake/Output from this shift: No intake/output data recorded.  Physical Exam: Neck: no adenopathy, no carotid bruit, no JVD and supple, symmetrical, trachea midline Lungs: clear to auscultation bilaterally Heart: regular rate and rhythm, S1, S2 normal and Soft systolic murmur noted Abdomen: soft, non-tender; bowel sounds normal; no masses,  no organomegaly Extremities: extremities normal, atraumatic, no cyanosis or edema  Lab Results: Recent Labs    08/26/17 0358 08/27/17 0216  WBC 5.8 5.8  HGB 12.9 13.0  PLT 202 205   No results for input(s): NA, K, CL, CO2, GLUCOSE, BUN, CREATININE in the last 72 hours. No results for input(s): TROPONINI in the last 72 hours.  Invalid input(s): CK, MB Hepatic Function Panel No results for input(s): PROT, ALBUMIN, AST, ALT, ALKPHOS, BILITOT, BILIDIR, IBILI in the last 72 hours. No results for input(s): CHOL in the last 72 hours. No results for input(s): PROTIME in the last 72 hours.  Imaging: Imaging results have been reviewed and No results found.  Cardiac Studies:  Assessment/Plan:  Acuterightpulmonary embolism Acute left leg DVT Morbid obesity Hypertension Type 2 diabetes mellitus ResolvingAcute bronchitis Plan Continue present management Possible discharge tomorrow once INR above 2   LOS: 6 days    Charolette Forward 08/27/2017, 11:05 AM

## 2017-08-28 LAB — CBC
HCT: 39.8 % (ref 36.0–46.0)
Hemoglobin: 12.9 g/dL (ref 12.0–15.0)
MCH: 28.5 pg (ref 26.0–34.0)
MCHC: 32.4 g/dL (ref 30.0–36.0)
MCV: 88.1 fL (ref 78.0–100.0)
PLATELETS: 218 10*3/uL (ref 150–400)
RBC: 4.52 MIL/uL (ref 3.87–5.11)
RDW: 14.3 % (ref 11.5–15.5)
WBC: 6.2 10*3/uL (ref 4.0–10.5)

## 2017-08-28 LAB — GLUCOSE, CAPILLARY
GLUCOSE-CAPILLARY: 109 mg/dL — AB (ref 65–99)
Glucose-Capillary: 114 mg/dL — ABNORMAL HIGH (ref 65–99)
Glucose-Capillary: 124 mg/dL — ABNORMAL HIGH (ref 65–99)

## 2017-08-28 LAB — PROTIME-INR
INR: 1.93
PROTHROMBIN TIME: 21.9 s — AB (ref 11.4–15.2)

## 2017-08-28 LAB — HEPARIN LEVEL (UNFRACTIONATED): Heparin Unfractionated: 0.51 IU/mL (ref 0.30–0.70)

## 2017-08-28 MED ORDER — WARFARIN SODIUM 10 MG PO TABS
10.0000 mg | ORAL_TABLET | Freq: Once | ORAL | Status: AC
  Administered 2017-08-28: 10 mg via ORAL
  Filled 2017-08-28: qty 1

## 2017-08-28 NOTE — Plan of Care (Signed)
°  Progressing Clinical Measurements: Ability to maintain clinical measurements within normal limits will improve 08/28/2017 2229 - Progressing by Colonel Bald, RN Note VSS.  No distress noted.  INR trending toward goal.

## 2017-08-28 NOTE — Progress Notes (Signed)
Subjective:  Doing well denies any chest pain or shortness of breath. INR slowly trending up.  Objective:  Vital Signs in the last 24 hours: Temp:  [97.6 F (36.4 C)-97.7 F (36.5 C)] 97.7 F (36.5 C) (01/06 0451) Pulse Rate:  [63-82] 82 (01/06 0451) Resp:  [14-18] 18 (01/06 0451) BP: (111-155)/(63-68) 111/68 (01/06 0830) SpO2:  [96 %-99 %] 99 % (01/06 0451) Weight:  [119 kg (262 lb 6.4 oz)] 119 kg (262 lb 6.4 oz) (01/06 0451)  Intake/Output from previous day: 01/05 0701 - 01/06 0700 In: 1374 [P.O.:1070; I.V.:304] Out: 1800 [Urine:1800] Intake/Output from this shift: Total I/O In: 168.5 [P.O.:120; I.V.:48.5] Out: -   Physical Exam: exam unchanged  Lab Results: Recent Labs    08/27/17 0216 08/28/17 0309  WBC 5.8 6.2  HGB 13.0 12.9  PLT 205 218   No results for input(s): NA, K, CL, CO2, GLUCOSE, BUN, CREATININE in the last 72 hours. No results for input(s): TROPONINI in the last 72 hours.  Invalid input(s): CK, MB Hepatic Function Panel No results for input(s): PROT, ALBUMIN, AST, ALT, ALKPHOS, BILITOT, BILIDIR, IBILI in the last 72 hours. No results for input(s): CHOL in the last 72 hours. No results for input(s): PROTIME in the last 72 hours.  Imaging: Imaging results have been reviewed and No results found.  Cardiac Studies:  Assessment/Plan:  Acuterightpulmonary embolism Acute left leg DVT Morbid obesity Hypertension Type 2 diabetes mellitus Status postAcute bronchitis Plan Continue IV heparin and Coumadin 10 mg by mouth today Will DC home tomorrow and decide the home dose of Coumadin tomorrow depending on INR results.   LOS: 7 days    Rinaldo Cloud 08/28/2017, 11:26 AM

## 2017-08-28 NOTE — Progress Notes (Signed)
ANTICOAGULATION CONSULT NOTE - F/u Consult  Pharmacy Consult for Heparin/Warfarin Indication: DVT/PE  Allergies  Allergen Reactions   Penicillins Rash   Patient Measurements: Height: 5\' 4"  (162.6 cm) Weight: 262 lb 6.4 oz (119 kg) IBW/kg (Calculated) : 54.7 Heparin Dosing Weight: 85 kg  Vital Signs: Temp: 97.7 F (36.5 C) (01/06 0451) Temp Source: Oral (01/06 0451) BP: 155/64 (01/06 0451) Pulse Rate: 82 (01/06 0451)  Labs: Recent Labs    08/26/17 0358 08/27/17 0216 08/28/17 0309  HGB 12.9 13.0 12.9  HCT 38.3 40.4 39.8  PLT 202 205 218  LABPROT 18.7* 19.8* 21.9*  INR 1.58 1.70 1.93  HEPARINUNFRC 0.39 0.37 0.51    Estimated Creatinine Clearance: 85.4 mL/min (by C-G formula based on SCr of 0.77 mg/dL).   Medical History: Past Medical History:  Diagnosis Date   Ankle swelling    bilateral   Anxiety    Diabetes mellitus without complication (HCC)    Hypertension    Leg swelling    bilateral   Plantar fasciitis    left foot   Pre-diabetes    Rosacea    Assessment: 67 year old female admitted to [facility] with new left leg DVT and PE.  CT report shows large R-side PE with right heart strain (RV/LV = 0.96). Pharmacy to dose heparin/warfarin. Started Azithromycin on 08/23/17.  Heparin level is therapeutic at 0.51 on 1400 unit/hr. INR is increasing slowly and remains subtherapeutic at 1.93. CBC wnl and no bleeding noted.    Goal of Therapy:  Heparin level 0.3-0.7 units/ml Monitor platelets by anticoagulation protocol: Yes  INR 2-3   Plan:  Continue heparin gtt at 1400 units/hr until INR therapeutic Warfarin 10mg  PO x 1 Daily heparin level, INR, and CBC Monitor for s/s bleeding  Leroy Libman, PharmD Pharmacy Resident Pager: [phone]

## 2017-08-29 LAB — HEPARIN LEVEL (UNFRACTIONATED): HEPARIN UNFRACTIONATED: 0.51 [IU]/mL (ref 0.30–0.70)

## 2017-08-29 LAB — CBC
HEMATOCRIT: 39.5 % (ref 36.0–46.0)
Hemoglobin: 13 g/dL (ref 12.0–15.0)
MCH: 29 pg (ref 26.0–34.0)
MCHC: 32.9 g/dL (ref 30.0–36.0)
MCV: 88.2 fL (ref 78.0–100.0)
Platelets: 207 10*3/uL (ref 150–400)
RBC: 4.48 MIL/uL (ref 3.87–5.11)
RDW: 14.1 % (ref 11.5–15.5)
WBC: 6.2 10*3/uL (ref 4.0–10.5)

## 2017-08-29 LAB — PROTIME-INR
INR: 2.22
Prothrombin Time: 24.4 seconds — ABNORMAL HIGH (ref 11.4–15.2)

## 2017-08-29 LAB — GLUCOSE, CAPILLARY
Glucose-Capillary: 115 mg/dL — ABNORMAL HIGH (ref 65–99)
Glucose-Capillary: 115 mg/dL — ABNORMAL HIGH (ref 65–99)

## 2017-08-29 MED ORDER — WARFARIN SODIUM 10 MG PO TABS
10.0000 mg | ORAL_TABLET | Freq: Once | ORAL | Status: AC
  Administered 2017-08-29: 10 mg via ORAL
  Filled 2017-08-29: qty 1

## 2017-08-29 MED ORDER — WARFARIN SODIUM 10 MG PO TABS: 10.0000 mg | ORAL_TABLET | Freq: Every day | ORAL | 3 refills | Status: AC

## 2017-08-29 NOTE — Discharge Summary (Signed)
NAMELAILEE, HOELZEL NO.:  [id number]  MEDICAL RECORD NO.:  [id number]  LOCATION:                                 FACILITY:  PHYSICIAN:  Jordin Vicencio N. Terrence Dupont, M.D.      DATE OF BIRTH:  DATE OF ADMISSION:  08/21/2017 DATE OF DISCHARGE:  08/29/2017                              DISCHARGE SUMMARY   ADMITTING DIAGNOSES: 1. Acute right pulmonary embolism. 2. Acute left leg deep vein thrombosis. 3. Morbid obesity. 4. Hypertension. 5. Type 2 diabetes mellitus.  DISCHARGE DIAGNOSES: 1. Status post acute right pulmonary embolism. 2. Status post left leg deep vein thrombosis. 3. Morbid obesity. 4. Hypertension. 5. Type 2 diabetes mellitus. 6. Status post acute bronchitis.  DISCHARGE MEDICATIONS: 1. Warfarin 10 mg 1 tablet daily at 6 p.m. 2. Tylenol 500 mg every 6 hours as needed for pain. 3. BuSpar 10 mg twice daily. 4. Lasix 40 mg as needed. 5. Losartan 50 mg 1 tablet daily. 6. Metformin 500 mg daily. 7. Nitrostat sublingual p.r.n. 8. The patient has been advised to stop aspirin, cyclobenzaprine, and     ibuprofen.  DIET:  Low-salt, low-cholesterol, 1800 calories, ADA weight-reducing diet.  The patient has been advised to get PT/INR by the end of this week.  FOLLOWUP:  Follow up with me in 1 week.  CONDITION AT DISCHARGE:  Stable.  BRIEF HISTORY AND HOSPITAL COURSE:  Ms. Stoneberg is a 67 year old female with a 2-week history of fatigue and exertional dyspnea.  Denies any chest pain, fever, or cough.  She also has noticed left leg swelling and was told to have arthritis as she had left knee pain and swelling also. Her left leg venous duplex was positive for DVT and subsequently had CT of the chest which is positive for right pulmonary embolism with right heart strain.  The patient was admitted by Dr. Doylene Canard on August 21, 2017.  Past.  PHYSICAL EXAMINATION:  GENERAL:  She was alert, awake, oriented x3. VITAL SIGNS:  Blood pressure was 150/88, pulse 84.   She was afebrile. HEENT:  Conjunctivae were pink. NECK:  Supple.  No JVD.  No bruit. LUNGS:  Clear to auscultation bilaterally. CARDIOVASCULAR:  S1, S2 were normal.  There was 2/6 systolic murmur.  No click, rub, or gallop. ABDOMEN:  Soft.  Bowel sounds present.  Nontender. EXTREMITIES:  Left lower leg and knee joint swelling.  No cyanosis or clubbing. NEURO:  Grossly intact.  LABORATORY DATA:  Sodium was 141, potassium 3.7, glucose 120, BUN 12, and creatinine 0.94.  Hemoglobin was 14.6, hematocrit 42.3, white count of 8.2.  Her D-dimer was strongly positive at 9.84.  CT of the chest suggested large right-sided pulmonary emboli burden positive for acute PE with CT evidence of right heart strain, consistent with at least submassive PE.  Chest x-ray showed mild enlargement of cardiac silhouette.  No overt pulmonary edema.  No active pulmonary disease. The patient had 2D echo done, which showed good LV systolic function. EF 55-60% with grade 1 diastolic dysfunction.  Right ventricular cavity was mildly dilated.  Systolic function was normal.  There was trivial tricuspid regurgitation.  BRIEF HOSPITAL COURSE:  The patient was  admitted to telemetry unit.  The patient was started on IV heparin and Coumadin with improvement in her symptoms with resolution of her leg swelling and normalization of her breathing.  The patient's INR has gradually trended up over last few days and today's INR is 2.2.  The patient had episode of coughing and sore throat, was started on Z-Pak with resolution of above symptoms. The patient is ambulating in room without any problems.  Her O2 sats have remained normal during the hospital stay.  The patient had no episodes of hypotension during the hospital stay.  The patient will be discharged home on above medications and will be followed up closely in my office.     Allegra Lai. Terrence Dupont, M.D.     MNH/MEDQ  D:  08/29/2017  T:  08/29/2017  Job:  [id number]

## 2017-08-29 NOTE — Discharge Summary (Signed)
Discharge summary dictated on 08/29/2017, dictation number is [phone]

## 2017-08-29 NOTE — Progress Notes (Signed)
Discharge summary reviewed with pt by charge RN. Pt expresses no concerns at this time. No change since previous assessment. PIV removed. Pt discharged to lobby with daughter via wheelchair and RN.

## 2017-08-29 NOTE — Progress Notes (Signed)
ANTICOAGULATION CONSULT NOTE - Follow-Up Consult  Pharmacy Consult for Heparin >> Warfarin Indication: DVT/PE  Allergies  Allergen Reactions   Penicillins Rash   Patient Measurements: Height: 5\' 4"  (162.6 cm) Weight: 261 lb 12.8 oz (118.8 kg) IBW/kg (Calculated) : 54.7 Heparin Dosing Weight: 85 kg  Vital Signs: Temp: 98 F (36.7 C) (01/07 0500) Temp Source: Oral (01/07 0500) BP: 138/73 (01/07 0500) Pulse Rate: 72 (01/07 0500)  Labs: Recent Labs    08/27/17 0216 08/28/17 0309 08/29/17 0458  HGB 13.0 12.9 13.0  HCT 40.4 39.8 39.5  PLT 205 218 207  LABPROT 19.8* 21.9* 24.4*  INR 1.70 1.93 2.22  HEPARINUNFRC 0.37 0.51 0.51    Estimated Creatinine Clearance: 85.3 mL/min (by C-G formula based on SCr of 0.77 mg/dL).   Medical History: Past Medical History:  Diagnosis Date   Ankle swelling    bilateral   Anxiety    Diabetes mellitus without complication (HCC)    Hypertension    Leg swelling    bilateral   Plantar fasciitis    left foot   Pre-diabetes    Rosacea    Assessment: 67 year old female admitted to [facility] with new left leg DVT and PE.  CT report shows large R-side PE with right heart strain (RV/LV = 0.96). Pharmacy to dose heparin/warfarin. Started Azithromycin on 08/23/17 >> 08/27/17.  Heparin level remains therapeutic on 1400 unit/hr. INR is therapeutic at 2.22.   CBC wnl and no bleeding noted.    Goal of Therapy:  Heparin level 0.3-0.7 units/ml Monitor platelets by anticoagulation protocol: Yes  INR 2-3   Plan:  Discontinue heparin and associated labs. Warfarin 10mg  PO x 1 today. Suggest Warfarin 7.5mg  daily except 10mg  MWF on discharge. Suggest outpatient INR check Friday 1/11, if possible. Warfarin education completed.  [employer], Pharm.D., BCPS Clinical Pharmacist Pager: [phone] Clinical phone for 08/29/2017 from 8:30-4:00 is [phone]. After 4pm, please call Main Rx [phone]) for assistance. 08/29/2017 10:49 AM

## 2017-08-29 NOTE — Discharge Instructions (Signed)
Deep Vein Thrombosis Deep vein thrombosis (DVT) is a condition in which a blood clot forms in a deep vein, such as a lower leg, thigh, or arm vein. A clot is blood that has thickened into a gel or solid. This condition is dangerous. It can lead to serious and even life-threatening complications if the clot travels to the lungs and causes a blockage (pulmonary embolism). It can also damage veins in the leg. This can result in leg pain, swelling, discoloration, and sores (post-thrombotic syndrome). What are the causes? This condition may be caused by:  A slowdown of blood flow.  Damage to a vein.  A condition that makes blood clot more easily.  What increases the risk? The following factors may make you more likely to develop this condition:  Being overweight.  Being elderly, especially over age 57.  Sitting or lying down for more than four hours.  Lack of physical activity (sedentary lifestyle).  Being pregnant, giving birth, or having recently given birth.  Taking medicines that contain estrogen.  Smoking.  A history of any of the following: ? Blood clots or blood clotting disease. ? Peripheral vascular disease. ? Inflammatory bowel disease. ? Cancer. ? Heart disease. ? Genetic conditions that affect how blood clots. ? Neurological diseases that affect the legs (leg paresis). ? Injury. ? Major or lengthy surgery. ? A central line placed inside a large vein.  What are the signs or symptoms? Symptoms of this condition include:  Swelling, pain, or tenderness in an arm or leg.  Warmth, redness, or discoloration in an arm or leg.  If the clot is in your leg, symptoms may be more noticeable or worse when you stand or walk. Some people do not have any symptoms. How is this diagnosed? This condition is diagnosed with:  A medical history.  A physical exam.  Tests, such as: ? Blood tests. These are done to see how your blood clots. ? Imaging tests. These are done to  check for clots. Tests may include:  Ultrasound.  CT scan.  MRI.  X-ray.  Venogram. For this test, X-rays are taken after a dye is injected into a vein.  How is this treated? Treatment for this condition depends on the cause, your risk for bleeding or developing more clots, and any medical conditions you have. Treatment may include:  Taking blood thinners (also called anticoagulants). These medicines may be taken by mouth, injected under the skin, or injected through an IV tube (catheter). These medicines prevent clots from forming.  Injecting medicine that dissolves blood clots into the affected vein (catheter-directed thrombolysis).  Having surgery. Surgery may be done to: ? Remove the clot. ? Place a filter in a large vein to catch blood clots before they reach the lungs.  Some treatments may be continued for up to six months. Follow these instructions at home: If you are taking an oral blood thinner:  Take the medicine exactly as told by your health care provider. Some blood thinners need to be taken at the same time every day. Do not skip a dose.  Ask your health care provider about what foods and drugs interact with the medicine.  Ask about possible side effects. General instructions  Blood thinners can cause easy bruising and difficulty stopping bleeding. Because of this, if you are taking or were given a blood thinner: ? Hold pressure over cuts for longer than usual. ? Tell your dentist and other health care providers that you are taking blood thinners  before having any procedures that can cause bleeding. ? Avoid contact sports.  Take over-the-counter and prescription medicines only as told by your health care provider.  Return to your normal activities as told by your health care provider. Ask your health care provider what activities are safe for you.  Wear compression stockings if recommended by your health care provider.  Keep all follow-up visits as told by  your health care provider. This is important. How is this prevented? To lower your risk of developing this condition again:  For 30 or more minutes every day, do an activity that: ? Involves moving your arms and legs. ? Increases your heart rate.  When traveling for longer than four hours: ? Exercise your arms and legs every hour. ? Drink plenty of water. ? Avoid drinking alcohol.  Avoid sitting or lying for a long time without moving your legs.  Stay a healthy weight.  If you are a woman who is older than age 8, avoid unnecessary use of medicines that contain estrogen.  Do not use any products that contain nicotine or tobacco, such as cigarettes and e-cigarettes. This is especially important if you take estrogen medicines. If you need help quitting, ask your health care provider.  Contact a health care provider if:  You miss a dose of your blood thinner.  You have nausea, vomiting, or diarrhea that lasts for more than one day.  Your menstrual period is heavier than usual.  You have unusual bruising. Get help right away if:  You have new or increased pain, swelling, or redness in an arm or leg.  You have numbness or tingling in an arm or leg.  You have shortness of breath.  You have chest pain.  You have a rapid or irregular heartbeat.  You feel light-headed or dizzy.  You cough up blood.  There is blood in your vomit, stool, or urine.  You have a serious fall or accident, or you hit your head.  You have a severe headache or confusion.  You have a cut that will not stop bleeding. These symptoms may represent a serious problem that is an emergency. Do not wait to see if the symptoms will go away. Get medical help right away. Call your local emergency services (911 in the [place] Do not drive yourself to the hospital. Summary  DVT is a condition in which a blood clot forms in a deep vein, such as a lower leg, thigh, or arm vein.  Symptoms can include swelling,  warmth, pain, and redness in your leg or arm.  Treatment may include taking blood thinners, injecting medicine that dissolves blood clots,wearing compression stockings, or surgery.  If you are prescribed blood thinners, take them exactly as told. This information is not intended to replace advice given to you by your health care provider. Make sure you discuss any questions you have with your health care provider. Document Released: 08/09/2005 Document Revised: 09/11/2016 Document Reviewed: 09/11/2016 Elsevier Interactive Patient Education  2018 [employer].  Pulmonary Embolism A pulmonary embolism (PE) is a sudden blockage or decrease of blood flow in one lung or both lungs. Most blockages come from a blood clot that forms in a lower leg, thigh, or arm vein (deep vein thrombosis, DVT) and travels to the lungs. A clot is blood that has thickened into a gel or solid. PE is a dangerous and life-threatening condition that needs to be treated right away. What are the causes? This condition is usually caused by a  blood clot that forms in a vein and moves to the lungs. In rare cases, it may be caused by air, fat, part of a tumor, or other tissue that moves through the veins and into the lungs. What increases the risk? The following factors may make you more likely to develop this condition:  Having DVT or a history of DVT.  Being older than age 73.  Personal or family history of blood clots or blood clotting disease.  Major or lengthy surgery.  Orthopedic surgery, especially hip or knee replacement.  Traumatic injury, such as breaking a hip or leg.  Spinal cord injury.  Stroke.  Taking medicines that contain estrogen. These include birth control pills and hormone replacement therapy.  Long-term (chronic) lung or heart disease.  Cancer and chemotherapy.  Having a central venous catheter.  Pregnancy and the period after delivery.  What are the signs or symptoms? Symptoms of this  condition usually start suddenly and include:  Shortness of breath while active or at rest.  Coughing or coughing up blood or blood-tinged mucus.  Chest pain that is often worse with deep breaths.  Rapid or irregular heartbeat.  Feeling light-headed or dizzy.  Fainting.  Feeling anxious.  Sweating.  Pain and swelling in a leg. This is a symptom of DVT, which can lead to PE.  How is this diagnosed? This condition may be diagnosed based on:  Your medical history.  A physical exam.  Blood tests to check blood oxygen level and how well your blood clots, and a D-dimer blood test, which checks your blood for a substance that is released when a blood clot breaks apart.  CT pulmonary angiogram. This test checks blood flow in and around your lungs.  Ventilation-perfusion scan, also called a lung VQ scan. This test measures air flow and blood flow to the lungs.  Ultrasound of the legs to look for blood clots.  How is this treated? Treatment for this conditions depends on many factors, such as the cause of your PE, your risk for bleeding or developing more clots, and other medical conditions you have. Treatment aims to remove, dissolve, or stop blood clots from forming or growing larger. Treatment may include:  Blood thinning medicines (anticoagulants) to stop clots from forming or growing. These medicines may be given as a pill, as an injection, or through an IV tube (infusion).  Medicines that dissolve clots (thrombolytics).  A procedure in which a flexible tube is used to remove a blood clot (embolectomy) or deliver medicine to destroy it (catheter-directed thrombolysis).  A procedure in which a filter is inserted into a large vein that carries blood to the heart (inferior vena cava). This filter (vena cava filter) catches blood clots before they reach the lungs.  Surgery to remove the clot (surgical embolectomy). This is rare.  You may need a combination of immediate,  long-term (up to 3 months after diagnosis), and extended (more than 3 months after diagnosis) treatments. Your treatment may continue for several months (maintenance therapy). You and your health care provider will work together to choose the treatment program that is best for you. Follow these instructions at home: If you are taking an anticoagulant medicine:  Take the medicine every day at the same time each day.  Understand what foods and drugs interact with your medicine.  Understand the side effects of this medicine, including excessive bruising or bleeding. Ask your health care provider or pharmacist about other side effects. General instructions  Take over-the-counter and  prescription medicines only as told by your health care provider.  Anticoagulant medicines may cause side effects, including easy bruising and difficulty stopping bleeding. If you are prescribed an anticoagulant: ? Hold pressure over cuts for longer than usual. ? Tell your dentist and other health care providers that you are taking anticoagulants before you have any procedure that may cause bleeding. ? Avoid contact sports. ? Be extra careful when handling sharp objects. ? Use a soft toothbrush. Floss with waxed dental floss. ? Shave with an Copy.  Wear a medical alert bracelet or carry a medical alert card that says you have had a PE.  Ask your health care provider when you may return to your normal activities.  Talk with your health care provider about any travel plans. It is important to make sure that you are still able to take your medicine while on trips.  Keep all follow-up visits as told by your health care provider. This is important. How is this prevented? Take these actions to lower your risk of developing another PE:  Exercise regularly. Take frequent walks. For at least 30 minutes every day, engage in: ? Activity that involves moving your arms and legs. ? Activity that encourages good  blood flow through your body by increasing your heart rate.  While traveling, drink plenty of water and avoid drinking alcohol. Ask your health care provider if you should wear below-the-knee compression stockings.  Avoid sitting or lying in bed for long periods of time without moving your legs. Exercise your arms and legs every hour during long-distance travel (over 4 hours).  If you are hospitalized or have surgery, ask your health care provider about your risks and what treatments can help prevent blood clots.  Maintain a healthy weight. Ask your health care provider what weight is healthy for you.  If you are a woman who is over age 64, avoid unnecessary use of medicines that contain estrogen, including birth control pills.  Do not use any products that contain nicotine or tobacco, such as cigarettes and e-cigarettes. This is especially important if you take estrogen medicines. If you need help quitting, ask your health care provider.  See your health care provider for regular checkups. This may include blood tests and ultrasound testing on your legs to check for new blood clots.  Contact a health care provider if:  You missed a dose of your blood thinner medicine. Get help right away if:  You have new or increased pain, swelling, warmth, or redness in an arm or leg.  You have numbness or tingling in an arm or leg.  You have shortness of breath while active or at rest.  You have chest pain.  You have a rapid or irregular heartbeat.  You feel light-headed or dizzy.  You cough up blood.  You have blood in your vomit, stool, or urine.  You have a fever.  You have abdomen (abdominal) pain.  You have a severe fall or head injury.  You have a severe headache.  You have vision changes.  You cannot move your arms or legs.  You are confused or have memory loss.  You are bleeding for 10 minutes or more, even with strong pressure on the wound. These symptoms may represent  a serious problem that is an emergency. Do not wait to see if the symptoms will go away. Get medical help right away. Call your local emergency services (911 in the [place] Do not drive yourself to the hospital. Summary  A pulmonary embolism (PE) is a sudden blockage or decrease of blood flow in one lung or both lungs. PE is a dangerous and life-threatening condition that needs to be treated right away.  Having deep vein thrombosis (DVT) or a history of DVT is the most common risk factor for PE.  Treatments for this condition usually include medicines to thin your blood (anticoagulants) or medicines to break apart blood clots (thrombolytics).  If you are prescribed blood thinners, it is important to take the medicine every single day at the same time each day.  If you have signs of PE or DVT, call your local emergency services (911 in the [place] This information is not intended to replace advice given to you by your health care provider. Make sure you discuss any questions you have with your health care provider. Document Released: 08/06/2000 Document Revised: 09/11/2016 Document Reviewed: 09/11/2016 Elsevier Interactive Patient Education  2018 [place] on my medicine - Coumadin   (Warfarin)  This medication education was reviewed with me or my healthcare representative as part of my discharge preparation.  The pharmacist that spoke with me during my hospital stay was:   Why was Coumadin prescribed for you? Coumadin was prescribed for you because you have a blood clot or a medical condition that can cause an increased risk of forming blood clots. Blood clots can cause serious health problems by blocking the flow of blood to the heart, lung, or brain. Coumadin can prevent harmful blood clots from forming. As a reminder your indication for Coumadin is:   Pulmonary Embolism Treatment  What test will check on my response to Coumadin? While on Coumadin (warfarin) you will need to  have an INR test regularly to ensure that your dose is keeping you in the desired range. The INR (international normalized ratio) number is calculated from the result of the laboratory test called prothrombin time (PT).  If an INR APPOINTMENT HAS NOT ALREADY BEEN MADE FOR YOU please schedule an appointment to have this lab work done by your health care provider within 7 days. Your INR goal is usually a number between:  2 to 3 or your provider may give you a more narrow range like 2-2.5.  Ask your health care provider during an office visit what your goal INR is.  What  do you need to  know  About  COUMADIN? Take Coumadin (warfarin) exactly as prescribed by your healthcare provider about the same time each day.  DO NOT stop taking without talking to the doctor who prescribed the medication.  Stopping without other blood clot prevention medication to take the place of Coumadin may increase your risk of developing a new clot or stroke.  Get refills before you run out.  What do you do if you miss a dose? If you miss a dose, take it as soon as you remember on the same day then continue your regularly scheduled regimen the next day.  Do not take two doses of Coumadin at the same time.  Important Safety Information A possible side effect of Coumadin (Warfarin) is an increased risk of bleeding. You should call your healthcare provider right away if you experience any of the following: ? Bleeding from an injury or your nose that does not stop. ? Unusual colored urine (red or dark brown) or unusual colored stools (red or black). ? Unusual bruising for unknown reasons. ? A serious fall or if you hit your head (even if there is no bleeding).  Some foods or medicines interact with Coumadin (warfarin) and might alter your response to warfarin. To help avoid this: ? Eat a balanced diet, maintaining a consistent amount of Vitamin K. ? Notify your provider about major diet changes you plan to make. ? Avoid  alcohol or limit your intake to 1 drink for women and 2 drinks for men per day. (1 drink is 5 oz. wine, 12 oz. beer, or 1.5 oz. liquor.)  Make sure that ANY health care provider who prescribes medication for you knows that you are taking Coumadin (warfarin).  Also make sure the healthcare provider who is monitoring your Coumadin knows when you have started a new medication including herbals and non-prescription products.  Coumadin (Warfarin)  Major Drug Interactions  Increased Warfarin Effect Decreased Warfarin Effect  Alcohol (large quantities) Antibiotics (esp. Septra/Bactrim, Flagyl, Cipro) Amiodarone (Cordarone) Aspirin (ASA) Cimetidine (Tagamet) Megestrol (Megace) NSAIDs (ibuprofen, naproxen, etc.) Piroxicam (Feldene) Propafenone (Rythmol SR) Propranolol (Inderal) Isoniazid (INH) Posaconazole (Noxafil) Barbiturates (Phenobarbital) Carbamazepine (Tegretol) Chlordiazepoxide (Librium) Cholestyramine (Questran) Griseofulvin Oral Contraceptives Rifampin Sucralfate (Carafate) Vitamin K   Coumadin (Warfarin) Major Herbal Interactions  Increased Warfarin Effect Decreased Warfarin Effect  Garlic Ginseng Ginkgo biloba Coenzyme Q10 Green tea St. Johns wort    Coumadin (Warfarin) FOOD Interactions  Eat a consistent number of servings per week of foods HIGH in Vitamin K (1 serving =  cup)  Collards (cooked, or boiled & drained) Kale (cooked, or boiled & drained) Mustard greens (cooked, or boiled & drained) Parsley *serving size only =  cup Spinach (cooked, or boiled & drained) Swiss chard (cooked, or boiled & drained) Turnip greens (cooked, or boiled & drained)  Eat a consistent number of servings per week of foods MEDIUM-HIGH in Vitamin K (1 serving = 1 cup)  Asparagus (cooked, or boiled & drained) Broccoli (cooked, boiled & drained, or raw & chopped) Brussel sprouts (cooked, or boiled & drained) *serving size only =  cup Lettuce, raw (green leaf, endive,  romaine) Spinach, raw Turnip greens, raw & chopped   These websites have more information on Coumadin (warfarin):  [employer]; [employer]anReport.com.au;

## 2018-02-24 ENCOUNTER — Other Ambulatory Visit: Payer: Self-pay

## 2018-02-24 ENCOUNTER — Emergency Department (HOSPITAL_COMMUNITY)
Admission: EM | Admit: 2018-02-24 | Discharge: 2018-02-24 | Disposition: A | Discharge status: Home / Self Care | Payer: BC Managed Care – PPO | Attending: Emergency Medicine | Admitting: Emergency Medicine

## 2018-02-24 DIAGNOSIS — N3000 Acute cystitis without hematuria: Secondary | ICD-10-CM | POA: Diagnosis not present

## 2018-02-24 DIAGNOSIS — Z79899 Other long term (current) drug therapy: Secondary | ICD-10-CM | POA: Insufficient documentation

## 2018-02-24 DIAGNOSIS — Z9049 Acquired absence of other specified parts of digestive tract: Secondary | ICD-10-CM | POA: Diagnosis not present

## 2018-02-24 DIAGNOSIS — E119 Type 2 diabetes mellitus without complications: Secondary | ICD-10-CM | POA: Diagnosis not present

## 2018-02-24 DIAGNOSIS — R531 Weakness: Secondary | ICD-10-CM | POA: Diagnosis present

## 2018-02-24 DIAGNOSIS — Z7984 Long term (current) use of oral hypoglycemic drugs: Secondary | ICD-10-CM | POA: Diagnosis not present

## 2018-02-24 DIAGNOSIS — I10 Essential (primary) hypertension: Secondary | ICD-10-CM | POA: Insufficient documentation

## 2018-02-24 DIAGNOSIS — F419 Anxiety disorder, unspecified: Secondary | ICD-10-CM | POA: Insufficient documentation

## 2018-02-24 DIAGNOSIS — Z7901 Long term (current) use of anticoagulants: Secondary | ICD-10-CM | POA: Insufficient documentation

## 2018-02-24 LAB — BASIC METABOLIC PANEL
ANION GAP: 9 (ref 5–15)
BUN: 17 mg/dL (ref 8–23)
CALCIUM: 9.1 mg/dL (ref 8.9–10.3)
CO2: 26 mmol/L (ref 22–32)
CREATININE: 0.91 mg/dL (ref 0.44–1.00)
Chloride: 106 mmol/L (ref 98–111)
Glucose, Bld: 156 mg/dL — ABNORMAL HIGH (ref 70–99)
Potassium: 4.1 mmol/L (ref 3.5–5.1)
SODIUM: 141 mmol/L (ref 135–145)

## 2018-02-24 LAB — CBC
HCT: 45.8 % (ref 36.0–46.0)
Hemoglobin: 14.6 g/dL (ref 12.0–15.0)
MCH: 28.6 pg (ref 26.0–34.0)
MCHC: 31.9 g/dL (ref 30.0–36.0)
MCV: 89.8 fL (ref 78.0–100.0)
PLATELETS: 205 10*3/uL (ref 150–400)
RBC: 5.1 MIL/uL (ref 3.87–5.11)
RDW: 13.7 % (ref 11.5–15.5)
WBC: 6.7 10*3/uL (ref 4.0–10.5)

## 2018-02-24 LAB — URINALYSIS, ROUTINE W REFLEX MICROSCOPIC
Bilirubin Urine: NEGATIVE
Glucose, UA: NEGATIVE mg/dL
HGB URINE DIPSTICK: NEGATIVE
Ketones, ur: NEGATIVE mg/dL
NITRITE: NEGATIVE
PH: 7 (ref 5.0–8.0)
Protein, ur: NEGATIVE mg/dL
Specific Gravity, Urine: 1.008 (ref 1.005–1.030)

## 2018-02-24 MED ORDER — SODIUM CHLORIDE 0.9 % IV SOLN
1.0000 g | Freq: Once | INTRAVENOUS | Status: AC
  Administered 2018-02-24: 1 g via INTRAVENOUS
  Filled 2018-02-24: qty 10

## 2018-02-24 MED ORDER — NITROFURANTOIN MONOHYD MACRO 100 MG PO CAPS: 100.0000 mg | ORAL_CAPSULE | Freq: Two times a day (BID) | ORAL | 0 refills | Status: AC

## 2018-02-24 NOTE — ED Notes (Signed)
Lab called-- states blue top tube is grossly hemolyzed- unable to run Pt INR off of it- will need to be recollected.

## 2018-02-24 NOTE — ED Notes (Signed)
Per lab INR is running

## 2018-02-24 NOTE — ED Triage Notes (Signed)
Pt states she has been increasingly weak x 1 week. Tonight awoke from sleep to use the restroom and began to have the "shakes"  Unable to return to sleep. Normally ambulatory and independent. Feels like she has "no energy"

## 2018-02-24 NOTE — ED Provider Notes (Signed)
[facility] EMERGENCY DEPARTMENT Provider Note   CSN: [id number] Arrival date & time: 02/24/18  0426     History   Chief Complaint Chief Complaint  Patient presents with   Weakness    HPI Stacie Marquez is a 67 y.o. female.  Patient with h/o diabetes, PE on coumadin -- presents with 1 week history of feeling generally weak and fatigued. 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  Patient presents this morning because she awoke at approximately 1:30 AM with generalized full body shaking.  She does not report feeling cold and does not have any reported fevers.  She denies any URI symptoms, chest pain or cough.  No nausea, vomiting, or diarrhea.  No urinary symptoms.  No skin rashes or other problems.  His last INR check was yesterday and she has not yet received the results.  She reports starting on atorvastatin in May.  She has a follow-up appointment with Dr. Terrence Dupont on 03/03/2018.  The onset of this condition was acute. The course is constant. Aggravating factors: none. Alleviating factors: none.       Past Medical History:  Diagnosis Date   Ankle swelling    bilateral   Anxiety    Diabetes mellitus without complication (HCC)    Hypertension    Leg swelling    bilateral   Plantar fasciitis    left foot   Pre-diabetes    Rosacea     Patient Active Problem List   Diagnosis Date Noted   Acute pulmonary embolism ([place]) 08/21/2017   Cellulitis of abdominal wall 03/09/2017   Diabetes mellitus without complication ([place]) 01/60/1093   Morbid obesity ([place]) 02/15/2017   Skin tag 02/02/2017   Complex atypical endometrial hyperplasia 02/02/2017    Past Surgical History:  Procedure Laterality Date   ABDOMINAL HYSTERECTOMY     ACHILLES TENDON SURGERY     CESAREAN SECTION     CHOLECYSTECTOMY  07/27/2012   Procedure: LAPAROSCOPIC CHOLECYSTECTOMY WITH INTRAOPERATIVE CHOLANGIOGRAM;  Surgeon:  Leighton Ruff, MD;  Location: [facility];  Service: General;  Laterality: N/A;   COLONOSCOPY     [place] N/A 01/25/2017   Procedure: DILATATION & CURETTAGE/HYSTEROSCOPY WITH MYOSURE;  Surgeon: Olga Millers, MD;  Location: [facility];  Service: Gynecology;  Laterality: N/A;   FRACTURE SURGERY     right arm fracture and shoulder disclocation   ROBOTIC ASSISTED TOTAL HYSTERECTOMY WITH BILATERAL SALPINGO OOPHERECTOMY N/A 02/15/2017   Procedure: XI ROBOTIC ASSISTED TOTAL HYSTERECTOMY WITH BILATERAL SALPINGO OOPHORECTOMY;  Surgeon: Everitt Amber, MD;  Location: [facility];  Service: Gynecology;  Laterality: N/A;   SENTINEL NODE BIOPSY N/A 02/15/2017   Procedure: SENTINEL NODE BIOPSY AND LEFT THIGH BIOPSY;  Surgeon: Everitt Amber, MD;  Location: [facility];  Service: Gynecology;  Laterality: N/A;     OB History   None      Home Medications    Prior to Admission medications   Medication Sig Start Date End Date Taking? Authorizing Provider  acetaminophen (TYLENOL) 500 MG tablet Take 500 mg by mouth every 6 (six) hours as needed for mild pain or moderate pain.    [provider]  busPIRone (BUSPAR) 5 MG tablet Take 10 mg by mouth 2 (two) times daily.     [provider]  furosemide (LASIX) 40 MG tablet Take 40 mg by mouth as needed for edema.    [provider]  losartan (COZAAR) 50 MG tablet Take 50 mg by mouth daily.    [provider]  metFORMIN (GLUCOPHAGE-XR) 500 MG 24 hr tablet Take 500 mg by mouth at bedtime. 12/22/16   [provider]  nitroGLYCERIN (NITROSTAT) 0.4 MG SL tablet Place 0.4 mg under the tongue every 5 (five) minutes as needed for chest pain.    [provider]  warfarin (COUMADIN) 10 MG tablet Take 1 tablet (10 mg total) by mouth daily at 6 PM. 08/29/17   Charolette Forward, MD    Family History No family history on file.  Social History Social History   Tobacco Use   Smoking status: Never Smoker    Smokeless tobacco: Never Used  Substance Use Topics   Alcohol use: No   Drug use: No     Allergies   Penicillins   Review of Systems Review of Systems  Constitutional: Positive for fatigue. Negative for fever.  HENT: Negative for rhinorrhea and sore throat.   Eyes: Negative for redness.  Respiratory: Negative for cough.   Cardiovascular: Negative for chest pain.  Gastrointestinal: Negative for abdominal pain, diarrhea, nausea and vomiting.  Genitourinary: Negative for dysuria.  Musculoskeletal: Negative for myalgias.  Skin: Negative for rash.  Neurological: Positive for tremors and weakness. Negative for headaches.     Physical Exam Updated Vital Signs BP 133/79    Pulse 87    Temp (!) 97.5 F (36.4 C) (Oral)    Resp 10    Ht 5\' 4"  (1.626 m)    Wt 117.9 kg (260 lb)    LMP 08/21/2017    SpO2 96%    BMI 44.63 kg/m   Physical Exam  Constitutional: She is oriented to person, place, and time. She appears well-developed and well-nourished.  HENT:  Head: Normocephalic and atraumatic.  Right Ear: Tympanic membrane, external ear and ear canal normal.  Left Ear: Tympanic membrane, external ear and ear canal normal.  Nose: Nose normal.  Mouth/Throat: Uvula is midline, oropharynx is clear and moist and mucous membranes are normal.  Eyes: Pupils are equal, round, and reactive to light. Conjunctivae, EOM and lids are normal. Right eye exhibits no discharge. Left eye exhibits no discharge. Right eye exhibits no nystagmus. Left eye exhibits no nystagmus.  Neck: Normal range of motion. Neck supple.  Cardiovascular: Normal rate, regular rhythm and normal heart sounds.  No murmur heard. Pulmonary/Chest: Effort normal and breath sounds normal. No respiratory distress. She has no wheezes.  Abdominal: Soft. There is no tenderness. There is no rebound and no guarding.  Musculoskeletal: She exhibits no edema or tenderness.       Cervical back: She exhibits normal range of motion, no  tenderness and no bony tenderness.  Neurological: She is alert and oriented to person, place, and time. She has normal strength and normal reflexes. She displays no tremor. No cranial nerve deficit or sensory deficit. She displays a negative Romberg sign. Coordination and gait normal. GCS eye subscore is 4. GCS verbal subscore is 5. GCS motor subscore is 6.  Skin: Skin is warm and dry.  Psychiatric: She has a normal mood and affect.  Nursing note and vitals reviewed.    ED Treatments / Results  Labs (all labs ordered are listed, but only abnormal results are displayed) Labs Reviewed  BASIC METABOLIC PANEL - Abnormal; Notable for the following components:      Result Value   Glucose, Bld 156 (*)    All other components within normal limits  URINALYSIS,  ROUTINE W REFLEX MICROSCOPIC - Abnormal; Notable for the following components:   Color, Urine STRAW (*)    APPearance HAZY (*)    Leukocytes, UA MODERATE (*)    Bacteria, UA RARE (*)    All other components within normal limits  URINE CULTURE  CBC  PROTIME-INR  CBG MONITORING, ED    ED ECG REPORT   Date: 02/24/2018  Rate: 88  Rhythm: normal sinus rhythm  QRS Axis: normal  Intervals: normal  ST/T Wave abnormalities: normal  Conduction Disutrbances:none  Narrative Interpretation:   Old EKG Reviewed: unchanged from 08/22/17  I have personally reviewed the EKG tracing and agree with the computerized printout as noted.  Radiology No results found.  Procedures Procedures (including critical care time)  Medications Ordered in ED Medications  cefTRIAXone (ROCEPHIN) 1 g in sodium chloride 0.9 % 100 mL IVPB (1 g Intravenous New Bag/Given 02/24/18 0645)     Initial Impression / Assessment and Plan / ED Course  I have reviewed the triage vital signs and the nursing notes.  Pertinent labs & imaging results that were available during my care of the patient were reviewed by me and considered in my medical decision making (see  chart for details).     Patient seen and examined.  Work-up reviewed.  EKG reviewed.  UA suggestive of UTI.  This may be underlying etiology of patient's symptoms and will treat.  Do not suspect pneumonia.  Normal white blood cell count.  Will give dose of Rocephin here and start patient on oral antibiotics.  Will check INR given that recent draw is not available in computer system.  Vital signs reviewed and are as follows: BP 136/78    Pulse (!) 58    Temp (!) 97.5 F (36.4 C) (Oral)    Resp (!) 9    Ht 5\' 4"  (1.626 m)    Wt 117.9 kg (260 lb)    LMP 08/21/2017    SpO2 96%    BMI 44.63 kg/m   Patient discussed with and seen by Dr. Randal Buba.  9:22 AM blood for INR was sent to the lab but was never run.  When lab was called to check on it, blood had hemolyzed.  Will not redraw today.  Patient had INR checked yesterday and will follow up on the results by PCP.  She expects to hear back today about it.  In the meantime, we will discharged home with Macrobid.  Culture is pending.  Patient encouraged to follow-up for recheck early next week.  She states that she is able to do this.  Patient counseled to return if they have weakness in their arms or legs, slurred speech, trouble walking or talking, confusion, trouble with their balance, or if they have any other concerns. Patient verbalizes understanding and agrees with plan.    Final Clinical Impressions(s) / ED Diagnoses   Final diagnoses:  Acute cystitis without hematuria  Weakness   Patient with generalized weakness.  Work-up undertaken.  She has no focal neurological deficits.  White blood cell count is normal.  No signs of pneumonia on exam.  Her urine suggests infection.  Culture sent to confirm.  This is most likely cause for the patient's generalized weakness at this point.  No indications for hospitalization.  Dose of IV antibiotics given here.  Home on [address] --patient is 67 years old but continues to have good kidney function.  ED  Discharge Orders        Ordered  nitrofurantoin, macrocrystal-monohydrate, (MACROBID) 100 MG capsule  2 times daily     02/24/18 0905       Carlisle Cater, PA-C 02/24/18 1164    Palumbo, April, MD 02/27/18 0008

## 2018-02-24 NOTE — Discharge Instructions (Signed)
Please read and follow all provided instructions.  Your diagnoses today include:  1. Acute cystitis without hematuria   2. Weakness     Tests performed today include:  Blood counts and electrolytes  EKG  Urine test -suggestive of infection  Vital signs. See below for your results today.   Medications prescribed:   Macrobid - antibiotic for urinary tract infection  You have been prescribed an antibiotic medicine: take the entire course of medicine even if you are feeling better. Stopping early can cause the antibiotic not to work.  Take any prescribed medications only as directed.  Home care instructions:  Follow any educational materials contained in this packet.  BE VERY CAREFUL not to take multiple medicines containing Tylenol (also called acetaminophen). Doing so can lead to an overdose which can damage your liver and cause liver failure and possibly death.   Follow-up instructions: Please follow-up with your primary care provider in the next 3 days for further evaluation of your symptoms and recheck of your urine.   Return instructions:   Please return to the Emergency Department if you experience worsening symptoms.   Please return if you have any other emergent concerns.  Additional Information:  Your vital signs today were: BP (!) 147/85    Pulse 69    Temp (!) 97.5 F (36.4 C) (Oral)    Resp 10    Ht 5\' 4"  (1.626 m)    Wt 117.9 kg (260 lb)    LMP 08/21/2017    SpO2 100%    BMI 44.63 kg/m  If your blood pressure (BP) was elevated above 135/85 this visit, please have this repeated by your doctor within one month. --------------

## 2018-02-24 NOTE — ED Notes (Signed)
IV access clicked off in error.

## 2018-02-25 LAB — URINE CULTURE: CULTURE: NO GROWTH

## 2018-05-19 ENCOUNTER — Other Ambulatory Visit: Payer: Self-pay

## 2018-09-08 ENCOUNTER — Emergency Department (HOSPITAL_COMMUNITY): Payer: BC Managed Care – PPO

## 2018-09-08 ENCOUNTER — Emergency Department (HOSPITAL_COMMUNITY)
Admission: EM | Admit: 2018-09-08 | Discharge: 2018-09-08 | Disposition: A | Discharge status: Home / Self Care | Payer: BC Managed Care – PPO | Attending: Emergency Medicine | Admitting: Emergency Medicine

## 2018-09-08 DIAGNOSIS — Z7901 Long term (current) use of anticoagulants: Secondary | ICD-10-CM | POA: Insufficient documentation

## 2018-09-08 DIAGNOSIS — Z79899 Other long term (current) drug therapy: Secondary | ICD-10-CM | POA: Insufficient documentation

## 2018-09-08 DIAGNOSIS — Z7984 Long term (current) use of oral hypoglycemic drugs: Secondary | ICD-10-CM | POA: Diagnosis not present

## 2018-09-08 DIAGNOSIS — I10 Essential (primary) hypertension: Secondary | ICD-10-CM | POA: Insufficient documentation

## 2018-09-08 DIAGNOSIS — R251 Tremor, unspecified: Secondary | ICD-10-CM | POA: Diagnosis present

## 2018-09-08 DIAGNOSIS — I472 Ventricular tachycardia: Secondary | ICD-10-CM | POA: Diagnosis not present

## 2018-09-08 DIAGNOSIS — E119 Type 2 diabetes mellitus without complications: Secondary | ICD-10-CM | POA: Diagnosis not present

## 2018-09-08 DIAGNOSIS — I4729 Other ventricular tachycardia: Secondary | ICD-10-CM

## 2018-09-08 LAB — BASIC METABOLIC PANEL
ANION GAP: 9 (ref 5–15)
BUN: 13 mg/dL (ref 8–23)
CO2: 25 mmol/L (ref 22–32)
Calcium: 9.3 mg/dL (ref 8.9–10.3)
Chloride: 107 mmol/L (ref 98–111)
Creatinine, Ser: 0.77 mg/dL (ref 0.44–1.00)
GFR calc Af Amer: >60 mL/min (ref >60)
GLUCOSE: 162 mg/dL — AB (ref 70–99)
POTASSIUM: 4.2 mmol/L (ref 3.5–5.1)
Sodium: 141 mmol/L (ref 135–145)

## 2018-09-08 LAB — CBC
HCT: 43 % (ref 36.0–46.0)
Hemoglobin: 14.1 g/dL (ref 12.0–15.0)
MCH: 29.3 pg (ref 26.0–34.0)
MCHC: 32.8 g/dL (ref 30.0–36.0)
MCV: 89.2 fL (ref 80.0–100.0)
NRBC: 0 % (ref 0.0–0.2)
Platelets: 199 10*3/uL (ref 150–400)
RBC: 4.82 MIL/uL (ref 3.87–5.11)
RDW: 13.3 % (ref 11.5–15.5)
WBC: 8.5 10*3/uL (ref 4.0–10.5)

## 2018-09-08 LAB — PROTIME-INR
INR: 2.69
PROTHROMBIN TIME: 28.2 s — AB (ref 11.4–15.2)

## 2018-09-08 LAB — MAGNESIUM: MAGNESIUM: 1.9 mg/dL (ref 1.7–2.4)

## 2018-09-08 MED ORDER — LORAZEPAM 1 MG PO TABS
1.0000 mg | ORAL_TABLET | Freq: Once | ORAL | Status: AC
  Administered 2018-09-08: 1 mg via ORAL
  Filled 2018-09-08: qty 1

## 2018-09-08 MED ORDER — SODIUM CHLORIDE 0.9% FLUSH: 3.0000 mL | Freq: Once | INTRAVENOUS | Status: DC

## 2018-09-08 NOTE — Discharge Instructions (Addendum)
Return if you are having any problems.  Talk with Dr. Terrence Dupont about possible referral to a neurologist.

## 2018-09-08 NOTE — ED Triage Notes (Signed)
Pt here after cardiology MD called pt regarding heart monitor that she is wearing.  Pt initially not having any symptoms, then she began to fell anxious and called Harwani MD and he advised her to come to the ED.

## 2018-09-08 NOTE — ED Notes (Signed)
Family approaches. Explains 'Dr called and wants pt seen at hospital.' This EMT is sympathetic to family and explains wait times. Family is not please. States 'we will leave soon, this is unacceptable.'

## 2018-09-08 NOTE — ED Provider Notes (Signed)
[facility] EMERGENCY DEPARTMENT Provider Note   CSN: [id number] Arrival date & time: 09/08/18  0010     History   Chief Complaint Chief Complaint  Patient presents with   Irregular Heart Beat    HPI Stacie Marquez is a 68 y.o. female.  The history is provided by the patient.  She has history of diabetes, hypertension, pulmonary embolism and is anticoagulated on warfarin and was told to come to the hospital by her cardiologist.  She had an event monitor placed several days ago because of episodes of shaking.  She got a call from the monitoring company to ask if she was all right.  The monitoring company then called the cardiologist who called her and told her to come to the ED.  She denies feeling dizzy or lightheaded.  She denies any chest pain.  She denies palpitations.  She did not have a shaking episode today.  Past Medical History:  Diagnosis Date   Ankle swelling    bilateral   Anxiety    Diabetes mellitus without complication (HCC)    Hypertension    Leg swelling    bilateral   Plantar fasciitis    left foot   Pre-diabetes    Rosacea     Patient Active Problem List   Diagnosis Date Noted   Acute pulmonary embolism ([place]) 08/21/2017   Cellulitis of abdominal wall 03/09/2017   Diabetes mellitus without complication ([place]) 05/39/7673   Morbid obesity ([place]) 02/15/2017   Skin tag 02/02/2017   Complex atypical endometrial hyperplasia 02/02/2017    Past Surgical History:  Procedure Laterality Date   ABDOMINAL HYSTERECTOMY     ACHILLES TENDON SURGERY     CESAREAN SECTION     CHOLECYSTECTOMY  07/27/2012   Procedure: LAPAROSCOPIC CHOLECYSTECTOMY WITH INTRAOPERATIVE CHOLANGIOGRAM;  Surgeon: Leighton Ruff, MD;  Location: [facility];  Service: General;  Laterality: N/A;   COLONOSCOPY     [place] N/A 01/25/2017   Procedure: DILATATION & CURETTAGE/HYSTEROSCOPY WITH MYOSURE;  Surgeon: Olga Millers, MD;  Location: [facility];  Service: Gynecology;  Laterality: N/A;   FRACTURE SURGERY     right arm fracture and shoulder disclocation   ROBOTIC ASSISTED TOTAL HYSTERECTOMY WITH BILATERAL SALPINGO OOPHERECTOMY N/A 02/15/2017   Procedure: XI ROBOTIC ASSISTED TOTAL HYSTERECTOMY WITH BILATERAL SALPINGO OOPHORECTOMY;  Surgeon: Everitt Amber, MD;  Location: [facility];  Service: Gynecology;  Laterality: N/A;   SENTINEL NODE BIOPSY N/A 02/15/2017   Procedure: SENTINEL NODE BIOPSY AND LEFT THIGH BIOPSY;  Surgeon: Everitt Amber, MD;  Location: [facility];  Service: Gynecology;  Laterality: N/A;     OB History   No obstetric history on file.      Home Medications    Prior to Admission medications   Medication Sig Start Date End Date Taking? Authorizing Provider  acetaminophen (TYLENOL) 500 MG tablet Take 500 mg by mouth every 6 (six) hours as needed for mild pain or moderate pain.    [provider]  atorvastatin (LIPITOR) 20 MG tablet Take 20 mg by mouth daily.    [provider]  busPIRone (BUSPAR) 5 MG tablet Take 10 mg by mouth 2 (two) times daily.     [provider]  furosemide (LASIX) 40 MG tablet Take 20 mg by mouth daily as needed for fluid or edema.     [provider]  losartan (COZAAR) 50 MG tablet Take 50 mg by mouth daily.    [provider]  metFORMIN (GLUCOPHAGE-XR) 500 MG 24 hr tablet Take 500 mg by mouth at bedtime. 12/22/16   [provider]  nitrofurantoin, macrocrystal-monohydrate, (MACROBID) 100 MG capsule Take 1 capsule (100 mg total) by mouth 2 (two) times daily. 02/24/18   Carlisle Cater, PA-C  nitroGLYCERIN (NITROSTAT) 0.4 MG SL tablet Place 0.4 mg under the tongue every 5 (five) minutes as needed for chest pain.    [provider]  warfarin (COUMADIN) 10 MG tablet Take 1 tablet (10 mg total) by mouth daily at 6 PM. Patient taking differently: Take 3.75-7.5 mg by mouth daily at 6 PM. Sunday and Wednesday take 3.75 mg all  other days take 7.5 mg 08/29/17   Charolette Forward, MD    Family History No family history on file.  Social History Social History   Tobacco Use   Smoking status: Never Smoker   Smokeless tobacco: Never Used  Substance Use Topics   Alcohol use: No   Drug use: No     Allergies   Penicillins   Review of Systems Review of Systems  All other systems reviewed and are negative.    Physical Exam Updated Vital Signs BP (!) 179/79 (BP Location: Right Arm)    Pulse 61    Temp 98.1 F (36.7 C) (Oral)    Resp 16    LMP 08/21/2017    SpO2 98%   Physical Exam Vitals signs and nursing note reviewed.    68 year old female, resting comfortably and in no acute distress. Vital signs are significant for elevated systolic blood pressure. Oxygen saturation is 98%, which is normal. Head is normocephalic and atraumatic. PERRLA, EOMI. Oropharynx is clear. Neck is nontender and supple without adenopathy or JVD. Back is nontender and there is no CVA tenderness. Lungs are clear without rales, wheezes, or rhonchi. Chest is nontender. Heart has regular rate and rhythm without murmur. Abdomen is soft, flat, nontender without masses or hepatosplenomegaly and peristalsis is normoactive. Extremities have no cyanosis or edema, full range of motion is present. Skin is warm and dry without rash. Neurologic: Mental status is normal, cranial nerves are intact, there are no motor or sensory deficits.  ED Treatments / Results  Labs (all labs ordered are listed, but only abnormal results are displayed) Labs Reviewed  BASIC METABOLIC PANEL - Abnormal; Notable for the following components:      Result Value   Glucose, Bld 162 (*)    All other components within normal limits  PROTIME-INR - Abnormal; Notable for the following components:   Prothrombin Time 28.2 (*)    All other components within normal limits  CBC  MAGNESIUM    EKG EKG Interpretation  Date/Time:  Friday September 08 2018 00:18:47  EST Ventricular Rate:  63 PR Interval:  184 QRS Duration: 88 QT Interval:  430 QTC Calculation: 440 R Axis:   54 Text Interpretation:  Normal sinus rhythm with sinus arrhythmia Low voltage QRS Cannot rule out Anterior infarct , age undetermined Abnormal ECG When compared with ECG of 02/24/2018, No significant change was found Confirmed by Delora Fuel ([id number]) on 09/08/2018 12:23:34 AM   Radiology Dg Chest 2 View  Result Date: 09/08/2018 CLINICAL DATA:  68 year old female with irregular heart rate. EXAM: CHEST - 2 VIEW COMPARISON:  Chest CT dated 08/21/2017 FINDINGS: The lungs are clear. There is no pleural effusion or pneumothorax. Mild cardiomegaly. No acute osseous pathology. IMPRESSION: No active cardiopulmonary disease. Electronically Signed   By: Anner Crete M.D.   On: 09/08/2018 00:55  Mr Brain Wo Contrast  Result Date: 09/08/2018 CLINICAL DATA:  Seizure like activity. EXAM: MRI HEAD WITHOUT CONTRAST TECHNIQUE: Multiplanar, multiecho pulse sequences of the brain and surrounding structures were obtained without intravenous contrast. COMPARISON:  None. FINDINGS: Brain: There is no evidence of acute infarct, intracranial hemorrhage, mass, midline shift, or extra-axial fluid collection. The ventricles and sulci are normal. Small foci of T2 hyperintensity are present in the cerebral white matter bilaterally, nonspecific though compatible with mild chronic small vessel ischemic disease. Vascular: Major intracranial vascular flow voids are preserved. Skull and upper cervical spine: Unremarkable bone marrow signal. Sinuses/Orbits: Unremarkable orbits. Small bilateral mastoid effusions. Clear paranasal sinuses. Other: None. IMPRESSION: 1. No acute intracranial abnormality. 2. Mild chronic small vessel ischemic disease. Electronically Signed   By: Logan Bores M.D.   On: 09/08/2018 06:43    Procedures Procedures   Medications Ordered in ED Medications  sodium chloride flush (NS) 0.9 %  injection 3 mL (has no administration in time range)     Initial Impression / Assessment and Plan / ED Course  I have reviewed the triage vital signs and the nursing notes.  Pertinent labs & imaging results that were available during my care of the patient were reviewed by me and considered in my medical decision making (see chart for details).  Presumed arrhythmia.  Rhythm is sinus on monitor and on ECG.  Chest x-ray is normal.  Labs are unremarkable including therapeutic INR.  Old records are reviewed, and she has no relevant past visits.  Case is discussed with Dr. Terrence Dupont.  He states that he had been told she had a 3 beat run of nonsustained ventricular tachycardia.  He requests magnesium level be checked and patient be sent for MRI scan to evaluate for possible cause for seizures.  These are ordered.  Magnesium level is normal.  MRI of the brain shows small vessel ischemic disease, but no acute process.  Patient is discharged with instructions to follow-up with her cardiologist, consider referral to neurology for evaluation of shaking episodes.  Patient is advised to hit the button on her event monitor if she has a shaking spell to see if she has an arrhythmia at the time shaking spell starts.  Return precautions discussed.  Final Clinical Impressions(s) / ED Diagnoses   Final diagnoses:  Nonsustained ventricular tachycardia [facility])    ED Discharge Orders    None       Delora Fuel, MD 59/74/16 [phone]

## 2018-09-08 NOTE — ED Notes (Signed)
Pt to MRI

## 2018-10-19 ENCOUNTER — Other Ambulatory Visit: Payer: Self-pay | Admitting: Obstetrics and Gynecology

## 2018-10-19 DIAGNOSIS — R928 Other abnormal and inconclusive findings on diagnostic imaging of breast: Secondary | ICD-10-CM

## 2018-10-25 ENCOUNTER — Other Ambulatory Visit: Payer: Self-pay | Admitting: Obstetrics and Gynecology

## 2018-10-25 ENCOUNTER — Ambulatory Visit
Admission: RE | Admit: 2018-10-25 | Discharge: 2018-10-25 | Disposition: A | Discharge status: Home / Self Care | Payer: BC Managed Care – PPO | Source: Ambulatory Visit | Attending: Obstetrics and Gynecology | Admitting: Obstetrics and Gynecology

## 2018-10-25 DIAGNOSIS — R928 Other abnormal and inconclusive findings on diagnostic imaging of breast: Secondary | ICD-10-CM

## 2018-10-27 ENCOUNTER — Ambulatory Visit
Admission: RE | Admit: 2018-10-27 | Discharge: 2018-10-27 | Disposition: A | Discharge status: Home / Self Care | Payer: BC Managed Care – PPO | Source: Ambulatory Visit | Attending: Obstetrics and Gynecology | Admitting: Obstetrics and Gynecology

## 2018-10-27 DIAGNOSIS — R928 Other abnormal and inconclusive findings on diagnostic imaging of breast: Secondary | ICD-10-CM

## 2018-11-06 HISTORY — PX: BREAST BIOPSY: SHX20

## 2019-08-28 ENCOUNTER — Emergency Department (HOSPITAL_COMMUNITY)
Admission: EM | Admit: 2019-08-28 | Discharge: 2019-08-28 | Disposition: A | Discharge status: Home / Self Care | Payer: BC Managed Care – PPO | Attending: Emergency Medicine | Admitting: Emergency Medicine

## 2019-08-28 ENCOUNTER — Encounter (HOSPITAL_COMMUNITY): Payer: Self-pay | Admitting: *Deleted

## 2019-08-28 ENCOUNTER — Other Ambulatory Visit: Payer: Self-pay

## 2019-08-28 ENCOUNTER — Emergency Department (HOSPITAL_COMMUNITY): Payer: BC Managed Care – PPO

## 2019-08-28 DIAGNOSIS — Z79899 Other long term (current) drug therapy: Secondary | ICD-10-CM | POA: Insufficient documentation

## 2019-08-28 DIAGNOSIS — I10 Essential (primary) hypertension: Secondary | ICD-10-CM | POA: Diagnosis not present

## 2019-08-28 DIAGNOSIS — R419 Unspecified symptoms and signs involving cognitive functions and awareness: Secondary | ICD-10-CM | POA: Diagnosis not present

## 2019-08-28 DIAGNOSIS — Z7901 Long term (current) use of anticoagulants: Secondary | ICD-10-CM | POA: Diagnosis not present

## 2019-08-28 DIAGNOSIS — Z86711 Personal history of pulmonary embolism: Secondary | ICD-10-CM | POA: Insufficient documentation

## 2019-08-28 DIAGNOSIS — R519 Headache, unspecified: Secondary | ICD-10-CM | POA: Diagnosis present

## 2019-08-28 LAB — CBC
HCT: 45.3 % (ref 36.0–46.0)
Hemoglobin: 14.7 g/dL (ref 12.0–15.0)
MCH: 30.2 pg (ref 26.0–34.0)
MCHC: 32.5 g/dL (ref 30.0–36.0)
MCV: 93.2 fL (ref 80.0–100.0)
Platelets: 212 10*3/uL (ref 150–400)
RBC: 4.86 MIL/uL (ref 3.87–5.11)
RDW: 13.6 % (ref 11.5–15.5)
WBC: 6.9 10*3/uL (ref 4.0–10.5)
nRBC: 0 % (ref 0.0–0.2)

## 2019-08-28 LAB — COMPREHENSIVE METABOLIC PANEL
ALT: 31 U/L (ref 0–44)
AST: 27 U/L (ref 15–41)
Albumin: 4 g/dL (ref 3.5–5.0)
Alkaline Phosphatase: 48 U/L (ref 38–126)
Anion gap: 13 (ref 5–15)
BUN: 9 mg/dL (ref 8–23)
CO2: 26 mmol/L (ref 22–32)
Calcium: 9.6 mg/dL (ref 8.9–10.3)
Chloride: 104 mmol/L (ref 98–111)
Creatinine, Ser: 0.92 mg/dL (ref 0.44–1.00)
GFR calc Af Amer: >60 mL/min (ref >60)
GFR calc non Af Amer: >60 mL/min (ref >60)
Glucose, Bld: 137 mg/dL — ABNORMAL HIGH (ref 70–99)
Potassium: 4 mmol/L (ref 3.5–5.1)
Sodium: 143 mmol/L (ref 135–145)
Total Bilirubin: 0.9 mg/dL (ref 0.3–1.2)
Total Protein: 6.8 g/dL (ref 6.5–8.1)

## 2019-08-28 MED ORDER — ACETAMINOPHEN 325 MG PO TABS
650.0000 mg | ORAL_TABLET | Freq: Once | ORAL | Status: AC
  Administered 2019-08-28: 650 mg via ORAL
  Filled 2019-08-28: qty 2

## 2019-08-28 MED ORDER — SODIUM CHLORIDE 0.9% FLUSH: 3.0000 mL | Freq: Once | INTRAVENOUS | Status: DC

## 2019-08-28 NOTE — Discharge Instructions (Addendum)
Continue your home medications as previously prescribed. Follow-up with your primary care provider. Return to the ED if you start to have worsening symptoms, chest pain, shortness of breath, blurry vision, numbness in arms or legs.

## 2019-08-28 NOTE — ED Notes (Signed)
Pt discharge instructions reviewed with the patient. The patient verbalized understanding. Pt discharged. 

## 2019-08-28 NOTE — ED Provider Notes (Addendum)
[facility] EMERGENCY DEPARTMENT Provider Note   CSN: [id number] Arrival date & time: 08/28/19  0747     History Chief Complaint  Patient presents with  . Hypertension  . Headache    Stacie Marquez is a 69 y.o. female with a past medical history of hypertension, prediabetes, anxiety presenting to the ED with a chief complaint of anxiety, headache, high blood pressure.  Has had 2-3 episodes in the past week of feeling "shaky, nervous" but denies palpitations or chest pain, shortness of breath. These symptoms will occur without specific aggravating factor.  She was told in the past by her cardiologist after Holter monitor showing that she had abnormal heartbeat and was placed on amiodarone.  States that this does not feel similar to the time that she was diagnosed with a PE.  She reports compliance with her home Eliquis.  Denies history of headache in the past.  She has had minimal improvement noted with Tylenol.  Denies any vision changes, numbness in arms or legs, vomiting, urinary symptoms, loss of consciousness, leg swelling, injuries or falls.  HPI     Past Medical History:  Diagnosis Date  . Ankle swelling    bilateral  . Anxiety   . Diabetes mellitus without complication ([place])   . Hypertension   . Leg swelling    bilateral  . Plantar fasciitis    left foot  . Pre-diabetes   . Rosacea     Patient Active Problem List   Diagnosis Date Noted  . Acute pulmonary embolism ([place]) 08/21/2017  . Cellulitis of abdominal wall 03/09/2017  . Diabetes mellitus without complication ([place]) 123XX123  . Morbid obesity ([place]) 02/15/2017  . Skin tag 02/02/2017  . Complex atypical endometrial hyperplasia 02/02/2017    Past Surgical History:  Procedure Laterality Date  . ABDOMINAL HYSTERECTOMY    . ACHILLES TENDON SURGERY    . CESAREAN SECTION    . CHOLECYSTECTOMY  07/27/2012   Procedure: LAPAROSCOPIC CHOLECYSTECTOMY WITH INTRAOPERATIVE CHOLANGIOGRAM;  Surgeon:  Leighton Ruff, MD;  Location: [facility];  Service: General;  Laterality: N/A;  . COLONOSCOPY    . DILATATION & CURETTAGE/HYSTEROSCOPY WITH MYOSURE N/A 01/25/2017   Procedure: DILATATION & CURETTAGE/HYSTEROSCOPY WITH MYOSURE;  Surgeon: Olga Millers, MD;  Location: [facility];  Service: Gynecology;  Laterality: N/A;  . FRACTURE SURGERY     right arm fracture and shoulder disclocation  . ROBOTIC ASSISTED TOTAL HYSTERECTOMY WITH BILATERAL SALPINGO OOPHERECTOMY N/A 02/15/2017   Procedure: XI ROBOTIC ASSISTED TOTAL HYSTERECTOMY WITH BILATERAL SALPINGO OOPHORECTOMY;  Surgeon: Everitt Amber, MD;  Location: [facility];  Service: Gynecology;  Laterality: N/A;  . SENTINEL NODE BIOPSY N/A 02/15/2017   Procedure: SENTINEL NODE BIOPSY AND LEFT THIGH BIOPSY;  Surgeon: Everitt Amber, MD;  Location: [facility];  Service: Gynecology;  Laterality: N/A;     OB History   No obstetric history on file.     History reviewed. No pertinent family history.  Social History   Tobacco Use  . Smoking status: Never Smoker  . Smokeless tobacco: Never Used  Substance Use Topics  . Alcohol use: No  . Drug use: No    Home Medications Prior to Admission medications   Medication Sig Start Date End Date Taking? Authorizing Provider  acetaminophen (TYLENOL) 500 MG tablet Take 500 mg by mouth every 6 (six) hours as needed for mild pain or moderate pain.    [provider]  atorvastatin (LIPITOR) 20 MG tablet Take 20 mg by mouth daily.  [provider]  busPIRone (BUSPAR) 5 MG tablet Take 10 mg by mouth 2 (two) times daily.     [provider]  furosemide (LASIX) 40 MG tablet Take 20 mg by mouth daily as needed for fluid or edema.     [provider]  losartan (COZAAR) 50 MG tablet Take 50 mg by mouth daily.    [provider]  metFORMIN (GLUCOPHAGE-XR) 500 MG 24 hr tablet Take 500 mg by mouth at bedtime. 12/22/16   [provider]  nitrofurantoin, macrocrystal-monohydrate, (MACROBID)  100 MG capsule Take 1 capsule (100 mg total) by mouth 2 (two) times daily. 02/24/18   Carlisle Cater, PA-C  nitroGLYCERIN (NITROSTAT) 0.4 MG SL tablet Place 0.4 mg under the tongue every 5 (five) minutes as needed for chest pain.    [provider]  warfarin (COUMADIN) 10 MG tablet Take 1 tablet (10 mg total) by mouth daily at 6 PM. Patient taking differently: Take 3.75-7.5 mg by mouth daily at 6 PM. Sunday and Wednesday take 3.75 mg all other days take 7.5 mg 08/29/17   Charolette Forward, MD    Allergies    Penicillins  Review of Systems   Review of Systems  Constitutional: Negative for appetite change, chills and fever.  HENT: Negative for ear pain, rhinorrhea, sneezing and sore throat.   Eyes: Negative for photophobia and visual disturbance.  Respiratory: Negative for cough, chest tightness, shortness of breath and wheezing.   Cardiovascular: Negative for chest pain and palpitations.  Gastrointestinal: Negative for abdominal pain, blood in stool, constipation, diarrhea, nausea and vomiting.  Genitourinary: Negative for dysuria, hematuria and urgency.  Musculoskeletal: Negative for myalgias.  Skin: Negative for rash.  Neurological: Positive for tremors and headaches. Negative for dizziness, weakness and light-headedness.    Physical Exam Updated Vital Signs BP (!) 133/48 (BP Location: Right Arm)   Pulse 72   Temp 97.7 F (36.5 C) (Oral)   Resp 18   LMP 08/21/2017   SpO2 98%   Physical Exam Vitals and nursing note reviewed.  Constitutional:      General: She is not in acute distress.    Appearance: She is well-developed. She is obese.     Comments: Speaking in complete sentences without difficulty.  No tremors noted.  HENT:     Head: Normocephalic and atraumatic.     Nose: Nose normal.  Eyes:     General: No scleral icterus.       Left eye: No discharge.     Conjunctiva/sclera: Conjunctivae normal.  Cardiovascular:     Rate and Rhythm: Normal rate and regular rhythm.      Heart sounds: Normal heart sounds. No murmur. No friction rub. No gallop.   Pulmonary:     Effort: Pulmonary effort is normal. No respiratory distress.     Breath sounds: Normal breath sounds.  Abdominal:     General: Bowel sounds are normal. There is no distension.     Palpations: Abdomen is soft.     Tenderness: There is no abdominal tenderness. There is no guarding.  Musculoskeletal:        General: Normal range of motion.     Cervical back: Normal range of motion and neck supple.     Right lower leg: No edema.     Left lower leg: No edema.  Skin:    General: Skin is warm and dry.     Findings: No rash.  Neurological:     Mental Status: She is alert.  Motor: No abnormal muscle tone.     Coordination: Coordination normal.     ED Results / Procedures / Treatments   Labs (all labs ordered are listed, but only abnormal results are displayed) Labs Reviewed  COMPREHENSIVE METABOLIC PANEL - Abnormal; Notable for the following components:      Result Value   Glucose, Bld 137 (*)    All other components within normal limits  CBC    EKG EKG Interpretation  Date/Time:  Tuesday August 28 2019 14:58:23 EST Ventricular Rate:  47 PR Interval:    QRS Duration: 102 QT Interval:  504 QTC Calculation: 446 R Axis:   78 Text Interpretation: Sinus bradycardia Nonspecific T wave abnormality Confirmed by Lajean Saver [phone]) on 08/28/2019 3:03:42 PM   Radiology DG Chest 2 View  Result Date: 08/28/2019 CLINICAL DATA:  Anxiety. EXAM: CHEST - 2 VIEW COMPARISON:  09/08/2018. FINDINGS: Mediastinum hilar structures normal. Stable cardiomegaly. No focal infiltrate or edema. No pleural effusion or pneumothorax. Degenerative change thoracic spine. Surgical clips upper abdomen. IMPRESSION: 1.  Stable cardiomegaly. 2.  No acute pulmonary disease. Electronically Signed   By: Marcello Moores  Register   On: 08/28/2019 14:21   CT Head Wo Contrast  Result Date: 08/28/2019 CLINICAL DATA:  Headaches and  hypertension EXAM: CT HEAD WITHOUT CONTRAST TECHNIQUE: Contiguous axial images were obtained from the base of the skull through the vertex without intravenous contrast. COMPARISON:  None. FINDINGS: Brain: No evidence of acute infarction, hemorrhage, hydrocephalus, extra-axial collection or mass lesion/mass effect. Vascular: No hyperdense vessel or unexpected calcification. Skull: Normal. Negative for fracture or focal lesion. Sinuses/Orbits: No acute finding. Other: None. IMPRESSION: No acute intracranial abnormality noted. Electronically Signed   By: Inez Catalina M.D.   On: 08/28/2019 15:20    Procedures Procedures (including critical care time)  Medications Ordered in ED Medications  sodium chloride flush (NS) 0.9 % injection 3 mL (has no administration in time range)  acetaminophen (TYLENOL) tablet 650 mg (has no administration in time range)    ED Course  I have reviewed the triage vital signs and the nursing notes.  Pertinent labs & imaging results that were available during my care of the patient were reviewed by me and considered in my medical decision making (see chart for details).    MDM Rules/Calculators/A&P                      69 year old female with a past medical history of hypertension, prediabetes, anxiety presenting to the ED with a chief complaint of anxiety, headache and high blood pressure.  In the past week been having 2-3 episodes of feeling "shaky, nervous" but denies palpitations or shortness of breath or chest pain.  No specific trigger to the symptoms.  Has been compliant with her home medication including her Eliquis, amiodarone and losartan.  On exam she is overall well-appearing.  She complains of a headache which she has not had in the past.  No neurological deficits noted on my examination.  No lower extremity edema, erythema or calf tenderness of concern me for DVT more so also because she has been compliant with her anticoagulant.  CBC, CMP unremarkable.  Chest  x-ray is unremarkable.  EKG shows sinus bradycardia without ischemic changes. CT head unremarkable. There are no headache characteristics that are lateralizing or concerning for increased ICP, infectious or vascular cause of her symptoms. No personal or family history of aneurysms.  We will have her continue home medications, follow-up with PCP and  return for worsening symptoms.  Patient is hemodynamically stable, in NAD, and able to ambulate in the ED. Evaluation does not show pathology that would require ongoing emergent intervention or inpatient treatment. I explained the diagnosis to the patient. Pain has been managed and has no complaints prior to discharge. Patient is comfortable with above plan and is stable for discharge at this time. All questions were answered prior to disposition. Strict return precautions for returning to the ED were discussed. Encouraged follow up with PCP.   An After Visit Summary was printed and given to the patient.   Portions of this note were generated with [employer]. Dictation errors may occur despite best attempts at proofreading.  Final Clinical Impression(s) / ED Diagnoses Final diagnoses:  Essential hypertension  Bad headache    Rx / DC Orders ED Discharge Orders    None        Delia Heady, PA-C 08/28/19 1525    Lajean Saver, MD 08/29/19 1656

## 2019-08-28 NOTE — ED Triage Notes (Signed)
Pt reports having intermittent episodes of feeling "shakey, nervous, having a headache and runny nose and high blood pressure." had episode on Sunday and today. No acute distress is noted at triage, bp 152/63 at triage.

## 2019-10-28 ENCOUNTER — Ambulatory Visit: Payer: BC Managed Care – PPO

## 2019-12-19 ENCOUNTER — Other Ambulatory Visit: Payer: Self-pay | Admitting: Obstetrics and Gynecology

## 2019-12-19 DIAGNOSIS — Z1231 Encounter for screening mammogram for malignant neoplasm of breast: Secondary | ICD-10-CM

## 2019-12-26 ENCOUNTER — Other Ambulatory Visit: Payer: Self-pay

## 2019-12-26 ENCOUNTER — Ambulatory Visit
Admission: RE | Admit: 2019-12-26 | Discharge: 2019-12-26 | Disposition: A | Discharge status: Home / Self Care | Payer: Medicare PPO | Source: Ambulatory Visit | Attending: Obstetrics and Gynecology | Admitting: Obstetrics and Gynecology

## 2019-12-26 DIAGNOSIS — Z1231 Encounter for screening mammogram for malignant neoplasm of breast: Secondary | ICD-10-CM

## 2020-01-03 ENCOUNTER — Emergency Department (HOSPITAL_COMMUNITY)
Admission: EM | Admit: 2020-01-03 | Discharge: 2020-01-03 | Disposition: A | Discharge status: Home / Self Care | Payer: Medicare PPO | Attending: Emergency Medicine | Admitting: Emergency Medicine

## 2020-01-03 ENCOUNTER — Emergency Department (HOSPITAL_COMMUNITY): Payer: Medicare PPO

## 2020-01-03 ENCOUNTER — Other Ambulatory Visit: Payer: Self-pay

## 2020-01-03 ENCOUNTER — Encounter (HOSPITAL_COMMUNITY): Payer: Self-pay | Admitting: Emergency Medicine

## 2020-01-03 DIAGNOSIS — R519 Headache, unspecified: Secondary | ICD-10-CM | POA: Diagnosis not present

## 2020-01-03 DIAGNOSIS — Z79899 Other long term (current) drug therapy: Secondary | ICD-10-CM | POA: Insufficient documentation

## 2020-01-03 DIAGNOSIS — Z7984 Long term (current) use of oral hypoglycemic drugs: Secondary | ICD-10-CM | POA: Diagnosis not present

## 2020-01-03 DIAGNOSIS — M542 Cervicalgia: Secondary | ICD-10-CM | POA: Diagnosis present

## 2020-01-03 DIAGNOSIS — E119 Type 2 diabetes mellitus without complications: Secondary | ICD-10-CM | POA: Insufficient documentation

## 2020-01-03 DIAGNOSIS — I10 Essential (primary) hypertension: Secondary | ICD-10-CM | POA: Diagnosis not present

## 2020-01-03 DIAGNOSIS — M5412 Radiculopathy, cervical region: Secondary | ICD-10-CM | POA: Insufficient documentation

## 2020-01-03 MED ORDER — HYDROCODONE-ACETAMINOPHEN 5-325 MG PO TABS: 1.0000 | ORAL_TABLET | Freq: Four times a day (QID) | ORAL | 0 refills | Status: AC | PRN

## 2020-01-03 NOTE — ED Notes (Signed)
Pt ambulatory to waiting room. Pt verbalized understanding of discharge instructions.   

## 2020-01-03 NOTE — ED Provider Notes (Signed)
[facility] EMERGENCY DEPARTMENT Provider Note   CSN: [id number] Arrival date & time: 01/03/20  2141     History Chief Complaint  Patient presents with  . Neck Pain    Stacie Marquez is a 69 y.o. female.  Patient complains of neck pain and base of her skull pain along with pain radiating down her left arm.  The history is provided by the patient, medical records and a relative. No language interpreter was used.  Neck Pain Pain location:  Generalized neck Quality:  Aching Pain radiates to:  Does not radiate Pain severity:  Moderate Pain is:  Worse during the day Onset quality:  Sudden Timing:  Constant Progression:  Worsening Chronicity:  New Context: not fall   Relieved by:  Nothing Associated symptoms: headaches   Associated symptoms: no chest pain        Past Medical History:  Diagnosis Date  . Ankle swelling    bilateral  . Anxiety   . Diabetes mellitus without complication ([place])   . Hypertension   . Leg swelling    bilateral  . Plantar fasciitis    left foot  . Pre-diabetes   . Rosacea     Patient Active Problem List   Diagnosis Date Noted  . Acute pulmonary embolism ([place]) 08/21/2017  . Cellulitis of abdominal wall 03/09/2017  . Diabetes mellitus without complication ([place]) 123XX123  . Morbid obesity ([place]) 02/15/2017  . Skin tag 02/02/2017  . Complex atypical endometrial hyperplasia 02/02/2017    Past Surgical History:  Procedure Laterality Date  . ABDOMINAL HYSTERECTOMY    . ACHILLES TENDON SURGERY    . CESAREAN SECTION    . CHOLECYSTECTOMY  07/27/2012   Procedure: LAPAROSCOPIC CHOLECYSTECTOMY WITH INTRAOPERATIVE CHOLANGIOGRAM;  Surgeon: Leighton Ruff, MD;  Location: [facility];  Service: General;  Laterality: N/A;  . COLONOSCOPY    . DILATATION & CURETTAGE/HYSTEROSCOPY WITH MYOSURE N/A 01/25/2017   Procedure: DILATATION & CURETTAGE/HYSTEROSCOPY WITH MYOSURE;  Surgeon: Olga Millers, MD;  Location: [facility];  Service: Gynecology;   Laterality: N/A;  . FRACTURE SURGERY     right arm fracture and shoulder disclocation  . ROBOTIC ASSISTED TOTAL HYSTERECTOMY WITH BILATERAL SALPINGO OOPHERECTOMY N/A 02/15/2017   Procedure: XI ROBOTIC ASSISTED TOTAL HYSTERECTOMY WITH BILATERAL SALPINGO OOPHORECTOMY;  Surgeon: Everitt Amber, MD;  Location: [facility];  Service: Gynecology;  Laterality: N/A;  . SENTINEL NODE BIOPSY N/A 02/15/2017   Procedure: SENTINEL NODE BIOPSY AND LEFT THIGH BIOPSY;  Surgeon: Everitt Amber, MD;  Location: [facility];  Service: Gynecology;  Laterality: N/A;     OB History   No obstetric history on file.     No family history on file.  Social History   Tobacco Use  . Smoking status: Never Smoker  . Smokeless tobacco: Never Used  Substance Use Topics  . Alcohol use: No  . Drug use: No    Home Medications Prior to Admission medications   Medication Sig Start Date End Date Taking? Authorizing Provider  acetaminophen (TYLENOL) 500 MG tablet Take 500 mg by mouth every 6 (six) hours as needed for mild pain or moderate pain.    [provider]  atorvastatin (LIPITOR) 20 MG tablet Take 20 mg by mouth daily.    [provider]  busPIRone (BUSPAR) 5 MG tablet Take 10 mg by mouth 2 (two) times daily.     [provider]  furosemide (LASIX) 40 MG tablet Take 20 mg by mouth daily as needed for fluid or edema.  [provider]  HYDROcodone-acetaminophen (NORCO/VICODIN) 5-325 MG tablet Take 1 tablet by mouth every 6 (six) hours as needed. 01/03/20   [place] Ferguson, MD  losartan (COZAAR) 50 MG tablet Take 50 mg by mouth daily.    [provider]  metFORMIN (GLUCOPHAGE-XR) 500 MG 24 hr tablet Take 500 mg by mouth at bedtime. 12/22/16   [provider]  nitrofurantoin, macrocrystal-monohydrate, (MACROBID) 100 MG capsule Take 1 capsule (100 mg total) by mouth 2 (two) times daily. 02/24/18   Carlisle Cater, PA-C  nitroGLYCERIN (NITROSTAT) 0.4 MG SL tablet Place 0.4 mg under the  tongue every 5 (five) minutes as needed for chest pain.    [provider]  warfarin (COUMADIN) 10 MG tablet Take 1 tablet (10 mg total) by mouth daily at 6 PM. Patient taking differently: Take 3.75-7.5 mg by mouth daily at 6 PM. Sunday and Wednesday take 3.75 mg all other days take 7.5 mg 08/29/17   Charolette Forward, MD    Allergies    Penicillins  Review of Systems   Review of Systems  Constitutional: Negative for appetite change and fatigue.  HENT: Negative for congestion, ear discharge and sinus pressure.   Eyes: Negative for discharge.  Respiratory: Negative for cough.   Cardiovascular: Negative for chest pain.  Gastrointestinal: Negative for abdominal pain and diarrhea.  Genitourinary: Negative for frequency and hematuria.  Musculoskeletal: Positive for neck pain. Negative for back pain.  Skin: Negative for rash.  Neurological: Positive for headaches. Negative for seizures.  Psychiatric/Behavioral: Negative for hallucinations.    Physical Exam Updated Vital Signs BP (!) 161/71 (BP Location: Right Arm)   Pulse 72   Resp 19   Ht 5\' 4"  (1.626 m)   Wt 113.4 kg   LMP 08/21/2017   SpO2 98%   BMI 42.91 kg/m   Physical Exam Vitals and nursing note reviewed.  Constitutional:      Appearance: She is well-developed.  HENT:     Head: Normocephalic.     Nose: Nose normal.  Eyes:     General: No scleral icterus.    Conjunctiva/sclera: Conjunctivae normal.  Neck:     Thyroid: No thyromegaly.  Cardiovascular:     Rate and Rhythm: Normal rate and regular rhythm.     Heart sounds: No murmur. No friction rub. No gallop.   Pulmonary:     Breath sounds: No stridor. No wheezing or rales.  Chest:     Chest wall: No tenderness.  Abdominal:     General: There is no distension.     Tenderness: There is no abdominal tenderness. There is no rebound.  Musculoskeletal:        General: Normal range of motion.     Cervical back: Neck supple. Tenderness present.   Lymphadenopathy:     Cervical: No cervical adenopathy.  Skin:    Findings: No erythema or rash.  Neurological:     Mental Status: She is alert and oriented to person, place, and time.     Motor: No abnormal muscle tone.     Coordination: Coordination normal.  Psychiatric:        Behavior: Behavior normal.     ED Results / Procedures / Treatments   Labs (all labs ordered are listed, but only abnormal results are displayed) Labs Reviewed - No data to display  EKG None  Radiology CT Head Wo Contrast  Result Date: 01/03/2020 CLINICAL DATA:  Posterior headache EXAM: CT HEAD WITHOUT CONTRAST TECHNIQUE: Contiguous axial images were obtained from  the base of the skull through the vertex without intravenous contrast. COMPARISON:  MRI 09/08/2010 FINDINGS: Brain: No acute intracranial abnormality. Specifically, no hemorrhage, hydrocephalus, mass lesion, acute infarction, or significant intracranial injury. Vascular: No hyperdense vessel or unexpected calcification. Skull: No acute calvarial abnormality. Sinuses/Orbits: Visualized paranasal sinuses and mastoids clear. Orbital soft tissues unremarkable. Other: None IMPRESSION: No acute intracranial abnormality. Electronically Signed   By: Rolm Baptise M.D.   On: 01/03/2020 22:32   CT Cervical Spine Wo Contrast  Result Date: 01/03/2020 CLINICAL DATA:  Neck pain radiating to left shoulder EXAM: CT CERVICAL SPINE WITHOUT CONTRAST TECHNIQUE: Multidetector CT imaging of the cervical spine was performed without intravenous contrast. Multiplanar CT image reconstructions were also generated. COMPARISON:  None. FINDINGS: Alignment: Normal Skull base and vertebrae: No acute fracture. No primary bone lesion or focal pathologic process. Soft tissues and spinal canal: No prevertebral fluid or swelling. No visible canal hematoma. 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 Diffuse degenerative disc disease. Fusion of the facet joints bilaterally at C3-4 and C4-5 as  well as CT 3 on the right. Mild left neural foraminal narrowing at C3-4. Upper chest: No acute findings Other: None IMPRESSION: Fusion as described above. Degenerative disc and facet disease. No acute bony abnormality. Electronically Signed   By: Rolm Baptise M.D.   On: 01/03/2020 22:32    Procedures Procedures (including critical care time)  Medications Ordered in ED Medications - No data to display  ED Course  I have reviewed the triage vital signs and the nursing notes.  Pertinent labs & imaging results that were available during my care of the patient were reviewed by me and considered in my medical decision making (see chart for details).    MDM Rules/Calculators/A&P                      Patient with radicular pain down her left arm.  She is given some Vicodin and referred to neurosurgery      This patient presents to the ED for concern of neck and headache this involves an extensive number of treatment options, and is a complaint that carries with it a high risk of complications and morbidity.  The differential diagnosis includes cerebral bleed radiculopathy   Lab Tests:   Medicines ordered:  Imaging Studies ordered:   I ordered imaging studies which included CT head neck and  I independently visualized and interpreted imaging which showed cervical disc disease  Additional history obtained:   Additional history obtained from significant other  Previous records obtained and reviewed   Consultations Obtained:     Reevaluation:  After the interventions stated above, I reevaluated the patient and found unchanged  Critical Interventions:  .   Final Clinical Impression(s) / ED Diagnoses Final diagnoses:  Cervical radiculopathy    Rx / DC Orders ED Discharge Orders         Ordered    HYDROcodone-acetaminophen (NORCO/VICODIN) 5-325 MG tablet  Every 6 hours PRN     01/03/20 2310           [place] Ferguson, MD 01/05/20 1057

## 2020-01-03 NOTE — ED Triage Notes (Signed)
Pt arrives to ER with C/O posterior head and neck pain X 1 week. Pt states the pain radiates to her left shoulder.

## 2020-01-03 NOTE — Discharge Instructions (Addendum)
Follow-up with Dr. Arnoldo Morale or one of his partners in the next couple weeks.  Take the Vicodin as needed for pain that is not relieved by Tylenol

## 2020-02-04 ENCOUNTER — Encounter: Payer: Self-pay | Admitting: Physical Therapy

## 2020-02-04 ENCOUNTER — Other Ambulatory Visit: Payer: Self-pay

## 2020-02-04 ENCOUNTER — Ambulatory Visit: Payer: Medicare PPO | Attending: Neurosurgery | Admitting: Physical Therapy

## 2020-02-04 DIAGNOSIS — M542 Cervicalgia: Secondary | ICD-10-CM

## 2020-02-04 DIAGNOSIS — M6281 Muscle weakness (generalized): Secondary | ICD-10-CM

## 2020-02-04 NOTE — Patient Instructions (Signed)
Access Code: [email or web address] URL: [email or web address] Date: 02/04/2020 Prepared by: Hilda Blades  Exercises Seated Cervical Retraction - 2-3 x daily - 7 x weekly - 10 reps - 3 seconds hold Seated Cervical Sidebending Stretch - 2-3 x daily - 7 x weekly - 2 reps - 15 seconds hold Gentle Levator Scapulae Stretch - 2-3 x daily - 7 x weekly - 2 reps - 15 seconds hold Standing Row with Anchored Resistance - 2 x daily - 7 x weekly - 2 sets - 10 reps Scapular Retraction with Resistance Advanced - 2 x daily - 7 x weekly - 2 sets - 10 reps

## 2020-02-04 NOTE — Therapy (Signed)
[place], [place], [address] Phone: [phone]   Fax:  [phone]  Physical Therapy Evaluation  Patient Details  Name: Stacie Marquez MRN: [id number] Date of Birth: 10/08/1950 Referring Provider (PT): Newman Pies, MD   Encounter Date: 02/04/2020   PT End of Session - 02/04/20 1420    Visit Number 1    Number of Visits 8    Date for PT Re-Evaluation 03/31/20    Authorization Type [employer]ICARE    Authorization Time Period KX at 15th visit, FOTO due at 6th visit    Progress Note Due on Visit 10    PT Start Time 1425    PT Stop Time 1510    PT Time Calculation (min) 45 min    Activity Tolerance Patient tolerated treatment well    Behavior During Therapy [facility] for tasks assessed/performed           Past Medical History:  Diagnosis Date  . Ankle swelling    bilateral  . Anxiety   . Diabetes mellitus without complication ([place])   . Hypertension   . Leg swelling    bilateral  . Plantar fasciitis    left foot  . Pre-diabetes   . Rosacea     Past Surgical History:  Procedure Laterality Date  . ABDOMINAL HYSTERECTOMY    . ACHILLES TENDON SURGERY    . CESAREAN SECTION    . CHOLECYSTECTOMY  07/27/2012   Procedure: LAPAROSCOPIC CHOLECYSTECTOMY WITH INTRAOPERATIVE CHOLANGIOGRAM;  Surgeon: Leighton Ruff, MD;  Location: [facility];  Service: General;  Laterality: N/A;  . COLONOSCOPY    . DILATATION & CURETTAGE/HYSTEROSCOPY WITH MYOSURE N/A 01/25/2017   Procedure: DILATATION & CURETTAGE/HYSTEROSCOPY WITH MYOSURE;  Surgeon: Olga Millers, MD;  Location: [facility];  Service: Gynecology;  Laterality: N/A;  . FRACTURE SURGERY     right arm fracture and shoulder disclocation  . ROBOTIC ASSISTED TOTAL HYSTERECTOMY WITH BILATERAL SALPINGO OOPHERECTOMY N/A 02/15/2017   Procedure: XI ROBOTIC ASSISTED TOTAL HYSTERECTOMY WITH BILATERAL SALPINGO OOPHORECTOMY;  Surgeon: Everitt Amber, MD;  Location: [facility];   Service: Gynecology;  Laterality: N/A;  . SENTINEL NODE BIOPSY N/A 02/15/2017   Procedure: SENTINEL NODE BIOPSY AND LEFT THIGH BIOPSY;  Surgeon: Everitt Amber, MD;  Location: [facility];  Service: Gynecology;  Laterality: N/A;    There were no vitals filed for this visit.    Subjective Assessment - 02/04/20 1419    Subjective Patient reports neck pain, she went to the ER and had a CT scan that showed a pinched nerve and arthritis. She feels this started back in April 20th 2021, she was sitting in the passenger seat of the car and turned to the left and when she turned and reached back she felt pins and needles sensation go down her neck and shoulders, down both her arms. Currently she feels she is improved, she does feel a popping sensation with certain movements, and she has trouble looking toward the left.    Pertinent History Left shoulder fx and dislocation 05/2015    Limitations House hold activities    How long can you sit comfortably? No limitation    How long can you stand comfortably? No limitation    How long can you walk comfortably? No limitation    Diagnostic tests CT scan    Patient Stated Goals Improve neck pain so she can move her neck without limitation    Currently in Pain? Yes    Pain Score 3  Pain Location Neck    Pain Orientation Right;Left    Pain Descriptors / Indicators Burning;Tightness;Pins and needles   "Strain"   Pain Type Chronic pain    Pain Radiating Towards left shoulder, has not had arm symptoms recently    Pain Onset More than a month ago    Pain Frequency Intermittent    Aggravating Factors  Turning head to the left, bending neck forward    Pain Relieving Factors Rest, warm-shoulder, Tylenol    Effect of Pain on Daily Activities Limited with activities involving neck movement              [facility] PT Assessment - 02/04/20 0001      Assessment   Medical Diagnosis Cervicalgia    Referring Provider (PT) Newman Pies, MD    Onset Date/Surgical Date  12/11/19    Hand Dominance Left    Next MD Visit 04/2020    Prior Therapy Yes - left shoulder      Precautions   Precautions None      Restrictions   Weight Bearing Restrictions No      Balance Screen   Has the patient fallen in the past 6 months No    Has the patient had a decrease in activity level because of a fear of falling?  No    Is the patient reluctant to leave their home because of a fear of falling?  No      Home Ecologist residence    Living Arrangements Spouse/significant other      Prior Function   Level of [place] Retired    Leisure None reported      Cognition   Overall Cognitive Status Within Functional Limits for tasks assessed      Observation/Other Assessments   Observations Patient appears in no apparent distress    Focus on Therapeutic Outcomes (FOTO)  47% limitation      Sensation   Light Touch Appears Intact      Posture/Postural Control   Posture Comments Patient exhibits rounded shoulder and forward head posture      ROM / Strength   AROM / PROM / Strength AROM;Strength      AROM   Overall AROM Comments Increased left sided neck/upper shoulder pain with left rotation    AROM Assessment Site Cervical    Cervical Flexion 30    Cervical Extension 35    Cervical - Right Side Bend 25    Cervical - Left Side Bend 20    Cervical - Right Rotation 60    Cervical - Left Rotation 50      Strength   Overall Strength Comments Periscapular strength grossly 4-/5 MMT bilaterally    Strength Assessment Site Shoulder    Right/Left Shoulder Right;Left    Right Shoulder Flexion 4+/5    Right Shoulder Extension 4/5    Right Shoulder ABduction 4+/5    Right Shoulder Internal Rotation 5/5    Right Shoulder External Rotation 4+/5    Right Shoulder Horizontal ABduction 4/5    Left Shoulder Flexion 4/5    Left Shoulder Extension 4/5    Left Shoulder ABduction 4+/5    Left Shoulder Internal  Rotation 5/5    Left Shoulder External Rotation 4+/5    Left Shoulder Horizontal ABduction 4/5      Flexibility   Soft Tissue Assessment /Muscle Length yes   upper trap tightness bilaterally     Palpation  Palpation comment TTP to left upper trap and levator region, suboccipitals      Special Tests    Special Tests Cervical    Cervical Tests Spurling's      Spurling's   Findings Positive    Side Left      Transfers   Transfers Independent with all Transfers                      Objective measurements completed on examination: See above findings.       [facility] Adult PT Treatment/Exercise - 02/04/20 0001      Exercises   Exercises Neck      Neck Exercises: Theraband   Shoulder Extension 10 reps    Shoulder Extension Limitations yellow    Rows 10 reps    Rows Limitations yellow      Neck Exercises: Seated   Neck Retraction 10 reps;3 secs      Neck Exercises: Stretches   Upper Trapezius Stretch 2 reps;20 seconds    Levator Stretch 2 reps;20 seconds                  PT Education - 02/04/20 1420    Education Details Exam findings, POC, HEP, posture    Person(s) Educated Patient    Methods Explanation;Demonstration;Tactile cues;Verbal cues;Handout    Comprehension Verbalized understanding;Returned demonstration;Verbal cues required;Tactile cues required;Need further instruction            PT Short Term Goals - 02/04/20 1421      PT SHORT TERM GOAL #1   Title Patient will be I with initial HEP to progress with PT    Time 4    Period Weeks    Status New    Target Date 03/03/20      PT SHORT TERM GOAL #2   Title Patient will be able to demonstrate proper seated posture to reduce neck tension    Time 4    Period Weeks    Status New    Target Date 03/03/20             PT Long Term Goals - 02/04/20 1422      PT LONG TERM GOAL #1   Title Patient will be I with final HEP to maintain progress with PT    Time 8    Period Weeks     Status New    Target Date 03/31/20      PT LONG TERM GOAL #2   Title Patient will demonstrate left cervical rotation AROM >/= 60 deg and non-painful to improve driving and dressing ability    Time 8    Period Weeks    Status New    Target Date 03/31/20      PT LONG TERM GOAL #3   Title Patient will demonstrated improved gross periscapular strength to >/= 4+/5 MMT to improve postural control and reduce neck tension    Time 8    Period Weeks    Status New    Target Date 03/31/20      PT LONG TERM GOAL #4   Title Patient will report improved functional level of </= 33% limitation on FOTO    Time 8    Period Weeks    Status New    Target Date 03/31/20                  Plan - 02/04/20 1420    Clinical Impression Statement Patient presents to PT with report  of chronic neck pain and tightness that mainly occurs with movements such as left cervical rotation. She does demonstrate radicular symptoms on the left but did not report any symptoms past her shoulder this visit. She exhibits limitations in cervical motion with muscular tightness, and pericapular weakness with postural deficits that are likely contributing to her symptoms. She would benefit from continued skilled PT to reduce radicular symptoms and improve cervical motion and postural control so she can reduce her neck and shoulder pain and return to previous level of function.    Personal Factors and Comorbidities Age;Fitness;Time since onset of injury/illness/exacerbation    Examination-Activity Limitations Reach Overhead;Dressing;Hygiene/Grooming    Examination-Participation Restrictions Driving;Cleaning;Laundry    Stability/Clinical Decision Making Stable/Uncomplicated    Clinical Decision Making Low    Rehab Potential Good    PT Frequency 1x / week    PT Duration 8 weeks    PT Treatment/Interventions ADLs/Self Care Home Management;Cryotherapy;Electrical Stimulation;Moist Heat;Traction;Neuromuscular  re-education;Therapeutic exercise;Therapeutic activities;Patient/family education;Manual techniques;Dry needling;Passive range of motion;Taping;Spinal Manipulations;Joint Manipulations    PT Next Visit Plan Assess HEP and progress PRN; manual/dry needling and stretching for bilat upper trap, levator scap, suboccipitals; assess neurodynamics and provided flossing exercises PRN; postural strengthening    PT Home Exercise Plan [id number]: seated chin tucks, upper trap and levator scap stretch, row and extension with yellow    Consulted and Agree with Plan of Care Patient           Patient will benefit from skilled therapeutic intervention in order to improve the following deficits and impairments:  Postural dysfunction, Decreased strength, Impaired flexibility, Decreased range of motion, Pain  Visit Diagnosis: Cervicalgia  Muscle weakness (generalized)     Problem List Patient Active Problem List   Diagnosis Date Noted  . Acute pulmonary embolism ([place]) 08/21/2017  . Cellulitis of abdominal wall 03/09/2017  . Diabetes mellitus without complication ([place]) 53/79/4327  . Morbid obesity ([place]) 02/15/2017  . Skin tag 02/02/2017  . Complex atypical endometrial hyperplasia 02/02/2017    Hilda Blades, PT, DPT, LAT, ATC 02/04/20  3:40 PM Phone: [phone] Fax: [facility] [address] [place], [place], [address] Phone: [phone]   Fax:  [phone]  Name: Akaisha Truman MRN: [id number] Date of Birth: Feb 03, 1951

## 2020-02-14 ENCOUNTER — Ambulatory Visit: Payer: Medicare PPO | Admitting: Physical Therapy

## 2020-02-14 ENCOUNTER — Encounter: Payer: Self-pay | Admitting: Physical Therapy

## 2020-02-14 ENCOUNTER — Other Ambulatory Visit: Payer: Self-pay

## 2020-02-14 DIAGNOSIS — M542 Cervicalgia: Secondary | ICD-10-CM

## 2020-02-14 DIAGNOSIS — M6281 Muscle weakness (generalized): Secondary | ICD-10-CM

## 2020-02-14 NOTE — Therapy (Signed)
[place], [place], [address] Phone: [phone]   Fax:  [phone]  Physical Therapy Treatment  Patient Details  Name: Stacie Marquez MRN: [id number] Date of Birth: July 15, 1951 Referring Provider (PT): Newman Pies, MD   Encounter Date: 02/14/2020   PT End of Session - 02/14/20 1448    Visit Number 2    Number of Visits 8    Date for PT Re-Evaluation 03/31/20    Authorization Type [employer] MEDICARE    Authorization Time Period KX at 15th visit, FOTO due at 6th visit    Progress Note Due on Visit 10    PT Start Time 1445    PT Stop Time 1525    PT Time Calculation (min) 40 min    Activity Tolerance Patient tolerated treatment well    Behavior During Therapy [facility] for tasks assessed/performed           Past Medical History:  Diagnosis Date  . Ankle swelling    bilateral  . Anxiety   . Diabetes mellitus without complication ([place])   . Hypertension   . Leg swelling    bilateral  . Plantar fasciitis    left foot  . Pre-diabetes   . Rosacea     Past Surgical History:  Procedure Laterality Date  . ABDOMINAL HYSTERECTOMY    . ACHILLES TENDON SURGERY    . CESAREAN SECTION    . CHOLECYSTECTOMY  07/27/2012   Procedure: LAPAROSCOPIC CHOLECYSTECTOMY WITH INTRAOPERATIVE CHOLANGIOGRAM;  Surgeon: Leighton Ruff, MD;  Location: [facility];  Service: General;  Laterality: N/A;  . COLONOSCOPY    . DILATATION & CURETTAGE/HYSTEROSCOPY WITH MYOSURE N/A 01/25/2017   Procedure: DILATATION & CURETTAGE/HYSTEROSCOPY WITH MYOSURE;  Surgeon: Olga Millers, MD;  Location: [facility];  Service: Gynecology;  Laterality: N/A;  . FRACTURE SURGERY     right arm fracture and shoulder disclocation  . ROBOTIC ASSISTED TOTAL HYSTERECTOMY WITH BILATERAL SALPINGO OOPHERECTOMY N/A 02/15/2017   Procedure: XI ROBOTIC ASSISTED TOTAL HYSTERECTOMY WITH BILATERAL SALPINGO OOPHORECTOMY;  Surgeon: Everitt Amber, MD;  Location: [facility];   Service: Gynecology;  Laterality: N/A;  . SENTINEL NODE BIOPSY N/A 02/15/2017   Procedure: SENTINEL NODE BIOPSY AND LEFT THIGH BIOPSY;  Surgeon: Everitt Amber, MD;  Location: [facility];  Service: Gynecology;  Laterality: N/A;    There were no vitals filed for this visit.   Subjective Assessment - 02/14/20 1447    Subjective Patient reports she is doing well. States that she hasn't had any pain that tylenol can't take of and has not had any arm tingling.    Patient Stated Goals Improve neck pain so she can move her neck without limitation    Currently in Pain? No/denies    Pain Location Neck    Pain Orientation Right;Left    Pain Type Chronic pain    Pain Onset More than a month ago    Pain Frequency Intermittent              [facility] PT Assessment - 02/14/20 0001      AROM   Cervical - Right Side Bend 30    Cervical - Left Side Bend 30                         [facility] Adult PT Treatment/Exercise - 02/14/20 0001      Exercises   Exercises Neck      Neck Exercises: Machines for Strengthening   UBE (  Upper Arm Bike) L1 x 4 min ( 2 fwd/bwd)      Neck Exercises: Theraband   Shoulder Extension 10 reps   2 sets   Shoulder Extension Limitations yellow    Rows 10 reps   2 sets   Rows Limitations yellow      Neck Exercises: Seated   Neck Retraction 10 reps;3 secs    Shoulder Rolls 10 reps    Shoulder Rolls Limitations posterior      Manual Therapy   Manual Therapy Soft tissue mobilization;Passive ROM;Manual Traction;Myofascial release    Soft tissue mobilization Left upper trap/levator scap region, suboccipital release    Myofascial Release Left upper trap/levator scap region    Passive ROM Upper trap stretches in supine    Manual Traction In combination with       Neck Exercises: Stretches   Upper Trapezius Stretch 2 reps;20 seconds    Levator Stretch 2 reps;20 seconds    Other Neck Stretches Standing thoracic extension/lat stretch with ball on table x 5 with 5 sec  hold                  PT Education - 02/14/20 1448    Education Details HEP    Person(s) Educated Patient    Methods Explanation;Demonstration;Tactile cues;Verbal cues;Handout    Comprehension Verbalized understanding;Returned demonstration;Verbal cues required;Tactile cues required;Need further instruction            PT Short Term Goals - 02/04/20 1421      PT SHORT TERM GOAL #1   Title Patient will be I with initial HEP to progress with PT    Time 4    Period Weeks    Status New    Target Date 03/03/20      PT SHORT TERM GOAL #2   Title Patient will be able to demonstrate proper seated posture to reduce neck tension    Time 4    Period Weeks    Status New    Target Date 03/03/20             PT Long Term Goals - 02/04/20 1422      PT LONG TERM GOAL #1   Title Patient will be I with final HEP to maintain progress with PT    Time 8    Period Weeks    Status New    Target Date 03/31/20      PT LONG TERM GOAL #2   Title Patient will demonstrate left cervical rotation AROM >/= 60 deg and non-painful to improve driving and dressing ability    Time 8    Period Weeks    Status New    Target Date 03/31/20      PT LONG TERM GOAL #3   Title Patient will demonstrated improved gross periscapular strength to >/= 4+/5 MMT to improve postural control and reduce neck tension    Time 8    Period Weeks    Status New    Target Date 03/31/20      PT LONG TERM GOAL #4   Title Patient will report improved functional level of </= 33% limitation on FOTO    Time 8    Period Weeks    Status New    Target Date 03/31/20                 Plan - 02/14/20 1449    Clinical Impression Statement Patient tolerated therapy well with no adverse effects. Manual therapy to improve  neck flexibility and posture, focused on left side since patient reports greater tightness/tenderness on left. Continued stretching and postural strengthening with good tolerance. No change to HEP  this visit. She would benefit from continued skilled PT to reduce radicular symptoms and improve cervical motion and postural control so she can reduce her neck and shoulder pain and return to previous level of function.    PT Treatment/Interventions ADLs/Self Care Home Management;Cryotherapy;Electrical Stimulation;Moist Heat;Traction;Neuromuscular re-education;Therapeutic exercise;Therapeutic activities;Patient/family education;Manual techniques;Dry needling;Passive range of motion;Taping;Spinal Manipulations;Joint Manipulations    PT Next Visit Plan Assess HEP and progress PRN; manual/dry needling and stretching for bilat upper trap, levator scap, suboccipitals; assess neurodynamics and provided flossing exercises PRN; postural strengthening    PT Home Exercise Plan [id number]: seated chin tucks, upper trap and levator scap stretch for left, row and extension with yellow    Consulted and Agree with Plan of Care Patient           Patient will benefit from skilled therapeutic intervention in order to improve the following deficits and impairments:  Postural dysfunction, Decreased strength, Impaired flexibility, Decreased range of motion, Pain  Visit Diagnosis: Cervicalgia  Muscle weakness (generalized)     Problem List Patient Active Problem List   Diagnosis Date Noted  . Acute pulmonary embolism ([place]) 08/21/2017  . Cellulitis of abdominal wall 03/09/2017  . Diabetes mellitus without complication ([place]) 00/34/9179  . Morbid obesity ([place]) 02/15/2017  . Skin tag 02/02/2017  . Complex atypical endometrial hyperplasia 02/02/2017    Hilda Blades, PT, DPT, LAT, ATC 02/14/20  3:30 PM Phone: [phone] Fax: [facility] [address] [place], [place], [address] Phone: [phone]   Fax:  [phone]  Name: Keyauna Graefe MRN: [id number] Date of Birth: [birth year]

## 2020-02-18 ENCOUNTER — Ambulatory Visit: Payer: Medicare PPO | Admitting: Physical Therapy

## 2020-02-18 ENCOUNTER — Encounter: Payer: Self-pay | Admitting: Physical Therapy

## 2020-02-18 ENCOUNTER — Other Ambulatory Visit: Payer: Self-pay

## 2020-02-18 DIAGNOSIS — M542 Cervicalgia: Secondary | ICD-10-CM

## 2020-02-18 DIAGNOSIS — M6281 Muscle weakness (generalized): Secondary | ICD-10-CM

## 2020-02-18 NOTE — Therapy (Signed)
[name], [place], [address] Phone: [phone]   Fax:  [phone]  Physical Therapy Treatment  Patient Details  Name: Stacie Marquez MRN: [id number] Date of Birth: 09-Apr-1951 Referring Provider (PT): Newman Pies, MD   Encounter Date: 02/18/2020   PT End of Session - 02/18/20 1454    Visit Number 3    Number of Visits 8    Date for PT Re-Evaluation 03/31/20    Authorization Type [employer]ICARE    Authorization Time Period KX at 15th visit, FOTO due at 6th visit    Progress Note Due on Visit 10    PT Start Time 1445    PT Stop Time 1523    PT Time Calculation (min) 38 min    Activity Tolerance Patient tolerated treatment well    Behavior During Therapy [facility] for tasks assessed/performed           Past Medical History:  Diagnosis Date  . Ankle swelling    bilateral  . Anxiety   . Diabetes mellitus without complication ([place])   . Hypertension   . Leg swelling    bilateral  . Plantar fasciitis    left foot  . Pre-diabetes   . Rosacea     Past Surgical History:  Procedure Laterality Date  . ABDOMINAL HYSTERECTOMY    . ACHILLES TENDON SURGERY    . CESAREAN SECTION    . CHOLECYSTECTOMY  07/27/2012   Procedure: LAPAROSCOPIC CHOLECYSTECTOMY WITH INTRAOPERATIVE CHOLANGIOGRAM;  Surgeon: Leighton Ruff, MD;  Location: [facility];  Service: General;  Laterality: N/A;  . COLONOSCOPY    . DILATATION & CURETTAGE/HYSTEROSCOPY WITH MYOSURE N/A 01/25/2017   Procedure: DILATATION & CURETTAGE/HYSTEROSCOPY WITH MYOSURE;  Surgeon: Olga Millers, MD;  Location: [facility];  Service: Gynecology;  Laterality: N/A;  . FRACTURE SURGERY     right arm fracture and shoulder disclocation  . ROBOTIC ASSISTED TOTAL HYSTERECTOMY WITH BILATERAL SALPINGO OOPHERECTOMY N/A 02/15/2017   Procedure: XI ROBOTIC ASSISTED TOTAL HYSTERECTOMY WITH BILATERAL SALPINGO OOPHORECTOMY;  Surgeon: Everitt Amber, MD;  Location: [facility];   Service: Gynecology;  Laterality: N/A;  . SENTINEL NODE BIOPSY N/A 02/15/2017   Procedure: SENTINEL NODE BIOPSY AND LEFT THIGH BIOPSY;  Surgeon: Everitt Amber, MD;  Location: [facility];  Service: Gynecology;  Laterality: N/A;    There were no vitals filed for this visit.   Subjective Assessment - 02/18/20 1451    Subjective Patient reports she was very sore the day after last session and she wasn't able to do any of her exercises. She has been able to do some of her exercises since then.    Patient Stated Goals Improve neck pain so she can move her neck without limitation    Currently in Pain? No/denies    Pain Score 0-No pain    Pain Location Neck    Pain Orientation Left    Pain Descriptors / Indicators Sore    Pain Type Chronic pain    Pain Onset More than a month ago    Pain Frequency Intermittent              [facility] PT Assessment - 02/18/20 0001      AROM   Cervical - Right Side Bend 30    Cervical - Left Side Bend 30                         [facility] Adult PT Treatment/Exercise - 02/18/20 0001  Exercises   Exercises Neck      Neck Exercises: Machines for Strengthening   UBE (Upper Arm Bike) L1 x 4 min ( 2 fwd/bwd)      Neck Exercises: Theraband   Shoulder Extension 15 reps   2 sets   Shoulder Extension Limitations yellow    Rows 15 reps;Red   2 sets   Shoulder External Rotation 10 reps   2 sets   Shoulder External Rotation Limitations yellow    Horizontal ABduction 5 reps   2 sets   Horizontal ABduction Limitations yellow      Neck Exercises: Seated   Neck Retraction 5 reps;3 secs   2 sets     Manual Therapy   Manual Therapy Myofascial release;Manual Traction    Myofascial Release Suboccipital release    Manual Traction In combination with suboccipital release      Neck Exercises: Stretches   Upper Trapezius Stretch 2 reps;20 seconds    Levator Stretch 2 reps;20 seconds                  PT Education - 02/18/20 1454    Education  Details HEP    Person(s) Educated Patient    Methods Explanation;Demonstration;Verbal cues    Comprehension Verbalized understanding;Returned demonstration;Verbal cues required;Need further instruction            PT Short Term Goals - 02/04/20 1421      PT SHORT TERM GOAL #1   Title Patient will be I with initial HEP to progress with PT    Time 4    Period Weeks    Status New    Target Date 03/03/20      PT SHORT TERM GOAL #2   Title Patient will be able to demonstrate proper seated posture to reduce neck tension    Time 4    Period Weeks    Status New    Target Date 03/03/20             PT Long Term Goals - 02/04/20 1422      PT LONG TERM GOAL #1   Title Patient will be I with final HEP to maintain progress with PT    Time 8    Period Weeks    Status New    Target Date 03/31/20      PT LONG TERM GOAL #2   Title Patient will demonstrate left cervical rotation AROM >/= 60 deg and non-painful to improve driving and dressing ability    Time 8    Period Weeks    Status New    Target Date 03/31/20      PT LONG TERM GOAL #3   Title Patient will demonstrated improved gross periscapular strength to >/= 4+/5 MMT to improve postural control and reduce neck tension    Time 8    Period Weeks    Status New    Target Date 03/31/20      PT LONG TERM GOAL #4   Title Patient will report improved functional level of </= 33% limitation on FOTO    Time 8    Period Weeks    Status New    Target Date 03/31/20                 Plan - 02/18/20 1455    Clinical Impression Statement Patient tolerated therapy well with no adverse effects. Less manual performed this visit as patient report increased soreness from this last visit. Periscapular/postural strengthening progressed with  good tolerance. She does require cueing to avoid shrugging shoulders and maintaining proper posture. No change to HEP was made this visit. She would benefit from continued skilled PT to improve  cervical motion and postural control so she can reduce her neck and shoulder pain and return to previous level of function.    PT Treatment/Interventions ADLs/Self Care Home Management;Cryotherapy;Electrical Stimulation;Moist Heat;Traction;Neuromuscular re-education;Therapeutic exercise;Therapeutic activities;Patient/family education;Manual techniques;Dry needling;Passive range of motion;Taping;Spinal Manipulations;Joint Manipulations    PT Next Visit Plan Assess HEP and progress PRN; manual/dry needling and stretching for bilat upper trap, levator scap, suboccipitals; assess neurodynamics and provided flossing exercises PRN; postural strengthening    PT Home Exercise Plan [id number]: seated chin tucks, upper trap and levator scap stretch for left, row and extension with yellow    Consulted and Agree with Plan of Care Patient           Patient will benefit from skilled therapeutic intervention in order to improve the following deficits and impairments:  Postural dysfunction, Decreased strength, Impaired flexibility, Decreased range of motion, Pain  Visit Diagnosis: Cervicalgia  Muscle weakness (generalized)     Problem List Patient Active Problem List   Diagnosis Date Noted  . Acute pulmonary embolism ([place]) 08/21/2017  . Cellulitis of abdominal wall 03/09/2017  . Diabetes mellitus without complication ([place]) 42/39/5320  . Morbid obesity ([place]) 02/15/2017  . Skin tag 02/02/2017  . Complex atypical endometrial hyperplasia 02/02/2017    Hilda Blades, PT, DPT, LAT, ATC 02/18/20  3:40 PM Phone: [phone] Fax: [facility] [address] [place], [place], [address] Phone: [phone]   Fax:  [phone]  Name: Stacie Marquez MRN: [id number] Date of Birth: [birth year]

## 2020-02-24 ENCOUNTER — Other Ambulatory Visit: Payer: Self-pay

## 2020-02-24 ENCOUNTER — Encounter (HOSPITAL_COMMUNITY): Payer: Self-pay | Admitting: Emergency Medicine

## 2020-02-24 ENCOUNTER — Emergency Department (HOSPITAL_COMMUNITY)
Admission: EM | Admit: 2020-02-24 | Discharge: 2020-02-24 | Disposition: A | Discharge status: Home / Self Care | Payer: Medicare PPO | Attending: Emergency Medicine | Admitting: Emergency Medicine

## 2020-02-24 DIAGNOSIS — E119 Type 2 diabetes mellitus without complications: Secondary | ICD-10-CM | POA: Insufficient documentation

## 2020-02-24 DIAGNOSIS — Z7984 Long term (current) use of oral hypoglycemic drugs: Secondary | ICD-10-CM | POA: Diagnosis not present

## 2020-02-24 DIAGNOSIS — Z7901 Long term (current) use of anticoagulants: Secondary | ICD-10-CM | POA: Diagnosis not present

## 2020-02-24 DIAGNOSIS — I10 Essential (primary) hypertension: Secondary | ICD-10-CM | POA: Diagnosis not present

## 2020-02-24 DIAGNOSIS — R531 Weakness: Secondary | ICD-10-CM | POA: Diagnosis not present

## 2020-02-24 LAB — URINALYSIS, ROUTINE W REFLEX MICROSCOPIC
Bilirubin Urine: NEGATIVE
Glucose, UA: NEGATIVE mg/dL
Hgb urine dipstick: NEGATIVE
Ketones, ur: NEGATIVE mg/dL
Leukocytes,Ua: NEGATIVE
Nitrite: NEGATIVE
Protein, ur: NEGATIVE mg/dL
Specific Gravity, Urine: 1.01 (ref 1.005–1.030)
pH: 7 (ref 5.0–8.0)

## 2020-02-24 LAB — CBC
HCT: 44.6 % (ref 36.0–46.0)
Hemoglobin: 14.8 g/dL (ref 12.0–15.0)
MCH: 30.8 pg (ref 26.0–34.0)
MCHC: 33.2 g/dL (ref 30.0–36.0)
MCV: 92.7 fL (ref 80.0–100.0)
Platelets: 234 10*3/uL (ref 150–400)
RBC: 4.81 MIL/uL (ref 3.87–5.11)
RDW: 13.8 % (ref 11.5–15.5)
WBC: 6.4 10*3/uL (ref 4.0–10.5)
nRBC: 0 % (ref 0.0–0.2)

## 2020-02-24 LAB — BASIC METABOLIC PANEL
Anion gap: 12 (ref 5–15)
BUN: 14 mg/dL (ref 8–23)
CO2: 22 mmol/L (ref 22–32)
Calcium: 9.4 mg/dL (ref 8.9–10.3)
Chloride: 108 mmol/L (ref 98–111)
Creatinine, Ser: 0.97 mg/dL (ref 0.44–1.00)
GFR calc Af Amer: >60 mL/min (ref >60)
GFR calc non Af Amer: 59 mL/min — ABNORMAL LOW (ref >60)
Glucose, Bld: 125 mg/dL — ABNORMAL HIGH (ref 70–99)
Potassium: 3.9 mmol/L (ref 3.5–5.1)
Sodium: 142 mmol/L (ref 135–145)

## 2020-02-24 LAB — CBG MONITORING, ED: Glucose-Capillary: 130 mg/dL — ABNORMAL HIGH (ref 70–99)

## 2020-02-24 MED ORDER — SODIUM CHLORIDE 0.9% FLUSH: 3.0000 mL | Freq: Once | INTRAVENOUS | Status: DC

## 2020-02-24 MED ORDER — SODIUM CHLORIDE 0.9 % IV BOLUS: 1000.0000 mL | Freq: Once | INTRAVENOUS | Status: DC

## 2020-02-24 NOTE — ED Notes (Signed)
Patient verbalizes understanding of discharge instructions. Opportunity for questioning and answers were provided. Armband removed by staff, pt discharged from ED ambulatory to home.  

## 2020-02-24 NOTE — ED Provider Notes (Signed)
[facility] EMERGENCY DEPARTMENT Provider Note   CSN: [id number] Arrival date & time: 02/24/20  1143     History Chief Complaint  Patient presents with   Weakness    Stacie Schaff is a 69 y.o. female with a past medical history of diabetes, hypertension, PE current on anticoagulation presenting to the ED with a chief complaint of generalized weakness and urinary frequency.  States for the past week she reports generalized weakness and fatigue, low energy.  She states that she has been urinating more frequently and is concerned that she may have a UTI.  She reports diarrhea every morning for the past 3 to 4 days.  She denies any dizziness, lightheadedness, headache.  Only complaint of pain is pain throughout her neck, which has been chronic for her for the past 2 months, she was told that she had a "pinched nerve in my neck and have been doing physical therapy for it."  She denies any abdominal pain shortness of breath chest pain, fever, back pain, injuries or falls, numbness in arms or legs or extremity weakness. She states she has chronic bilateral lower extremity edema which worsens after sitting for long periods of time.  HPI     Past Medical History:  Diagnosis Date   Ankle swelling    bilateral   Anxiety    Diabetes mellitus without complication ([facility])    Hypertension    Leg swelling    bilateral   Plantar fasciitis    left foot   Pre-diabetes    Rosacea     Patient Active Problem List   Diagnosis Date Noted   Acute pulmonary embolism ([place]) 08/21/2017   Cellulitis of abdominal wall 03/09/2017   Diabetes mellitus without complication ([place]) 01/60/1093   Morbid obesity ([place]) 02/15/2017   Skin tag 02/02/2017   Complex atypical endometrial hyperplasia 02/02/2017    Past Surgical History:  Procedure Laterality Date   ABDOMINAL HYSTERECTOMY     ACHILLES TENDON SURGERY     CESAREAN SECTION     CHOLECYSTECTOMY  07/27/2012    Procedure: LAPAROSCOPIC CHOLECYSTECTOMY WITH INTRAOPERATIVE CHOLANGIOGRAM;  Surgeon: Leighton Ruff, MD;  Location: [facility];  Service: General;  Laterality: N/A;   COLONOSCOPY     [place] N/A 01/25/2017   Procedure: DILATATION & CURETTAGE/HYSTEROSCOPY WITH MYOSURE;  Surgeon: Olga Millers, MD;  Location: [facility];  Service: Gynecology;  Laterality: N/A;   FRACTURE SURGERY     right arm fracture and shoulder disclocation   ROBOTIC ASSISTED TOTAL HYSTERECTOMY WITH BILATERAL SALPINGO OOPHERECTOMY N/A 02/15/2017   Procedure: XI ROBOTIC ASSISTED TOTAL HYSTERECTOMY WITH BILATERAL SALPINGO OOPHORECTOMY;  Surgeon: Everitt Amber, MD;  Location: [facility];  Service: Gynecology;  Laterality: N/A;   SENTINEL NODE BIOPSY N/A 02/15/2017   Procedure: SENTINEL NODE BIOPSY AND LEFT THIGH BIOPSY;  Surgeon: Everitt Amber, MD;  Location: [facility];  Service: Gynecology;  Laterality: N/A;     OB History   No obstetric history on file.     No family history on file.  Social History   Tobacco Use   Smoking status: Never Smoker   Smokeless tobacco: Never Used  Vaping Use   Vaping Use: Never used  Substance Use Topics   Alcohol use: No   Drug use: No    Home Medications Prior to Admission medications   Medication Sig Start Date End Date Taking? Authorizing Provider  acetaminophen (TYLENOL) 500 MG tablet Take 500 mg by mouth every 6 (six) hours  as needed for mild pain or moderate pain.   Yes [provider]  amiodarone (PACERONE) 100 MG tablet Take 100 mg by mouth daily.   Yes [provider]  atorvastatin (LIPITOR) 20 MG tablet Take 20 mg by mouth daily.   Yes [provider]  busPIRone (BUSPAR) 10 MG tablet Take 10 mg by mouth 2 (two) times daily. 02/24/20  Yes [provider]  ELIQUIS 5 MG TABS tablet Take 5 mg by mouth 2 (two) times daily. 02/15/20  Yes [provider]  losartan (COZAAR) 100 MG tablet Take 100 mg by mouth  daily. 12/21/19  Yes [provider]  metFORMIN (GLUCOPHAGE) 500 MG tablet Take 500 mg by mouth at bedtime.  01/30/20  Yes [provider]  Multiple Vitamin (MULTIVITAMIN) tablet Take 1 tablet by mouth daily.   Yes [provider]  Multiple Vitamins-Minerals (ZINC PO) Take 1 tablet by mouth daily.   Yes [provider]  nitroGLYCERIN (NITROSTAT) 0.4 MG SL tablet Place 0.4 mg under the tongue every 5 (five) minutes as needed for chest pain.   Yes [provider]  nystatin (NYSTATIN) powder Apply 1 application topically daily as needed for irritation.   Yes [provider]  triamcinolone cream (KENALOG) 0.1 % Apply 1 application topically daily as needed (irritaion). Apply topically under breasts   Yes [provider]  HYDROcodone-acetaminophen (NORCO/VICODIN) 5-325 MG tablet Take 1 tablet by mouth every 6 (six) hours as needed. Patient not taking: Reported on 02/24/2020 01/03/20   Milton Ferguson, MD  nitrofurantoin, macrocrystal-monohydrate, (MACROBID) 100 MG capsule Take 1 capsule (100 mg total) by mouth 2 (two) times daily. Patient not taking: Reported on 02/24/2020 02/24/18   Carlisle Cater, PA-C  warfarin (COUMADIN) 10 MG tablet Take 1 tablet (10 mg total) by mouth daily at 6 PM. Patient not taking: Reported on 02/24/2020 08/29/17   Charolette Forward, MD    Allergies    Penicillins  Review of Systems   Review of Systems  Constitutional: Positive for fatigue. Negative for appetite change, chills and fever.  HENT: Negative for ear pain, rhinorrhea, sneezing and sore throat.   Eyes: Negative for photophobia and visual disturbance.  Respiratory: Negative for cough, chest tightness, shortness of breath and wheezing.   Cardiovascular: Negative for chest pain and palpitations.  Gastrointestinal: Negative for abdominal pain, blood in stool, constipation, diarrhea, nausea and vomiting.  Genitourinary: Positive for frequency. Negative for dysuria,  hematuria and urgency.  Musculoskeletal: Negative for myalgias.  Skin: Negative for rash.  Neurological: Negative for dizziness, weakness and light-headedness.    Physical Exam Updated Vital Signs BP (!) 144/77 (BP Location: Left Arm)    Pulse 65    Temp 98.7 F (37.1 C) (Oral)    Resp 16    LMP 08/21/2017    SpO2 97%   Physical Exam Vitals and nursing note reviewed.  Constitutional:      General: She is not in acute distress.    Appearance: She is well-developed.  HENT:     Head: Normocephalic and atraumatic.     Nose: Nose normal.  Eyes:     General: No scleral icterus.       Right eye: No discharge.        Left eye: No discharge.     Conjunctiva/sclera: Conjunctivae normal.     Pupils: Pupils are equal, round, and reactive to light.  Cardiovascular:     Rate and Rhythm: Normal rate and regular rhythm.  Heart sounds: Normal heart sounds. No murmur heard.  No friction rub. No gallop.   Pulmonary:     Effort: Pulmonary effort is normal. No respiratory distress.     Breath sounds: Normal breath sounds.  Abdominal:     General: Bowel sounds are normal. There is no distension.     Palpations: Abdomen is soft.     Tenderness: There is no abdominal tenderness. There is no guarding.  Musculoskeletal:        General: Normal range of motion.     Cervical back: Normal range of motion and neck supple.     Right lower leg: Edema present.     Left lower leg: Edema present.     Comments: Nonpitting edema to bilateral lower extremities.  No calf tenderness, erythema noted.  Skin:    General: Skin is warm and dry.     Findings: No rash.  Neurological:     General: No focal deficit present.     Mental Status: She is alert and oriented to person, place, and time.     Cranial Nerves: No cranial nerve deficit.     Sensory: No sensory deficit.     Motor: No weakness or abnormal muscle tone.     Coordination: Coordination normal.     Comments: Pupils reactive. No facial asymmetry  noted. Cranial nerves appear grossly intact. Sensation intact to light touch on face, BUE and BLE. Strength 5/5 in BUE and BLE.     ED Results / Procedures / Treatments   Labs (all labs ordered are listed, but only abnormal results are displayed) Labs Reviewed  BASIC METABOLIC PANEL - Abnormal; Notable for the following components:      Result Value   Glucose, Bld 125 (*)    GFR calc non Af Amer 59 (*)    All other components within normal limits  URINALYSIS, ROUTINE W REFLEX MICROSCOPIC - Abnormal; Notable for the following components:   Color, Urine STRAW (*)    All other components within normal limits  CBG MONITORING, ED - Abnormal; Notable for the following components:   Glucose-Capillary 130 (*)    All other components within normal limits  URINE CULTURE  CBC    EKG EKG Interpretation  Date/Time:  Sunday February 24 2020 12:05:17 EDT Ventricular Rate:  65 PR Interval:  190 QRS Duration: 84 QT Interval:  462 QTC Calculation: 480 R Axis:   56 Text Interpretation: Normal sinus rhythm Low voltage QRS Borderline ECG Since last tracing Rate faster Confirmed by Sheldon, Charles ([id number]) on 02/24/2020 12:09:35 PM   Radiology No results found.  Procedures Procedures (including critical care time)  Medications Ordered in ED Medications  sodium chloride flush (NS) 0.9 % injection 3 mL (has no administration in time range)  sodium chloride 0.9 % bolus 1,000 mL (has no administration in time range)    ED Course  I have reviewed the triage vital signs and the nursing notes.  Pertinent labs & imaging results that were available during my care of the patient were reviewed by me and considered in my medical decision making (see chart for details).    MDM Rules/Calculators/A&P                          68  year old female with a past medical history of diabetes, hypertension, PE currently on Eliquis presenting to the ED with a chief complaint of generalized weakness and urinary  frequency.  Denies any focal weakness  or numbness, headache, chest pain, shortness of breath, abdominal pain or vomiting.  Reports some diarrhea every morning but states that this has been chronic for her.  On exam patient is overall well-appearing.  No signs of respiratory distress.  No neurological deficits noted.  She is hemodynamically stable.  EKG here shows sinus rhythm, no changes from prior tracings.  Urinalysis without signs of infection or other abnormalities.  CBC and BMP unremarkable.  Urine sent for culture.  Low likelihood of cardiac or neurological cause of her symptoms as her work-up is reassuring, no abnormalities noted on physical exam.  Question whether this could be viral versus due to vitamin deficiency versus due to her thyroid.  She did have a full work-up by her PCP at her yearly physical about 3 months ago.  We will have her follow-up with her PCP regarding any further work-up.  No evidence of ACS.  No weakness or numbness noted concerning for CVA.  Patient is agreeable to the plan.  We will have her return for worsening symptoms. Patient discussed with and seen by Dr. Tyrone Nine.   Patient is hemodynamically stable, in NAD, and able to ambulate in the ED. Evaluation does not show pathology that would require ongoing emergent intervention or inpatient treatment. I explained the diagnosis to the patient. Pain has been managed and has no complaints prior to discharge. Patient is comfortable with above plan and is stable for discharge at this time. All questions were answered prior to disposition. Strict return precautions for returning to the ED were discussed. Encouraged follow up with PCP.   An After Visit Summary was printed and given to the patient.   Portions of this note were generated with [employer]. Dictation errors may occur despite best attempts at proofreading.  Final Clinical Impression(s) / ED Diagnoses Final diagnoses:  Generalized weakness    Rx / DC  Orders ED Discharge Orders    None       Delia Heady, PA-C 02/24/20 [place], [place], DO 02/24/20 1507

## 2020-02-24 NOTE — ED Triage Notes (Signed)
C/o generalized weakness and frequent urination x 1 week.  Denies pain.

## 2020-02-24 NOTE — Discharge Instructions (Signed)
Follow-up with your primary care provider. Continue your home medications as previously prescribed. Return to the ER for worsening weakness, numbness in arms or legs, headache, chest pain, shortness of breath, injuries or falls.

## 2020-02-25 LAB — URINE CULTURE: Culture: NO GROWTH

## 2020-02-27 ENCOUNTER — Ambulatory Visit: Payer: Medicare PPO | Attending: Neurosurgery | Admitting: Physical Therapy

## 2020-02-27 ENCOUNTER — Encounter: Payer: Self-pay | Admitting: Physical Therapy

## 2020-02-27 ENCOUNTER — Other Ambulatory Visit: Payer: Self-pay

## 2020-02-27 DIAGNOSIS — M6281 Muscle weakness (generalized): Secondary | ICD-10-CM | POA: Insufficient documentation

## 2020-02-27 DIAGNOSIS — M542 Cervicalgia: Secondary | ICD-10-CM | POA: Insufficient documentation

## 2020-02-27 NOTE — Therapy (Signed)
[name], [place], [address] Phone: [phone]   Fax:  [phone]  Physical Therapy Treatment  Patient Details  Name: Stacie Marquez MRN: [id number] Date of Birth: 07/03/1951 Referring Provider (PT): Newman Pies, MD   Encounter Date: 02/27/2020   PT End of Session - 02/27/20 1356    Visit Number 4    Number of Visits 8    Date for PT Re-Evaluation 03/31/20    Authorization Type [employer] MEDICARE    Authorization Time Period KX at 15th visit, FOTO due at 6th visit    Progress Note Due on Visit 10    PT Start Time 1358    PT Stop Time 1440    PT Time Calculation (min) 42 min    Activity Tolerance Patient tolerated treatment well    Behavior During Therapy [facility] for tasks assessed/performed           Past Medical History:  Diagnosis Date  . Ankle swelling    bilateral  . Anxiety   . Diabetes mellitus without complication ([place])   . Hypertension   . Leg swelling    bilateral  . Plantar fasciitis    left foot  . Pre-diabetes   . Rosacea     Past Surgical History:  Procedure Laterality Date  . ABDOMINAL HYSTERECTOMY    . ACHILLES TENDON SURGERY    . CESAREAN SECTION    . CHOLECYSTECTOMY  07/27/2012   Procedure: LAPAROSCOPIC CHOLECYSTECTOMY WITH INTRAOPERATIVE CHOLANGIOGRAM;  Surgeon: Leighton Ruff, MD;  Location: [place];  Service: General;  Laterality: N/A;  . COLONOSCOPY    . DILATATION & CURETTAGE/HYSTEROSCOPY WITH MYOSURE N/A 01/25/2017   Procedure: DILATATION & CURETTAGE/HYSTEROSCOPY WITH MYOSURE;  Surgeon: Olga Millers, MD;  Location: [facility];  Service: Gynecology;  Laterality: N/A;  . FRACTURE SURGERY     right arm fracture and shoulder disclocation  . ROBOTIC ASSISTED TOTAL HYSTERECTOMY WITH BILATERAL SALPINGO OOPHERECTOMY N/A 02/15/2017   Procedure: XI ROBOTIC ASSISTED TOTAL HYSTERECTOMY WITH BILATERAL SALPINGO OOPHORECTOMY;  Surgeon: Everitt Amber, MD;  Location[facility];   Service: Gynecology;  Laterality: N/A;  . SENTINEL NODE BIOPSY N/A 02/15/2017   Procedure: SENTINEL NODE BIOPSY AND LEFT THIGH BIOPSY;  Surgeon: Everitt Amber, MD;  Location[facility];  Service: Gynecology;  Laterality: N/A;    There were no vitals filed for this visit.   Subjective Assessment - 02/27/20 1355    Subjective Patient reports he has been doing pretty good. She did not feel as sore following last session. She feels she can turn her head to the left a little bit more.    Patient Stated Goals Improve neck pain so she can move her neck without limitation    Currently in Pain? No/denies              [facility] PT Assessment - 02/27/20 0001      AROM   Cervical - Right Side Bend 30    Cervical - Left Side Bend 30    Cervical - Right Rotation 60    Cervical - Left Rotation 55                         [facility] Adult PT Treatment/Exercise - 02/27/20 0001      Exercises   Exercises Neck      Neck Exercises: Machines for Strengthening   UBE (Upper Arm Bike) L1 x 4 min ( 2 fwd/bwd)  Neck Exercises: Theraband   Shoulder Extension 10 reps;Red   2 sets   Rows 10 reps;Green   2 sets   Shoulder External Rotation 10 reps   2 sets   Shoulder External Rotation Limitations yellow    Horizontal ABduction 10 reps   2 sets   Horizontal ABduction Limitations yellow - supine      Neck Exercises: Seated   Neck Retraction 10 reps;3 secs   2 sets     Manual Therapy   Manual Therapy Myofascial release;Manual Traction;Joint mobilization;Passive ROM    Joint Mobilization Lateral glide throughout cervical    Myofascial Release Suboccipital release    Passive ROM Upper trap stretches in supine    Manual Traction In combination with suboccipital release      Neck Exercises: Stretches   Upper Trapezius Stretch 2 reps;20 seconds    Levator Stretch 2 reps;20 seconds    Chest Stretch Limitations Trialed chest stretch in doorway but patient reported right shoulder discomfort                   PT Education - 02/27/20 1356    Education Details HEP, dry needling    Person(s) Educated Patient    Methods Explanation;Demonstration;Verbal cues;Handout    Comprehension Verbalized understanding;Returned demonstration;Verbal cues required;Need further instruction            PT Short Term Goals - 02/27/20 1441      PT SHORT TERM GOAL #1   Title Patient will be I with initial HEP to progress with PT    Time 4    Period Weeks    Status Achieved    Target Date 03/03/20      PT SHORT TERM GOAL #2   Title Patient will be able to demonstrate proper seated posture to reduce neck tension    Time 4    Period Weeks    Status Achieved    Target Date 03/03/20             PT Long Term Goals - 02/04/20 1422      PT LONG TERM GOAL #1   Title Patient will be I with final HEP to maintain progress with PT    Time 8    Period Weeks    Status New    Target Date 03/31/20      PT LONG TERM GOAL #2   Title Patient will demonstrate left cervical rotation AROM >/= 60 deg and non-painful to improve driving and dressing ability    Time 8    Period Weeks    Status New    Target Date 03/31/20      PT LONG TERM GOAL #3   Title Patient will demonstrated improved gross periscapular strength to >/= 4+/5 MMT to improve postural control and reduce neck tension    Time 8    Period Weeks    Status New    Target Date 03/31/20      PT LONG TERM GOAL #4   Title Patient will report improved functional level of </= 33% limitation on FOTO    Time 8    Period Weeks    Status New    Target Date 03/31/20                 Plan - 02/27/20 1357    Clinical Impression Statement Patient tolerated therapy well with no adverse effects. Continued with manual therapy and stretching with good tolerance and report of reduce tightness. She did  report right shoulder pain with doorway pec stretch that seemed more impingement related as she does exhbit limitation in left shoulder  mobility. She tolerated banded strengthening well with no report of pain, but she does note greater difficulty on left side which is likely due to left shoulder previous injury. She was given red band to progress exercises at home. She would benefit from continued skilled PT to improve cervical motion and postural control so she can reduce her neck and shoulder pain and return to previous level of function.    PT Treatment/Interventions ADLs/Self Care Home Management;Cryotherapy;Electrical Stimulation;Moist Heat;Traction;Neuromuscular re-education;Therapeutic exercise;Therapeutic activities;Patient/family education;Manual techniques;Dry needling;Passive range of motion;Taping;Spinal Manipulations;Joint Manipulations    PT Next Visit Plan Assess HEP and progress PRN; manual/dry needling and stretching for bilat upper trap, levator scap, suboccipitals; assess neurodynamics and provided flossing exercises PRN; postural strengthening    PT Home Exercise Plan [id number]: seated chin tucks, upper trap and levator scap stretch for left, row and extension with red    Consulted and Agree with Plan of Care Patient           Patient will benefit from skilled therapeutic intervention in order to improve the following deficits and impairments:  Postural dysfunction, Decreased strength, Impaired flexibility, Decreased range of motion, Pain  Visit Diagnosis: Cervicalgia  Muscle weakness (generalized)     Problem List Patient Active Problem List   Diagnosis Date Noted  . Acute pulmonary embolism ([place]) 08/21/2017  . Cellulitis of abdominal wall 03/09/2017  . Diabetes mellitus without complication ([place]) 08/67/6195  . Morbid obesity ([place]) 02/15/2017  . Skin tag 02/02/2017  . Complex atypical endometrial hyperplasia 02/02/2017    Hilda Blades, PT, DPT, LAT, ATC 02/27/20  2:47 PM Phone: [phone] Fax: [place] [facility] [address] [place], [place], [address] Phone: [phone]   Fax:  [phone]  Name: Tahnee Cifuentes MRN: [id number] Date of Birth: 17-Jun-1951

## 2020-03-04 ENCOUNTER — Ambulatory Visit: Payer: Medicare PPO | Admitting: Physical Therapy

## 2020-03-04 ENCOUNTER — Other Ambulatory Visit: Payer: Self-pay

## 2020-03-04 ENCOUNTER — Encounter: Payer: Self-pay | Admitting: Physical Therapy

## 2020-03-04 DIAGNOSIS — M6281 Muscle weakness (generalized): Secondary | ICD-10-CM

## 2020-03-04 DIAGNOSIS — M542 Cervicalgia: Secondary | ICD-10-CM

## 2020-03-04 NOTE — Therapy (Signed)
[place], [place], [address] Phone: [phone]   Fax:  [phone]  Physical Therapy Treatment  Patient Details  Name: Dezire Turk MRN: [id number] Date of Birth: May 16, 1951 Referring Provider (PT): Newman Pies, MD   Encounter Date: 03/04/2020   PT End of Session - 03/04/20 1402    Visit Number 5    Number of Visits 8    Date for PT Re-Evaluation 03/31/20    Authorization Type [employer] MEDICARE    Authorization Time Period KX at 15th visit, FOTO due at 6th visit    Progress Note Due on Visit 10    PT Start Time 1400    PT Stop Time 1440    PT Time Calculation (min) 40 min    Activity Tolerance Patient tolerated treatment well    Behavior During Therapy [facility] for tasks assessed/performed           Past Medical History:  Diagnosis Date  . Ankle swelling    bilateral  . Anxiety   . Diabetes mellitus without complication ([place])   . Hypertension   . Leg swelling    bilateral  . Plantar fasciitis    left foot  . Pre-diabetes   . Rosacea     Past Surgical History:  Procedure Laterality Date  . ABDOMINAL HYSTERECTOMY    . ACHILLES TENDON SURGERY    . CESAREAN SECTION    . CHOLECYSTECTOMY  07/27/2012   Procedure: LAPAROSCOPIC CHOLECYSTECTOMY WITH INTRAOPERATIVE CHOLANGIOGRAM;  Surgeon: Leighton Ruff, MD;  Location: [place];  Service: General;  Laterality: N/A;  . COLONOSCOPY    . DILATATION & CURETTAGE/HYSTEROSCOPY WITH MYOSURE N/A 01/25/2017   Procedure: DILATATION & CURETTAGE/HYSTEROSCOPY WITH MYOSURE;  Surgeon: Olga Millers, MD;  Location: [facility];  Service: Gynecology;  Laterality: N/A;  . FRACTURE SURGERY     right arm fracture and shoulder disclocation  . ROBOTIC ASSISTED TOTAL HYSTERECTOMY WITH BILATERAL SALPINGO OOPHERECTOMY N/A 02/15/2017   Procedure: XI ROBOTIC ASSISTED TOTAL HYSTERECTOMY WITH BILATERAL SALPINGO OOPHORECTOMY;  Surgeon: Everitt Amber, MD;  [facility];   Service: Gynecology;  Laterality: N/A;  . SENTINEL NODE BIOPSY N/A 02/15/2017   Procedure: SENTINEL NODE BIOPSY AND LEFT THIGH BIOPSY;  Surgeon: Everitt Amber, MD;  [facility];  Service: Gynecology;  Laterality: N/A;    There were no vitals filed for this visit.   Subjective Assessment - 03/04/20 1404    Subjective Patient reports she is doing well, she was not sore following last visit. She continues to feel she is improving with looking over her shoulder.    Patient Stated Goals Improve neck pain so she can move her neck without limitation    Currently in Pain? No/denies                             [facility] Adult PT Treatment/Exercise - 03/04/20 0001      Exercises   Exercises Neck      Neck Exercises: Machines for Strengthening   UBE (Upper Arm Bike) L1 x 4 min (2 fwd/bwd)      Neck Exercises: Theraband   Shoulder Extension 15 reps;Red   2 sets   Rows 15 reps;Green   2 sets   Shoulder External Rotation 10 reps   2 sets   Shoulder External Rotation Limitations yellow    Horizontal ABduction 10 reps   2 sets   Horizontal ABduction Limitations red - supine  Neck Exercises: Seated   Neck Retraction 10 reps;3 secs      Manual Therapy   Manual Therapy Myofascial release;Manual Traction;Passive ROM    Myofascial Release Suboccipital release    Passive ROM Upper trap stretches in supine    Manual Traction In combination with suboccipital release      Neck Exercises: Stretches   Upper Trapezius Stretch 2 reps;20 seconds    Levator Stretch 2 reps;20 seconds                  PT Education - 03/04/20 1402    Education Details HEP    Person(s) Educated Patient    Methods Explanation;Verbal cues    Comprehension Verbalized understanding;Returned demonstration;Verbal cues required;Need further instruction            PT Short Term Goals - 02/27/20 1441      PT SHORT TERM GOAL #1   Title Patient will be I with initial HEP to progress with PT     Time 4    Period Weeks    Status Achieved    Target Date 03/03/20      PT SHORT TERM GOAL #2   Title Patient will be able to demonstrate proper seated posture to reduce neck tension    Time 4    Period Weeks    Status Achieved    Target Date 03/03/20             PT Long Term Goals - 02/04/20 1422      PT LONG TERM GOAL #1   Title Patient will be I with final HEP to maintain progress with PT    Time 8    Period Weeks    Status New    Target Date 03/31/20      PT LONG TERM GOAL #2   Title Patient will demonstrate left cervical rotation AROM >/= 60 deg and non-painful to improve driving and dressing ability    Time 8    Period Weeks    Status New    Target Date 03/31/20      PT LONG TERM GOAL #3   Title Patient will demonstrated improved gross periscapular strength to >/= 4+/5 MMT to improve postural control and reduce neck tension    Time 8    Period Weeks    Status New    Target Date 03/31/20      PT LONG TERM GOAL #4   Title Patient will report improved functional level of </= 33% limitation on FOTO    Time 8    Period Weeks    Status New    Target Date 03/31/20                 Plan - 03/04/20 1403    Clinical Impression Statement Patient tolerated therapy well with no adverse effects. Continued with manual therapy and stretching with good benefit as patient continues to report improvement in ability to rotate her neck. Progressed postural strengthening with good tolerance and progressed HEP. She would benefit from continued skilled PT to improve cervical motion and postural control so she can reduce her neck and shoulder pain and return to previous level of function.    PT Treatment/Interventions ADLs/Self Care Home Management;Cryotherapy;Electrical Stimulation;Moist Heat;Traction;Neuromuscular re-education;Therapeutic exercise;Therapeutic activities;Patient/family education;Manual techniques;Dry needling;Passive range of motion;Taping;Spinal  Manipulations;Joint Manipulations    PT Next Visit Plan Assess HEP and progress PRN; manual/dry needling and stretching for bilat upper trap, levator scap, suboccipitals; assess neurodynamics and provided flossing exercises  PRN; postural strengthening    PT Home Exercise Plan [id number]: seated chin tucks, upper trap and levator scap stretch for left, row and extension with red, supine horiz abd with yellow, seated double ER with yellow    Consulted and Agree with Plan of Care Patient           Patient will benefit from skilled therapeutic intervention in order to improve the following deficits and impairments:  Postural dysfunction, Decreased strength, Impaired flexibility, Decreased range of motion, Pain  Visit Diagnosis: Cervicalgia  Muscle weakness (generalized)     Problem List Patient Active Problem List   Diagnosis Date Noted  . Acute pulmonary embolism ([place]) 08/21/2017  . Cellulitis of abdominal wall 03/09/2017  . Diabetes mellitus without complication ([place]) 25/85/2778  . Morbid obesity ([place]) 02/15/2017  . Skin tag 02/02/2017  . Complex atypical endometrial hyperplasia 02/02/2017    Hilda Blades, PT, DPT, LAT, ATC 03/04/20  3:39 PM Phone: [phone] Fax: [facility] [address] [place], [place], [address] Phone: [phone]   Fax:  [phone]  Name: Neely Kammerer MRN: [id number] Date of Birth: Jan 17, 1951

## 2020-03-04 NOTE — Patient Instructions (Signed)
Access Code: [email or web address] URL: [email or web address] Date: 03/04/2020 Prepared by: Hilda Blades  Exercises Seated Cervical Retraction - 2-3 x daily - 7 x weekly - 10 reps - 3 seconds hold Seated Cervical Sidebending Stretch - 2-3 x daily - 7 x weekly - 2 reps - 15 seconds hold Gentle Levator Scapulae Stretch - 2-3 x daily - 7 x weekly - 2 reps - 15 seconds hold Standing Row with Anchored Resistance - 1 x daily - 7 x weekly - 2 sets - 10 reps Scapular Retraction with Resistance Advanced - 1 x daily - 7 x weekly - 2 sets - 10 reps Shoulder External Rotation and Scapular Retraction with Resistance - 1 x daily - 7 x weekly - 2 sets - 10 reps Supine Shoulder Horizontal Abduction with Resistance - 1 x daily - 7 x weekly - 2 sets - 10 reps

## 2020-03-11 DIAGNOSIS — I2699 Other pulmonary embolism without acute cor pulmonale: Secondary | ICD-10-CM | POA: Diagnosis not present

## 2020-03-11 DIAGNOSIS — I472 Ventricular tachycardia: Secondary | ICD-10-CM | POA: Diagnosis not present

## 2020-03-11 DIAGNOSIS — E785 Hyperlipidemia, unspecified: Secondary | ICD-10-CM | POA: Diagnosis not present

## 2020-03-11 DIAGNOSIS — I10 Essential (primary) hypertension: Secondary | ICD-10-CM | POA: Diagnosis not present

## 2020-03-11 DIAGNOSIS — I480 Paroxysmal atrial fibrillation: Secondary | ICD-10-CM | POA: Diagnosis not present

## 2020-03-11 DIAGNOSIS — E119 Type 2 diabetes mellitus without complications: Secondary | ICD-10-CM | POA: Diagnosis not present

## 2020-03-13 ENCOUNTER — Other Ambulatory Visit: Payer: Self-pay

## 2020-03-13 ENCOUNTER — Ambulatory Visit: Payer: Medicare PPO | Admitting: Physical Therapy

## 2020-03-13 ENCOUNTER — Encounter: Payer: Self-pay | Admitting: Physical Therapy

## 2020-03-13 DIAGNOSIS — M6281 Muscle weakness (generalized): Secondary | ICD-10-CM

## 2020-03-13 DIAGNOSIS — M542 Cervicalgia: Secondary | ICD-10-CM | POA: Diagnosis not present

## 2020-03-13 NOTE — Therapy (Signed)
[place], [place], [address] Phone: [phone]   Fax:  [phone]  Physical Therapy Treatment  Patient Details  Name: Stacie Marquez MRN: [id number] Date of Birth: 02-11-1951 Referring Provider (PT): Newman Pies, MD   Encounter Date: 03/13/2020   PT End of Session - 03/13/20 1218    Visit Number 6    Number of Visits 8    Date for PT Re-Evaluation 03/31/20    Authorization Type [employer] MEDICARE    Authorization Time Period KX at 15th visit, FOTO due at 6th visit    Progress Note Due on Visit 10    PT Start Time 1215    PT Stop Time 1255    PT Time Calculation (min) 40 min    Activity Tolerance Patient tolerated treatment well    Behavior During Therapy [facility] for tasks assessed/performed           Past Medical History:  Diagnosis Date  . Ankle swelling    bilateral  . Anxiety   . Diabetes mellitus without complication ([place])   . Hypertension   . Leg swelling    bilateral  . Plantar fasciitis    left foot  . Pre-diabetes   . Rosacea     Past Surgical History:  Procedure Laterality Date  . ABDOMINAL HYSTERECTOMY    . ACHILLES TENDON SURGERY    . CESAREAN SECTION    . CHOLECYSTECTOMY  07/27/2012   Procedure: LAPAROSCOPIC CHOLECYSTECTOMY WITH INTRAOPERATIVE CHOLANGIOGRAM;  Surgeon: Leighton Ruff, MD;  Location: [place];  Service: General;  Laterality: N/A;  . COLONOSCOPY    . DILATATION & CURETTAGE/HYSTEROSCOPY WITH MYOSURE N/A 01/25/2017   Procedure: DILATATION & CURETTAGE/HYSTEROSCOPY WITH MYOSURE;  Surgeon: Olga Millers, MD;  Location: [place];  Service: Gynecology;  Laterality: N/A;  . FRACTURE SURGERY     right arm fracture and shoulder disclocation  . ROBOTIC ASSISTED TOTAL HYSTERECTOMY WITH BILATERAL SALPINGO OOPHERECTOMY N/A 02/15/2017   Procedure: XI ROBOTIC ASSISTED TOTAL HYSTERECTOMY WITH BILATERAL SALPINGO OOPHORECTOMY;  Surgeon: Everitt Amber, MD;  [facility];   Service: Gynecology;  Laterality: N/A;  . SENTINEL NODE BIOPSY N/A 02/15/2017   Procedure: SENTINEL NODE BIOPSY AND LEFT THIGH BIOPSY;  Surgeon: Everitt Amber, MD;  [facility];  Service: Gynecology;  Laterality: N/A;    There were no vitals filed for this visit.   Subjective Assessment - 03/13/20 1216    Subjective Patient reports everything has been going good since her last visit. No new issues.    Patient Stated Goals Improve neck pain so she can move her neck without limitation    Currently in Pain? No/denies              [facility] PT Assessment - 03/13/20 0001      Assessment   Medical Diagnosis Cervicalgia    Referring Provider (PT) Newman Pies, MD    Onset Date/Surgical Date 12/11/19      Observation/Other Assessments   Focus on Therapeutic Outcomes (FOTO)  49% limitation                         [facility] Adult PT Treatment/Exercise - 03/13/20 0001      Exercises   Exercises Neck      Neck Exercises: Machines for Strengthening   UBE (Upper Arm Bike) L1 x 4 min (2 fwd/bwd)      Neck Exercises: Theraband   Shoulder Extension 15 reps;Red  2 sets   Rows 15 reps;Green   2 sets   Shoulder External Rotation 10 reps;Red   2 sets   Shoulder External Rotation Limitations seated    Horizontal ABduction 10 reps;Red   2 sets   Horizontal ABduction Limitations supine      Neck Exercises: Seated   Neck Retraction 10 reps;3 secs      Manual Therapy   Manual Therapy Myofascial release;Manual Traction;Passive ROM    Myofascial Release Suboccipital release    Passive ROM Upper trap stretches in supine    Manual Traction In combination with suboccipital release      Neck Exercises: Stretches   Upper Trapezius Stretch 2 reps;20 seconds    Levator Stretch 2 reps;20 seconds                  PT Education - 03/13/20 1217    Education Details HEP    Person(s) Educated Patient    Methods Explanation    Comprehension Verbalized understanding             PT Short Term Goals - 02/27/20 1441      PT SHORT TERM GOAL #1   Title Patient will be I with initial HEP to progress with PT    Time 4    Period Weeks    Status Achieved    Target Date 03/03/20      PT SHORT TERM GOAL #2   Title Patient will be able to demonstrate proper seated posture to reduce neck tension    Time 4    Period Weeks    Status Achieved    Target Date 03/03/20             PT Long Term Goals - 02/04/20 1422      PT LONG TERM GOAL #1   Title Patient will be I with final HEP to maintain progress with PT    Time 8    Period Weeks    Status New    Target Date 03/31/20      PT LONG TERM GOAL #2   Title Patient will demonstrate left cervical rotation AROM >/= 60 deg and non-painful to improve driving and dressing ability    Time 8    Period Weeks    Status New    Target Date 03/31/20      PT LONG TERM GOAL #3   Title Patient will demonstrated improved gross periscapular strength to >/= 4+/5 MMT to improve postural control and reduce neck tension    Time 8    Period Weeks    Status New    Target Date 03/31/20      PT LONG TERM GOAL #4   Title Patient will report improved functional level of </= 33% limitation on FOTO    Time 8    Period Weeks    Status New    Target Date 03/31/20                 Plan - 03/13/20 1247    Clinical Impression Statement Patient tolerated therapy well with no adverse effects. She subjectively reports improvement and better ability to turn to look over her shoulder, and she states that she has not had any nerve pain in the past few weeks. Her FOTO score did worsen this visit compared to her evaluation and patient was educated on FOTO and functional status. Her HEP was progressed with visit with resistance for postural strengthening. Due to patient reporting great improvement,  she will return in approximately 2 weeks for reassessment. She would benefit from continued skilled PT to improve cervical motion and  postural control so she can reduce her neck and shoulder pain and return to previous level of function.    PT Treatment/Interventions ADLs/Self Care Home Management;Cryotherapy;Electrical Stimulation;Moist Heat;Traction;Neuromuscular re-education;Therapeutic exercise;Therapeutic activities;Patient/family education;Manual techniques;Dry needling;Passive range of motion;Taping;Spinal Manipulations;Joint Manipulations    PT Next Visit Plan Assess HEP and progress PRN; manual/dry needling and stretching for bilat upper trap, levator scap, suboccipitals; assess neurodynamics and provided flossing exercises PRN; postural strengthening    PT Home Exercise Plan [email or web address]: seated chin tucks, upper trap and levator scap stretch for left, row with green, extension with red, supine horiz abd with red, seated double ER with red    Consulted and Agree with Plan of Care Patient           Patient will benefit from skilled therapeutic intervention in order to improve the following deficits and impairments:  Postural dysfunction, Decreased strength, Impaired flexibility, Decreased range of motion, Pain  Visit Diagnosis: Cervicalgia  Muscle weakness (generalized)     Problem List Patient Active Problem List   Diagnosis Date Noted  . Acute pulmonary embolism ([place]) 08/21/2017  . Cellulitis of abdominal wall 03/09/2017  . Diabetes mellitus without complication ([place]) 64/15/8309  . Morbid obesity ([place]) 02/15/2017  . Skin tag 02/02/2017  . Complex atypical endometrial hyperplasia 02/02/2017    Hilda Blades, PT, DPT, LAT, ATC 03/13/20  12:59 PM Phone: [phone] Fax: [place] [facility] [address] [place], [place], [address] Phone: [phone]   Fax:  [phone]  Name: Sussan Meter MRN: [id number] Date of Birth: November 06, 1950

## 2020-03-31 ENCOUNTER — Ambulatory Visit: Payer: Medicare PPO | Attending: Family Medicine | Admitting: Physical Therapy

## 2020-03-31 ENCOUNTER — Other Ambulatory Visit: Payer: Self-pay

## 2020-03-31 ENCOUNTER — Encounter: Payer: Self-pay | Admitting: Physical Therapy

## 2020-03-31 DIAGNOSIS — M6281 Muscle weakness (generalized): Secondary | ICD-10-CM

## 2020-03-31 DIAGNOSIS — M542 Cervicalgia: Secondary | ICD-10-CM | POA: Insufficient documentation

## 2020-03-31 NOTE — Therapy (Signed)
[place], [place], [address] Phone: [phone]   Fax:  [phone]  Physical Therapy Treatment / ERO   Progress Note Reporting Period 02/04/2020 to 03/31/2020  See note below for Objective Data and Assessment of Progress/Goals.    Patient Details  Name: Stacie Marquez MRN: [id number] Date of Birth: 12-Aug-1951 Referring Provider (PT): Newman Pies, MD   Encounter Date: 03/31/2020   PT End of Session - 03/31/20 1357    Visit Number 7    Number of Visits 11    Date for PT Re-Evaluation 05/26/20    Authorization Type [employer]IC[employer]    Authorization Time Period KX at 15th visit, FOTO due at 6th visit    Progress Note Due on Visit 17    PT Start Time 1300    PT Stop Time 1340    PT Time Calculation (min) 40 min    Activity Tolerance Patient tolerated treatment well    Behavior During Therapy WFL for tasks assessed/performed           Past Medical History:  Diagnosis Date   Ankle swelling    bilateral   Anxiety    Diabetes mellitus without complication ([place])    Hypertension    Leg swelling    bilateral   Plantar fasciitis    left foot   Pre-diabetes    Rosacea     Past Surgical History:  Procedure Laterality Date   ABDOMINAL HYSTERECTOMY     ACHILLES TENDON SURGERY     CESAREAN SECTION     CHOLECYSTECTOMY  07/27/2012   Procedure: LAPAROSCOPIC CHOLECYSTECTOMY WITH INTRAOPERATIVE CHOLANGIOGRAM;  Surgeon: Leighton Ruff, MD;  Location: [facility];  Service: General;  Laterality: N/A;   COLONOSCOPY     DILATATION & CURETTAGE/HYSTEROSCOPY WITH MYOSURE N/A 01/25/2017   Procedure: [place];  Surgeon: Olga Millers, MD;  Location: [facility];  Service: Gynecology;  Laterality: N/A;   FRACTURE SURGERY     right arm fracture and shoulder disclocation   ROBOTIC ASSISTED TOTAL HYSTERECTOMY WITH BILATERAL SALPINGO OOPHERECTOMY N/A 02/15/2017    Procedure: XI ROBOTIC ASSISTED TOTAL HYSTERECTOMY WITH BILATERAL SALPINGO OOPHORECTOMY;  Surgeon: Everitt Amber, MD;  Location: [facility];  Service: Gynecology;  Laterality: N/A;   SENTINEL NODE BIOPSY N/A 02/15/2017   Procedure: SENTINEL NODE BIOPSY AND LEFT THIGH BIOPSY;  Surgeon: Everitt Amber, MD;  Location: [facility];  Service: Gynecology;  Laterality: N/A;    There were no vitals filed for this visit.   Subjective Assessment - 03/31/20 1359    Subjective Patient reports she is doing well and her exercises are good.    Patient Stated Goals Improve neck pain so she can move her neck without limitation    Currently in Pain? No/denies              [facility] PT Assessment - 03/31/20 0001      Assessment   Medical Diagnosis Cervicalgia    Referring Provider (PT) Newman Pies, MD    Onset Date/Surgical Date 12/11/19    Next MD Visit 04/2020      Precautions   Precautions None      Restrictions   Weight Bearing Restrictions No      Balance Screen   Has the patient fallen in the past 6 months No      Prior Function   Level of Independence Independent      Observation/Other Assessments   Focus on Therapeutic Outcomes (FOTO)  44% limitation   °  ° Sensation  ° Light Touch Appears Intact   °  ° Posture/Postural Control  ° Posture Comments Patient exhibits rounded shoulder and forward head posture   °  ° AROM  ° Cervical Flexion 40   ° Cervical Extension 40   ° Cervical - Right Side Bend 30   ° Cervical - Left Side Bend 30   ° Cervical - Right Rotation 60   ° Cervical - Left Rotation 60 - left sided upper trap discomfort   °  ° Strength  ° Overall Strength Comments Periscapular strength grossly 4/5 MMT bilaterally   °  ° Palpation  ° Palpation comment TTP left upper trap and levator scap region   °  °  °  ° ° ° ° ° ° ° ° ° ° ° ° ° ° ° ° [facility] Adult PT Treatment/Exercise - 03/31/20 0001   °  ° Neck Exercises: Machines for Strengthening  ° UBE (Upper Arm Bike) L1 x 4 min (2 fwd/bwd)   °  ° Neck  Exercises: Theraband  ° Shoulder Extension 15 reps;Green   2 sets  ° Rows 15 reps;Green   2 sets  ° Shoulder External Rotation 15 reps;Green   2 sets  ° Shoulder External Rotation Limitations seated   ° Horizontal ABduction 15 reps;Green   2 sets  ° Horizontal ABduction Limitations seated   °  ° Neck Exercises: Seated  ° Neck Retraction 10 reps;3 secs   °  ° Neck Exercises: Supine  ° Neck Retraction 10 reps;3 secs   °  ° Manual Therapy  ° Manual Therapy Myofascial release;Manual Traction;Passive ROM   ° Myofascial Release Suboccipital release, left upper trap   ° Passive ROM Upper trap stretches in supine   ° Manual Traction In combination with suboccipital release   °  ° Neck Exercises: Stretches  ° Upper Trapezius Stretch 2 reps;20 seconds   ° Levator Stretch 2 reps;20 seconds   °  °  °  ° ° ° ° ° ° ° ° ° PT Education - 03/31/20 1357   ° Education Details HEP   ° Person(s) Educated Patient   ° Methods Explanation   ° Comprehension Verbalized understanding   °  °  °  ° ° ° PT Short Term Goals - 02/27/20 1441   °  ° PT SHORT TERM GOAL #1  ° Title Patient will be I with initial HEP to progress with PT   ° Time 4   ° Period Weeks   ° Status Achieved   ° Target Date 03/03/20   °  ° PT SHORT TERM GOAL #2  ° Title Patient will be able to demonstrate proper seated posture to reduce neck tension   ° Time 4   ° Period Weeks   ° Status Achieved   ° Target Date 03/03/20   °  °  °  ° ° ° ° PT Long Term Goals - 03/31/20 1404   °  ° PT LONG TERM GOAL #1  ° Title Patient will be I with final HEP to maintain progress with PT   ° Baseline Patient requires cueing for HEP - 03/31/2020   ° Time 8   ° Period Weeks   ° Status On-going   ° Target Date 05/26/20   °  ° PT LONG TERM GOAL #2  ° Title Patient will demonstrate left cervical rotation AROM >/= 60 deg and non-painful to improve driving and   dressing ability    Baseline Patient exhibits 60 deg rotation but reports increased left upper trap discomfort with left rotation - 03/31/2020     Time 8    Period Weeks    Status Partially Met    Target Date 05/26/20      PT LONG TERM GOAL #3   Title Patient will demonstrated improved gross periscapular strength to >/= 4+/5 MMT to improve postural control and reduce neck tension    Baseline Patient exhibits 4/5 MMT periscapular strength - 03/31/2020    Time 8    Period Weeks    Status Partially Met    Target Date 05/26/20      PT LONG TERM GOAL #4   Title Patient will report improved functional level of </= 33% limitation on FOTO    Baseline 44% limitation - 03/31/2020    Time 8    Period Weeks    Status On-going    Target Date 05/26/20                 Plan - 03/31/20 1358    Clinical Impression Statement Patient tolerated therapy well with no adverse effects. She is progressing well with her cervical range of motion and reports improvement in functional level on FOTO. She is progressing toward her goals but does continue to have tightness/discomfort that seems related to left upper trap that mainly affects her rotating to look over left shoulder. Will continue POC for another 8 weeks with a frequency of every other week. She would benefit from continued skilled PT to improve cervical motion and postural control so she can reduce her neck and shoulder pain and return to previous level of function.    Personal Factors and Comorbidities Age;Fitness;Time since onset of injury/illness/exacerbation    Examination-Activity Limitations Reach Overhead;Dressing;Hygiene/Grooming    Examination-Participation Restrictions Driving;Cleaning;Laundry    Rehab Potential Good    PT Frequency Biweekly    PT Duration 8 weeks    PT Treatment/Interventions ADLs/Self Care Home Management;Cryotherapy;Electrical Stimulation;Moist Heat;Traction;Neuromuscular re-education;Therapeutic exercise;Therapeutic activities;Patient/family education;Manual techniques;Dry needling;Passive range of motion;Taping;Spinal Manipulations;Joint Manipulations    PT  Next Visit Plan Assess HEP and progress PRN; manual/dry needling and stretching for left upper trap, levator scap region; postural strengthening    PT Home Exercise Plan [id number]: seated chin tucks, upper trap and levator scap stretch for left, row with green, extension with red, supine horiz abd with red, seated double ER with red    Consulted and Agree with Plan of Care Patient           Patient will benefit from skilled therapeutic intervention in order to improve the following deficits and impairments:  Postural dysfunction, Decreased strength, Impaired flexibility, Decreased range of motion, Pain  Visit Diagnosis: Cervicalgia  Muscle weakness (generalized)     Problem List Patient Active Problem List   Diagnosis Date Noted   Acute pulmonary embolism ([place]) 08/21/2017   Cellulitis of abdominal wall 03/09/2017   Diabetes mellitus without complication ([place]) 23/55/7322   Morbid obesity ([place]) 02/15/2017   Skin tag 02/02/2017   Complex atypical endometrial hyperplasia 02/02/2017    Hilda Blades, PT, DPT, LAT, ATC 03/31/20  2:54 PM Phone: [phone] Fax: [facility] [address] [place], [place], [address] Phone: [phone]   Fax:  [phone]  Name: Kateline Kinkade MRN: [id number] Date of Birth: 1951/04/18

## 2020-04-09 DIAGNOSIS — R600 Localized edema: Secondary | ICD-10-CM | POA: Diagnosis not present

## 2020-04-09 DIAGNOSIS — I8312 Varicose veins of left lower extremity with inflammation: Secondary | ICD-10-CM | POA: Diagnosis not present

## 2020-04-09 DIAGNOSIS — I87323 Chronic venous hypertension (idiopathic) with inflammation of bilateral lower extremity: Secondary | ICD-10-CM | POA: Diagnosis not present

## 2020-04-09 DIAGNOSIS — I8311 Varicose veins of right lower extremity with inflammation: Secondary | ICD-10-CM | POA: Diagnosis not present

## 2020-04-14 ENCOUNTER — Ambulatory Visit: Payer: Medicare PPO | Admitting: Physical Therapy

## 2020-04-14 ENCOUNTER — Encounter: Payer: Self-pay | Admitting: Physical Therapy

## 2020-04-14 ENCOUNTER — Other Ambulatory Visit: Payer: Self-pay

## 2020-04-14 DIAGNOSIS — M6281 Muscle weakness (generalized): Secondary | ICD-10-CM | POA: Diagnosis not present

## 2020-04-14 DIAGNOSIS — M542 Cervicalgia: Secondary | ICD-10-CM | POA: Diagnosis not present

## 2020-04-14 NOTE — Therapy (Signed)
[place], [place], [address] Phone: [phone]   Fax:  [phone]  Physical Therapy Treatment  Patient Details  Name: Stacie Marquez MRN: [id number] Date of Birth: 1951-06-03 Referring Provider (PT): Newman Pies, MD   Encounter Date: 04/14/2020   PT End of Session - 04/14/20 1335    Visit Number 8    Number of Visits 11    Date for PT Re-Evaluation 05/26/20    Authorization Type [employer]IC[employer]    Authorization Time Period KX at 15th visit, FOTO due at 6th visit    Progress Note Due on Visit 17    PT Start Time 1333    PT Stop Time 1414    PT Time Calculation (min) 41 min    Activity Tolerance Patient tolerated treatment well    Behavior During Therapy [facility] for tasks assessed/performed           Past Medical History:  Diagnosis Date  . Ankle swelling    bilateral  . Anxiety   . Diabetes mellitus without complication ([place])   . Hypertension   . Leg swelling    bilateral  . Plantar fasciitis    left foot  . Pre-diabetes   . Rosacea     Past Surgical History:  Procedure Laterality Date  . ABDOMINAL HYSTERECTOMY    . ACHILLES TENDON SURGERY    . CESAREAN SECTION    . CHOLECYSTECTOMY  07/27/2012   Procedure: LAPAROSCOPIC CHOLECYSTECTOMY WITH INTRAOPERATIVE CHOLANGIOGRAM;  Surgeon: Leighton Ruff, MD;  Location: [facility];  Service: General;  Laterality: N/A;  . COLONOSCOPY    . DILATATION & CURETTAGE/HYSTEROSCOPY WITH MYOSURE N/A 01/25/2017   Procedure: DILATATION & CURETTAGE/HYSTEROSCOPY WITH MYOSURE;  Surgeon: Olga Millers, MD;  Location: [facility];  Service: Gynecology;  Laterality: N/A;  . FRACTURE SURGERY     right arm fracture and shoulder disclocation  . ROBOTIC ASSISTED TOTAL HYSTERECTOMY WITH BILATERAL SALPINGO OOPHERECTOMY N/A 02/15/2017   Procedure: XI ROBOTIC ASSISTED TOTAL HYSTERECTOMY WITH BILATERAL SALPINGO OOPHORECTOMY;  Surgeon: Everitt Amber, MD;  Location: [facility];   Service: Gynecology;  Laterality: N/A;  . SENTINEL NODE BIOPSY N/A 02/15/2017   Procedure: SENTINEL NODE BIOPSY AND LEFT THIGH BIOPSY;  Surgeon: Everitt Amber, MD;  Location: [facility];  Service: Gynecology;  Laterality: N/A;    There were no vitals filed for this visit.   Subjective Assessment - 04/14/20 1336    Subjective " I have been doing okay since the last session. I didn't do my exercises the last few days due to being on vacation."    Patient Stated Goals Improve neck pain so she can move her neck without limitation    Pain Score 4     Pain Location Neck    Pain Orientation Left    Pain Type Chronic pain    Pain Frequency Constant    Aggravating Factors  turning to the L, bending the neck foreard              [facility] PT Assessment - 04/14/20 0001      Assessment   Medical Diagnosis Cervicalgia    Referring Provider (PT) Newman Pies, MD                         [facility] Adult PT Treatment/Exercise - 04/14/20 0001      Neck Exercises: Seated   Other Seated Exercise upper cerivcal rotation 1 x 10 looking to the L  with tactile cues (using fingers under chin) to maintain chin tuck      Neck Exercises: Supine   Capital Flexion 10 reps;10 secs    Other Supine Exercise scapular retraction 2 x 10 with red theraband      Manual Therapy   Manual therapy comments skilled palpation and monitoring of pt throughout TPDN    Soft tissue mobilization IASTM along L upper trap/ levastor scapulae    Myofascial Release Suboccipital release,    Passive ROM cervical rotation working into end range with grade II-III oscillations      Neck Exercises: Stretches   Upper Trapezius Stretch Left;2 reps;30 seconds            Trigger Point Dry Needling - 04/14/20 0001    Consent Given? Yes    Education Handout Provided Yes    Muscles Treated Head and Neck Upper trapezius    Upper Trapezius Response Palpable increased muscle length;Twitch reponse elicited   L x 2 in supine                PT Education - 04/14/20 1340    Education Details muscle anatomy and referral patterns, What TPDN is, benefits and after care.    Person(s) Educated Patient    Methods Explanation;Verbal cues;Handout    Comprehension Verbal cues required;Verbalized understanding            PT Short Term Goals - 02/27/20 1441      PT SHORT TERM GOAL #1   Title Patient will be I with initial HEP to progress with PT    Time 4    Period Weeks    Status Achieved    Target Date 03/03/20      PT SHORT TERM GOAL #2   Title Patient will be able to demonstrate proper seated posture to reduce neck tension    Time 4    Period Weeks    Status Achieved    Target Date 03/03/20             PT Long Term Goals - 03/31/20 1404      PT LONG TERM GOAL #1   Title Patient will be I with final HEP to maintain progress with PT    Baseline Patient requires cueing for HEP - 03/31/2020    Time 8    Period Weeks    Status On-going    Target Date 05/26/20      PT LONG TERM GOAL #2   Title Patient will demonstrate left cervical rotation AROM >/= 60 deg and non-painful to improve driving and dressing ability    Baseline Patient exhibits 60 deg rotation but reports increased left upper trap discomfort with left rotation - 03/31/2020    Time 8    Period Weeks    Status Partially Met    Target Date 05/26/20      PT LONG TERM GOAL #3   Title Patient will demonstrated improved gross periscapular strength to >/= 4+/5 MMT to improve postural control and reduce neck tension    Baseline Patient exhibits 4/5 MMT periscapular strength - 03/31/2020    Time 8    Period Weeks    Status Partially Met    Target Date 05/26/20      PT LONG TERM GOAL #4   Title Patient will report improved functional level of </= 33% limitation on FOTO    Baseline 44% limitation - 03/31/2020    Time 8    Period Weeks  Status On-going    Target Date 05/26/20                 Plan - 04/14/20 1407    Clinical  Impression Statement pt reports continued stiffness in the L upper trap with 3-4/10 paing. educated and consent was provided for TPDN on the L upper trap followed with cervical PROM rotation and IASTM along the L upper trap. Continued working posterior shoulder musculature and DNF training. End of session she reported no pain and declined modalities.    PT Next Visit Plan Assess HEP and progress PRN; manual/dry needling and stretching for left upper trap, levator scap region; postural strengthening, Response to TPDN    PT Home Exercise Plan [id number]: seated chin tucks, upper trap and levator scap stretch for left, row with green, extension with red, supine horiz abd with red, seated double ER with red    Consulted and Agree with Plan of Care Patient           Patient will benefit from skilled therapeutic intervention in order to improve the following deficits and impairments:  Postural dysfunction, Decreased strength, Impaired flexibility, Decreased range of motion, Pain  Visit Diagnosis: Cervicalgia  Muscle weakness (generalized)     Problem List Patient Active Problem List   Diagnosis Date Noted  . Acute pulmonary embolism ([place]) 08/21/2017  . Cellulitis of abdominal wall 03/09/2017  . Diabetes mellitus without complication ([place]) 09/62/8366  . Morbid obesity ([place]) 02/15/2017  . Skin tag 02/02/2017  . Complex atypical endometrial hyperplasia 02/02/2017   [place] [place] PT, DPT, LAT, ATC  04/14/20  2:17 PM      [facility] [address] [place], [place], [address] Phone: [phone]   Fax:  [phone]  Name: Sanayah Munro MRN: [id number] Date of Birth: October 14, 1950

## 2020-04-29 ENCOUNTER — Ambulatory Visit: Payer: Medicare PPO | Attending: Neurosurgery | Admitting: Physical Therapy

## 2020-04-29 ENCOUNTER — Encounter: Payer: Self-pay | Admitting: Physical Therapy

## 2020-04-29 ENCOUNTER — Other Ambulatory Visit: Payer: Self-pay

## 2020-04-29 DIAGNOSIS — M542 Cervicalgia: Secondary | ICD-10-CM | POA: Diagnosis not present

## 2020-04-29 DIAGNOSIS — M6281 Muscle weakness (generalized): Secondary | ICD-10-CM | POA: Insufficient documentation

## 2020-04-29 NOTE — Therapy (Signed)
[place] [employer], [place], [address] Phone: [phone]   Fax:  [phone]  Physical Therapy Treatment  Patient Details  Name: Stacie Marquez MRN: [id number] Date of Birth: 09/18/1950 Referring Provider (PT): Newman Pies, MD   Encounter Date: 04/29/2020   PT End of Session - 04/29/20 1453    Visit Number 9    Number of Visits 11    Date for PT Re-Evaluation 05/26/20    Authorization Type [employer]IC[employer]    Authorization Time Period KX at 15th visit, FOTO due at 6th visit    Progress Note Due on Visit 17    PT Start Time 1445    PT Stop Time 1525    PT Time Calculation (min) 40 min    Activity Tolerance Patient tolerated treatment well    Behavior During Therapy [facility] for tasks assessed/performed           Past Medical History:  Diagnosis Date  . Ankle swelling    bilateral  . Anxiety   . Diabetes mellitus without complication ([place])   . Hypertension   . Leg swelling    bilateral  . Plantar fasciitis    left foot  . Pre-diabetes   . Rosacea     Past Surgical History:  Procedure Laterality Date  . ABDOMINAL HYSTERECTOMY    . ACHILLES TENDON SURGERY    . CESAREAN SECTION    . CHOLECYSTECTOMY  07/27/2012   Procedure: LAPAROSCOPIC CHOLECYSTECTOMY WITH INTRAOPERATIVE CHOLANGIOGRAM;  Surgeon: Leighton Ruff, MD;  Location: [facility];  Service: General;  Laterality: N/A;  . COLONOSCOPY    . DILATATION & CURETTAGE/HYSTEROSCOPY WITH MYOSURE N/A 01/25/2017   Procedure: DILATATION & CURETTAGE/HYSTEROSCOPY WITH MYOSURE;  Surgeon: Olga Millers, MD;  Location: [facility];  Service: Gynecology;  Laterality: N/A;  . FRACTURE SURGERY     right arm fracture and shoulder disclocation  . ROBOTIC ASSISTED TOTAL HYSTERECTOMY WITH BILATERAL SALPINGO OOPHERECTOMY N/A 02/15/2017   Procedure: XI ROBOTIC ASSISTED TOTAL HYSTERECTOMY WITH BILATERAL SALPINGO OOPHORECTOMY;  Surgeon: Everitt Amber, MD;  Location: [facility];   Service: Gynecology;  Laterality: N/A;  . SENTINEL NODE BIOPSY N/A 02/15/2017   Procedure: SENTINEL NODE BIOPSY AND LEFT THIGH BIOPSY;  Surgeon: Everitt Amber, MD;  Location: [facility];  Service: Gynecology;  Laterality: N/A;    There were no vitals filed for this visit.   Subjective Assessment - 04/29/20 1449    Subjective Patient reports she is doing better and think the needling helped. She has been consistent with exercises at home and she has more movement with her neck.    Patient Stated Goals Improve neck pain so she can move her neck without limitation    Currently in Pain? No/denies              [facility] PT Assessment - 04/29/20 0001      Assessment   Medical Diagnosis Cervicalgia    Referring Provider (PT) Newman Pies, MD    Onset Date/Surgical Date 12/11/19    Next MD Visit 05/19/2020 or 05/20/2020      Precautions   Precautions None      Restrictions   Weight Bearing Restrictions No      Balance Screen   Has the patient fallen in the past 6 months No      Prior Function   Level of Independence Independent      Cognition   Overall Cognitive Status Within Functional Limits for tasks assessed  Observation/Other Assessments   Observations Patient appears in no apparent distress    Focus on Therapeutic Outcomes (FOTO)  44% limitation      Sensation   Light Touch Appears Intact      AROM   Cervical Flexion 40    Cervical Extension 40    Cervical - Right Side Bend 32    Cervical - Left Side Bend 30    Cervical - Right Rotation 60    Cervical - Left Rotation 60      Strength   Overall Strength Comments Periscapular strength grossly 4/5 MMT bilaterally                         [facility] Adult PT Treatment/Exercise - 04/29/20 0001      Exercises   Exercises Neck      Neck Exercises: Machines for Strengthening   UBE (Upper Arm Bike) L1 x 4 min (2 fwd/bwd)      Neck Exercises: Theraband   Rows 15 reps;Green   2 sets   Shoulder External  Rotation 15 reps;Green   2 sets   Horizontal ABduction 15 reps;Green   2 sets     Neck Exercises: Supine   Neck Retraction 10 reps;3 secs      Manual Therapy   Manual Therapy Myofascial release;Manual Traction;Passive ROM    Myofascial Release Suboccipital release,    Passive ROM Cervical rotation and side bending to end range    Manual Traction Combination with suboccipital release                  PT Education - 04/29/20 1452    Education Details HEP    Person(s) Educated Patient    Methods Explanation    Comprehension Verbalized understanding            PT Short Term Goals - 02/27/20 1441      PT SHORT TERM GOAL #1   Title Patient will be I with initial HEP to progress with PT    Time 4    Period Weeks    Status Achieved    Target Date 03/03/20      PT SHORT TERM GOAL #2   Title Patient will be able to demonstrate proper seated posture to reduce neck tension    Time 4    Period Weeks    Status Achieved    Target Date 03/03/20             PT Long Term Goals - 03/31/20 1404      PT LONG TERM GOAL #1   Title Patient will be I with final HEP to maintain progress with PT    Baseline Patient requires cueing for HEP - 03/31/2020    Time 8    Period Weeks    Status On-going    Target Date 05/26/20      PT LONG TERM GOAL #2   Title Patient will demonstrate left cervical rotation AROM >/= 60 deg and non-painful to improve driving and dressing ability    Baseline Patient exhibits 60 deg rotation but reports increased left upper trap discomfort with left rotation - 03/31/2020    Time 8    Period Weeks    Status Partially Met    Target Date 05/26/20      PT LONG TERM GOAL #3   Title Patient will demonstrated improved gross periscapular strength to >/= 4+/5 MMT to improve postural control and reduce neck tension  Baseline Patient exhibits 4/5 MMT periscapular strength - 03/31/2020    Time 8    Period Weeks    Status Partially Met    Target Date 05/26/20       PT LONG TERM GOAL #4   Title Patient will report improved functional level of </= 33% limitation on FOTO    Baseline 44% limitation - 03/31/2020    Time 8    Period Weeks    Status On-going    Target Date 05/26/20                 Plan - 04/29/20 1519    Clinical Impression Statement Patient tolerated therapy well with no adverse effects. She denied any pain this visit but reports continued stiffness with movement especially side bending and rotation. She exhibits good AROM and did report slight improvement in functional level overall. She is progressing well with postural strengthening but continues to demonstrate forward head posture likely contributing to her symptoms. She would benefit from continued skilled PT to improve cervical motion and postural control so she can reduce her neck and shoulder pain and return to previous level of function.    PT Treatment/Interventions ADLs/Self Care Home Management;Cryotherapy;Electrical Stimulation;Moist Heat;Traction;Neuromuscular re-education;Therapeutic exercise;Therapeutic activities;Patient/family education;Manual techniques;Dry needling;Passive range of motion;Taping;Spinal Manipulations;Joint Manipulations    PT Next Visit Plan Assess HEP and progress PRN; manual/dry needling and stretching for left upper trap, levator scap region; postural strengthening    PT Home Exercise Plan [id number]: seated chin tucks, upper trap and levator scap stretch for left, row with green, extension with green, supine horiz abd with green, seated double ER with green    Consulted and Agree with Plan of Care Patient           Patient will benefit from skilled therapeutic intervention in order to improve the following deficits and impairments:  Postural dysfunction, Decreased strength, Impaired flexibility, Decreased range of motion, Pain  Visit Diagnosis: Cervicalgia  Muscle weakness (generalized)     Problem List Patient Active Problem List    Diagnosis Date Noted  . Acute pulmonary embolism ([place]) 08/21/2017  . Cellulitis of abdominal wall 03/09/2017  . Diabetes mellitus without complication ([place]) 95/04/3266  . Morbid obesity ([place]) 02/15/2017  . Skin tag 02/02/2017  . Complex atypical endometrial hyperplasia 02/02/2017    Hilda Blades, PT, DPT, LAT, ATC 04/29/20  3:29 PM Phone: [phone] Fax: [facility] [address] [place], [place], [address] Phone: [phone]   Fax:  [phone]  Name: Stacie Marquez MRN: [id number] Date of Birth: 1951-03-28

## 2020-05-07 DIAGNOSIS — I87323 Chronic venous hypertension (idiopathic) with inflammation of bilateral lower extremity: Secondary | ICD-10-CM | POA: Diagnosis not present

## 2020-05-07 DIAGNOSIS — I8312 Varicose veins of left lower extremity with inflammation: Secondary | ICD-10-CM | POA: Diagnosis not present

## 2020-05-07 DIAGNOSIS — R600 Localized edema: Secondary | ICD-10-CM | POA: Diagnosis not present

## 2020-05-07 DIAGNOSIS — I8311 Varicose veins of right lower extremity with inflammation: Secondary | ICD-10-CM | POA: Diagnosis not present

## 2020-05-14 ENCOUNTER — Ambulatory Visit: Payer: Medicare PPO | Admitting: Physical Therapy

## 2020-05-14 ENCOUNTER — Encounter: Payer: Self-pay | Admitting: Physical Therapy

## 2020-05-14 ENCOUNTER — Other Ambulatory Visit: Payer: Self-pay

## 2020-05-14 DIAGNOSIS — M542 Cervicalgia: Secondary | ICD-10-CM

## 2020-05-14 DIAGNOSIS — M6281 Muscle weakness (generalized): Secondary | ICD-10-CM | POA: Diagnosis not present

## 2020-05-14 NOTE — Therapy (Addendum)
[place], [place], [address] Phone: [phone]   Fax:  [phone]  Physical Therapy Treatment  / Discharge  Patient Details  Name: Stacie Marquez MRN: [id number] Date of Birth: 04/22/1951 Referring Provider (PT): Newman Pies, MD   Encounter Date: 05/14/2020   PT End of Session - 05/14/20 1416    Visit Number 10    Number of Visits 11    Date for PT Re-Evaluation 05/26/20    Authorization Type [employer] MEDIC[employer]    Authorization Time Period KX at 15th visit, FOTO due at 6th visit    Progress Note Due on Visit 17    PT Start Time 1415    PT Stop Time 1459    PT Time Calculation (min) 44 min    Activity Tolerance Patient tolerated treatment well    Behavior During Therapy [facility] for tasks assessed/performed           Past Medical History:  Diagnosis Date  . Ankle swelling    bilateral  . Anxiety   . Diabetes mellitus without complication ([place])   . Hypertension   . Leg swelling    bilateral  . Plantar fasciitis    left foot  . Pre-diabetes   . Rosacea     Past Surgical History:  Procedure Laterality Date  . ABDOMINAL HYSTERECTOMY    . ACHILLES TENDON SURGERY    . CESAREAN SECTION    . CHOLECYSTECTOMY  07/27/2012   Procedure: LAPAROSCOPIC CHOLECYSTECTOMY WITH INTRAOPERATIVE CHOLANGIOGRAM;  Surgeon: Leighton Ruff, MD;  Location: [place];  Service: General;  Laterality: N/A;  . COLONOSCOPY    . DILATATION & CURETTAGE/HYSTEROSCOPY WITH MYOSURE N/A 01/25/2017   Procedure: DILATATION & CURETTAGE/HYSTEROSCOPY WITH MYOSURE;  Surgeon: Olga Millers, MD;  Location: [facility];  Service: Gynecology;  Laterality: N/A;  . FRACTURE SURGERY     right arm fracture and shoulder disclocation  . ROBOTIC ASSISTED TOTAL HYSTERECTOMY WITH BILATERAL SALPINGO OOPHERECTOMY N/A 02/15/2017   Procedure: XI ROBOTIC ASSISTED TOTAL HYSTERECTOMY WITH BILATERAL SALPINGO OOPHORECTOMY;  Surgeon: Everitt Amber, MD;  Location:  [facility];  Service: Gynecology;  Laterality: N/A;  . SENTINEL NODE BIOPSY N/A 02/15/2017   Procedure: SENTINEL NODE BIOPSY AND LEFT THIGH BIOPSY;  Surgeon: Everitt Amber, MD;  Location: [facility];  Service: Gynecology;  Laterality: N/A;    There were no vitals filed for this visit.   Subjective Assessment - 05/14/20 1419    Subjective " I think the DN helped last time, most of issue is maintaining a specific position then return to neutral which causes issues."    Patient Stated Goals Improve neck pain so she can move her neck without limitation    Currently in Pain? No/denies    Aggravating Factors  returning to neutral after maintain a specific postion.    Pain Relieving Factors rest,              [facility] PT Assessment - 05/14/20 0001      Assessment   Medical Diagnosis Cervicalgia                         [facility] Adult PT Treatment/Exercise - 05/14/20 0001      Self-Care   Self-Care Other Self-Care Comments    Other Self-Care Comments  self trigger point release techniques      Neck Exercises: Theraband   Other Theraband Exercises bil shoulder flexion pulling out on red theraband  2 x  12      Neck Exercises: Supine   Neck Retraction 10 reps;10 secs   chin tuck head lift   Other Supine Exercise scapular protraction 2 x 15 with rhythmic      Manual Therapy   Manual Therapy Other (comment)    Manual therapy comments skilled palpation and monitoring of pt throughout TPDN    Soft tissue mobilization IASTM along L upper trap/ levastor scapulae    Myofascial Release Suboccipital release,    Other Manual Therapy MTPR over the levator scapulae x 2      Neck Exercises: Stretches   Upper Trapezius Stretch Left;2 reps;30 seconds    Levator Stretch Left;2 reps;30 seconds            Trigger Point Dry Needling - 05/14/20 0001    Consent Given? Yes    Education Handout Provided Previously provided    Upper Trapezius Response Twitch reponse elicited;Palpable increased  muscle length   with pt in supine               PT Education - 05/14/20 1459    Education Details reviewed HEP and how to perform self trigger point release and tools that can assist with treatment.    Person(s) Educated Patient    Methods Explanation;Verbal cues    Comprehension Verbalized understanding;Verbal cues required            PT Short Term Goals - 02/27/20 1441      PT SHORT TERM GOAL #1   Title Patient will be I with initial HEP to progress with PT    Time 4    Period Weeks    Status Achieved    Target Date 03/03/20      PT SHORT TERM GOAL #2   Title Patient will be able to demonstrate proper seated posture to reduce neck tension    Time 4    Period Weeks    Status Achieved    Target Date 03/03/20             PT Long Term Goals - 03/31/20 1404      PT LONG TERM GOAL #1   Title Patient will be I with final HEP to maintain progress with PT    Baseline Patient requires cueing for HEP - 03/31/2020    Time 8    Period Weeks    Status On-going    Target Date 05/26/20      PT LONG TERM GOAL #2   Title Patient will demonstrate left cervical rotation AROM >/= 60 deg and non-painful to improve driving and dressing ability    Baseline Patient exhibits 60 deg rotation but reports increased left upper trap discomfort with left rotation - 03/31/2020    Time 8    Period Weeks    Status Partially Met    Target Date 05/26/20      PT LONG TERM GOAL #3   Title Patient will demonstrated improved gross periscapular strength to >/= 4+/5 MMT to improve postural control and reduce neck tension    Baseline Patient exhibits 4/5 MMT periscapular strength - 03/31/2020    Time 8    Period Weeks    Status Partially Met    Target Date 05/26/20      PT LONG TERM GOAL #4   Title Patient will report improved functional level of </= 33% limitation on FOTO    Baseline 44% limitation - 03/31/2020    Time 8    Period Weeks  Status On-going    Target Date 05/26/20                  Plan - 05/14/20 1457    Clinical Impression Statement pt reports no pain today but notes continues stiffness in the L upper trap. continued TPDN focusin gon the L upper trap with pt in supine and MTPR along the levator scapulae. continued working on DNF strengthening and scapular stability. reviewed how to perform self trigger point release. end of session she reported decreased pain/ stiffness.    PT Treatment/Interventions ADLs/Self Care Home Management;Cryotherapy;Electrical Stimulation;Moist Heat;Traction;Neuromuscular re-education;Therapeutic exercise;Therapeutic activities;Patient/family education;Manual techniques;Dry needling;Passive range of motion;Taping;Spinal Manipulations;Joint Manipulations    PT Next Visit Plan ERO vs D/C, Assess HEP and progress PRN; manual/dry needling and stretching for left upper trap, levator scap region; postural strengthening    PT Home Exercise Plan [id number]: seated chin tucks, upper trap and levator scap stretch for left, row with green, extension with green, supine horiz abd with green, seated double ER with green    Consulted and Agree with Plan of Care Patient           Patient will benefit from skilled therapeutic intervention in order to improve the following deficits and impairments:  Postural dysfunction, Decreased strength, Impaired flexibility, Decreased range of motion, Pain  Visit Diagnosis: Cervicalgia  Muscle weakness (generalized)     Problem List Patient Active Problem List   Diagnosis Date Noted  . Acute pulmonary embolism ([place]) 08/21/2017  . Cellulitis of abdominal wall 03/09/2017  . Diabetes mellitus without complication ([place]) 95/39/6728  . Morbid obesity ([place]) 02/15/2017  . Skin tag 02/02/2017  . Complex atypical endometrial hyperplasia 02/02/2017    Starr Lake PT, DPT, LAT, ATC 05/14/2020, 3:03 PM  [facility] [address] [place], [place],  [address] Phone: [phone]   Fax:  [phone]  Name: Stacie Marquez MRN: [id number] Date of Birth: [birth year]     PHYSICAL THERAPY DISCHARGE SUMMARY  Visits from Start of Care: 10  Current functional level related to goals / functional outcomes: See goals   Remaining deficits: Current status unknown   Education / Equipment: HEP  Plan: Patient agrees to discharge.  Patient goals were partially met. Patient is being discharged due to not returning since the last visit.  ?????        Sinia Antosh PT, DPT, LAT, ATC  06/23/20  1:15 PM

## 2020-05-15 DIAGNOSIS — L648 Other androgenic alopecia: Secondary | ICD-10-CM | POA: Diagnosis not present

## 2020-05-20 DIAGNOSIS — M542 Cervicalgia: Secondary | ICD-10-CM | POA: Diagnosis not present

## 2020-05-26 ENCOUNTER — Ambulatory Visit: Payer: Medicare PPO | Admitting: Physical Therapy

## 2020-05-27 DIAGNOSIS — R600 Localized edema: Secondary | ICD-10-CM | POA: Diagnosis not present

## 2020-05-27 DIAGNOSIS — I87323 Chronic venous hypertension (idiopathic) with inflammation of bilateral lower extremity: Secondary | ICD-10-CM | POA: Diagnosis not present

## 2020-05-27 DIAGNOSIS — I8311 Varicose veins of right lower extremity with inflammation: Secondary | ICD-10-CM | POA: Diagnosis not present

## 2020-06-10 DIAGNOSIS — I472 Ventricular tachycardia: Secondary | ICD-10-CM | POA: Diagnosis not present

## 2020-06-10 DIAGNOSIS — E119 Type 2 diabetes mellitus without complications: Secondary | ICD-10-CM | POA: Diagnosis not present

## 2020-06-10 DIAGNOSIS — I480 Paroxysmal atrial fibrillation: Secondary | ICD-10-CM | POA: Diagnosis not present

## 2020-06-10 DIAGNOSIS — E785 Hyperlipidemia, unspecified: Secondary | ICD-10-CM | POA: Diagnosis not present

## 2020-06-10 DIAGNOSIS — I10 Essential (primary) hypertension: Secondary | ICD-10-CM | POA: Diagnosis not present

## 2020-06-10 DIAGNOSIS — I2699 Other pulmonary embolism without acute cor pulmonale: Secondary | ICD-10-CM | POA: Diagnosis not present

## 2020-06-25 DIAGNOSIS — I8312 Varicose veins of left lower extremity with inflammation: Secondary | ICD-10-CM | POA: Diagnosis not present

## 2020-06-27 DIAGNOSIS — I8312 Varicose veins of left lower extremity with inflammation: Secondary | ICD-10-CM | POA: Diagnosis not present

## 2020-07-10 DIAGNOSIS — E785 Hyperlipidemia, unspecified: Secondary | ICD-10-CM | POA: Diagnosis not present

## 2020-07-10 DIAGNOSIS — I472 Ventricular tachycardia: Secondary | ICD-10-CM | POA: Diagnosis not present

## 2020-07-10 DIAGNOSIS — I10 Essential (primary) hypertension: Secondary | ICD-10-CM | POA: Diagnosis not present

## 2020-07-10 DIAGNOSIS — I480 Paroxysmal atrial fibrillation: Secondary | ICD-10-CM | POA: Diagnosis not present

## 2020-07-10 DIAGNOSIS — I2699 Other pulmonary embolism without acute cor pulmonale: Secondary | ICD-10-CM | POA: Diagnosis not present

## 2020-07-10 DIAGNOSIS — E119 Type 2 diabetes mellitus without complications: Secondary | ICD-10-CM | POA: Diagnosis not present

## 2020-07-11 DIAGNOSIS — R1011 Right upper quadrant pain: Secondary | ICD-10-CM | POA: Diagnosis not present

## 2020-07-11 DIAGNOSIS — Z1331 Encounter for screening for depression: Secondary | ICD-10-CM | POA: Diagnosis not present

## 2020-07-11 DIAGNOSIS — Z6841 Body Mass Index (BMI) 40.0 and over, adult: Secondary | ICD-10-CM | POA: Diagnosis not present

## 2020-07-11 DIAGNOSIS — Z23 Encounter for immunization: Secondary | ICD-10-CM | POA: Diagnosis not present

## 2020-07-11 DIAGNOSIS — Z1389 Encounter for screening for other disorder: Secondary | ICD-10-CM | POA: Diagnosis not present

## 2020-07-14 ENCOUNTER — Other Ambulatory Visit (HOSPITAL_COMMUNITY): Payer: Self-pay | Admitting: General Practice

## 2020-07-14 DIAGNOSIS — R1011 Right upper quadrant pain: Secondary | ICD-10-CM

## 2020-07-22 ENCOUNTER — Ambulatory Visit (HOSPITAL_COMMUNITY)
Admission: RE | Admit: 2020-07-22 | Discharge: 2020-07-22 | Disposition: A | Discharge status: Home / Self Care | Payer: Medicare PPO | Source: Ambulatory Visit | Attending: General Practice | Admitting: General Practice

## 2020-07-22 ENCOUNTER — Other Ambulatory Visit: Payer: Self-pay

## 2020-07-22 DIAGNOSIS — R1011 Right upper quadrant pain: Secondary | ICD-10-CM

## 2020-07-22 DIAGNOSIS — Z9049 Acquired absence of other specified parts of digestive tract: Secondary | ICD-10-CM | POA: Diagnosis not present

## 2020-08-01 DIAGNOSIS — I8311 Varicose veins of right lower extremity with inflammation: Secondary | ICD-10-CM | POA: Diagnosis not present

## 2020-08-04 DIAGNOSIS — I8311 Varicose veins of right lower extremity with inflammation: Secondary | ICD-10-CM | POA: Diagnosis not present

## 2020-09-05 DIAGNOSIS — I8312 Varicose veins of left lower extremity with inflammation: Secondary | ICD-10-CM | POA: Diagnosis not present

## 2020-09-16 DIAGNOSIS — I8311 Varicose veins of right lower extremity with inflammation: Secondary | ICD-10-CM | POA: Diagnosis not present

## 2020-09-18 DIAGNOSIS — I8390 Asymptomatic varicose veins of unspecified lower extremity: Secondary | ICD-10-CM | POA: Diagnosis not present

## 2020-09-18 DIAGNOSIS — I480 Paroxysmal atrial fibrillation: Secondary | ICD-10-CM | POA: Diagnosis not present

## 2020-09-18 DIAGNOSIS — E785 Hyperlipidemia, unspecified: Secondary | ICD-10-CM | POA: Diagnosis not present

## 2020-09-18 DIAGNOSIS — I10 Essential (primary) hypertension: Secondary | ICD-10-CM | POA: Diagnosis not present

## 2020-09-18 DIAGNOSIS — E119 Type 2 diabetes mellitus without complications: Secondary | ICD-10-CM | POA: Diagnosis not present

## 2020-09-18 DIAGNOSIS — I2699 Other pulmonary embolism without acute cor pulmonale: Secondary | ICD-10-CM | POA: Diagnosis not present

## 2020-09-18 DIAGNOSIS — I472 Ventricular tachycardia: Secondary | ICD-10-CM | POA: Diagnosis not present

## 2020-10-02 DIAGNOSIS — E782 Mixed hyperlipidemia: Secondary | ICD-10-CM | POA: Diagnosis not present

## 2020-10-02 DIAGNOSIS — E7849 Other hyperlipidemia: Secondary | ICD-10-CM | POA: Diagnosis not present

## 2020-10-02 DIAGNOSIS — R5383 Other fatigue: Secondary | ICD-10-CM | POA: Diagnosis not present

## 2020-10-02 DIAGNOSIS — R739 Hyperglycemia, unspecified: Secondary | ICD-10-CM | POA: Diagnosis not present

## 2020-10-02 DIAGNOSIS — E039 Hypothyroidism, unspecified: Secondary | ICD-10-CM | POA: Diagnosis not present

## 2020-10-02 DIAGNOSIS — E119 Type 2 diabetes mellitus without complications: Secondary | ICD-10-CM | POA: Diagnosis not present

## 2020-10-02 DIAGNOSIS — I10 Essential (primary) hypertension: Secondary | ICD-10-CM | POA: Diagnosis not present

## 2020-10-03 DIAGNOSIS — I8312 Varicose veins of left lower extremity with inflammation: Secondary | ICD-10-CM | POA: Diagnosis not present

## 2020-10-06 DIAGNOSIS — I8312 Varicose veins of left lower extremity with inflammation: Secondary | ICD-10-CM | POA: Diagnosis not present

## 2020-10-08 DIAGNOSIS — Z86718 Personal history of other venous thrombosis and embolism: Secondary | ICD-10-CM | POA: Diagnosis not present

## 2020-10-08 DIAGNOSIS — R1011 Right upper quadrant pain: Secondary | ICD-10-CM | POA: Diagnosis not present

## 2020-10-08 DIAGNOSIS — E7849 Other hyperlipidemia: Secondary | ICD-10-CM | POA: Diagnosis not present

## 2020-10-08 DIAGNOSIS — K760 Fatty (change of) liver, not elsewhere classified: Secondary | ICD-10-CM | POA: Diagnosis not present

## 2020-10-08 DIAGNOSIS — E119 Type 2 diabetes mellitus without complications: Secondary | ICD-10-CM | POA: Diagnosis not present

## 2020-10-08 DIAGNOSIS — F419 Anxiety disorder, unspecified: Secondary | ICD-10-CM | POA: Diagnosis not present

## 2020-10-08 DIAGNOSIS — Z6841 Body Mass Index (BMI) 40.0 and over, adult: Secondary | ICD-10-CM | POA: Diagnosis not present

## 2020-10-08 DIAGNOSIS — E782 Mixed hyperlipidemia: Secondary | ICD-10-CM | POA: Diagnosis not present

## 2020-10-08 DIAGNOSIS — Z86711 Personal history of pulmonary embolism: Secondary | ICD-10-CM | POA: Diagnosis not present

## 2020-10-21 DIAGNOSIS — I8311 Varicose veins of right lower extremity with inflammation: Secondary | ICD-10-CM | POA: Diagnosis not present

## 2020-11-04 DIAGNOSIS — I8312 Varicose veins of left lower extremity with inflammation: Secondary | ICD-10-CM | POA: Diagnosis not present

## 2020-11-18 DIAGNOSIS — I8311 Varicose veins of right lower extremity with inflammation: Secondary | ICD-10-CM | POA: Diagnosis not present

## 2020-12-09 DIAGNOSIS — I8312 Varicose veins of left lower extremity with inflammation: Secondary | ICD-10-CM | POA: Diagnosis not present

## 2020-12-11 DIAGNOSIS — L820 Inflamed seborrheic keratosis: Secondary | ICD-10-CM | POA: Diagnosis not present

## 2020-12-11 DIAGNOSIS — D1801 Hemangioma of skin and subcutaneous tissue: Secondary | ICD-10-CM | POA: Diagnosis not present

## 2020-12-11 DIAGNOSIS — D225 Melanocytic nevi of trunk: Secondary | ICD-10-CM | POA: Diagnosis not present

## 2020-12-11 DIAGNOSIS — L648 Other androgenic alopecia: Secondary | ICD-10-CM | POA: Diagnosis not present

## 2020-12-11 DIAGNOSIS — L304 Erythema intertrigo: Secondary | ICD-10-CM | POA: Diagnosis not present

## 2020-12-11 DIAGNOSIS — L814 Other melanin hyperpigmentation: Secondary | ICD-10-CM | POA: Diagnosis not present

## 2020-12-11 DIAGNOSIS — L821 Other seborrheic keratosis: Secondary | ICD-10-CM | POA: Diagnosis not present

## 2020-12-17 DIAGNOSIS — I8311 Varicose veins of right lower extremity with inflammation: Secondary | ICD-10-CM | POA: Diagnosis not present

## 2020-12-17 DIAGNOSIS — M7981 Nontraumatic hematoma of soft tissue: Secondary | ICD-10-CM | POA: Diagnosis not present

## 2020-12-18 DIAGNOSIS — I2699 Other pulmonary embolism without acute cor pulmonale: Secondary | ICD-10-CM | POA: Diagnosis not present

## 2020-12-18 DIAGNOSIS — E785 Hyperlipidemia, unspecified: Secondary | ICD-10-CM | POA: Diagnosis not present

## 2020-12-18 DIAGNOSIS — I10 Essential (primary) hypertension: Secondary | ICD-10-CM | POA: Diagnosis not present

## 2020-12-18 DIAGNOSIS — E119 Type 2 diabetes mellitus without complications: Secondary | ICD-10-CM | POA: Diagnosis not present

## 2020-12-18 DIAGNOSIS — I8390 Asymptomatic varicose veins of unspecified lower extremity: Secondary | ICD-10-CM | POA: Diagnosis not present

## 2020-12-18 DIAGNOSIS — I480 Paroxysmal atrial fibrillation: Secondary | ICD-10-CM | POA: Diagnosis not present

## 2020-12-18 DIAGNOSIS — I472 Ventricular tachycardia: Secondary | ICD-10-CM | POA: Diagnosis not present

## 2020-12-31 ENCOUNTER — Other Ambulatory Visit: Payer: Self-pay | Admitting: Obstetrics and Gynecology

## 2020-12-31 DIAGNOSIS — Z1231 Encounter for screening mammogram for malignant neoplasm of breast: Secondary | ICD-10-CM

## 2020-12-31 DIAGNOSIS — R1011 Right upper quadrant pain: Secondary | ICD-10-CM | POA: Diagnosis not present

## 2020-12-31 DIAGNOSIS — K760 Fatty (change of) liver, not elsewhere classified: Secondary | ICD-10-CM | POA: Diagnosis not present

## 2020-12-31 DIAGNOSIS — E6609 Other obesity due to excess calories: Secondary | ICD-10-CM | POA: Diagnosis not present

## 2021-01-02 DIAGNOSIS — R739 Hyperglycemia, unspecified: Secondary | ICD-10-CM | POA: Diagnosis not present

## 2021-01-02 DIAGNOSIS — E782 Mixed hyperlipidemia: Secondary | ICD-10-CM | POA: Diagnosis not present

## 2021-01-02 DIAGNOSIS — I10 Essential (primary) hypertension: Secondary | ICD-10-CM | POA: Diagnosis not present

## 2021-01-02 DIAGNOSIS — E7849 Other hyperlipidemia: Secondary | ICD-10-CM | POA: Diagnosis not present

## 2021-01-02 DIAGNOSIS — E119 Type 2 diabetes mellitus without complications: Secondary | ICD-10-CM | POA: Diagnosis not present

## 2021-01-02 DIAGNOSIS — E039 Hypothyroidism, unspecified: Secondary | ICD-10-CM | POA: Diagnosis not present

## 2021-01-05 ENCOUNTER — Other Ambulatory Visit: Payer: Self-pay

## 2021-01-05 ENCOUNTER — Ambulatory Visit: Payer: Medicare PPO

## 2021-01-05 ENCOUNTER — Ambulatory Visit
Admission: RE | Admit: 2021-01-05 | Discharge: 2021-01-05 | Disposition: A | Discharge status: Home / Self Care | Payer: Medicare PPO | Source: Ambulatory Visit | Attending: Obstetrics and Gynecology | Admitting: Obstetrics and Gynecology

## 2021-01-05 DIAGNOSIS — Z1231 Encounter for screening mammogram for malignant neoplasm of breast: Secondary | ICD-10-CM

## 2021-01-06 DIAGNOSIS — E119 Type 2 diabetes mellitus without complications: Secondary | ICD-10-CM | POA: Diagnosis not present

## 2021-01-06 DIAGNOSIS — Z23 Encounter for immunization: Secondary | ICD-10-CM | POA: Diagnosis not present

## 2021-01-06 DIAGNOSIS — E7849 Other hyperlipidemia: Secondary | ICD-10-CM | POA: Diagnosis not present

## 2021-01-06 DIAGNOSIS — Z0001 Encounter for general adult medical examination with abnormal findings: Secondary | ICD-10-CM | POA: Diagnosis not present

## 2021-01-06 DIAGNOSIS — R1011 Right upper quadrant pain: Secondary | ICD-10-CM | POA: Diagnosis not present

## 2021-01-06 DIAGNOSIS — K760 Fatty (change of) liver, not elsewhere classified: Secondary | ICD-10-CM | POA: Diagnosis not present

## 2021-01-06 DIAGNOSIS — Z86718 Personal history of other venous thrombosis and embolism: Secondary | ICD-10-CM | POA: Diagnosis not present

## 2021-01-06 DIAGNOSIS — Z86711 Personal history of pulmonary embolism: Secondary | ICD-10-CM | POA: Diagnosis not present

## 2021-01-07 DIAGNOSIS — I8312 Varicose veins of left lower extremity with inflammation: Secondary | ICD-10-CM | POA: Diagnosis not present

## 2021-02-18 DIAGNOSIS — H0019 Chalazion unspecified eye, unspecified eyelid: Secondary | ICD-10-CM | POA: Diagnosis not present

## 2021-02-18 DIAGNOSIS — Z6841 Body Mass Index (BMI) 40.0 and over, adult: Secondary | ICD-10-CM | POA: Diagnosis not present

## 2021-03-11 DIAGNOSIS — H0019 Chalazion unspecified eye, unspecified eyelid: Secondary | ICD-10-CM | POA: Diagnosis not present

## 2021-03-11 DIAGNOSIS — Z6841 Body Mass Index (BMI) 40.0 and over, adult: Secondary | ICD-10-CM | POA: Diagnosis not present

## 2021-03-16 DIAGNOSIS — H1045 Other chronic allergic conjunctivitis: Secondary | ICD-10-CM | POA: Diagnosis not present

## 2021-03-19 DIAGNOSIS — E119 Type 2 diabetes mellitus without complications: Secondary | ICD-10-CM | POA: Diagnosis not present

## 2021-03-19 DIAGNOSIS — I480 Paroxysmal atrial fibrillation: Secondary | ICD-10-CM | POA: Diagnosis not present

## 2021-03-19 DIAGNOSIS — I472 Ventricular tachycardia: Secondary | ICD-10-CM | POA: Diagnosis not present

## 2021-03-19 DIAGNOSIS — E785 Hyperlipidemia, unspecified: Secondary | ICD-10-CM | POA: Diagnosis not present

## 2021-03-19 DIAGNOSIS — I8390 Asymptomatic varicose veins of unspecified lower extremity: Secondary | ICD-10-CM | POA: Diagnosis not present

## 2021-03-19 DIAGNOSIS — I2699 Other pulmonary embolism without acute cor pulmonale: Secondary | ICD-10-CM | POA: Diagnosis not present

## 2021-03-19 DIAGNOSIS — I10 Essential (primary) hypertension: Secondary | ICD-10-CM | POA: Diagnosis not present

## 2021-05-27 ENCOUNTER — Other Ambulatory Visit: Payer: Self-pay

## 2021-05-27 ENCOUNTER — Emergency Department (HOSPITAL_COMMUNITY): Payer: Medicare PPO

## 2021-05-27 ENCOUNTER — Encounter (HOSPITAL_COMMUNITY): Payer: Self-pay | Admitting: Emergency Medicine

## 2021-05-27 ENCOUNTER — Emergency Department (HOSPITAL_COMMUNITY)
Admission: EM | Admit: 2021-05-27 | Discharge: 2021-05-27 | Disposition: A | Discharge status: Home / Self Care | Payer: Medicare PPO | Attending: Student | Admitting: Student

## 2021-05-27 DIAGNOSIS — Z79899 Other long term (current) drug therapy: Secondary | ICD-10-CM | POA: Insufficient documentation

## 2021-05-27 DIAGNOSIS — I10 Essential (primary) hypertension: Secondary | ICD-10-CM | POA: Diagnosis not present

## 2021-05-27 DIAGNOSIS — Z7901 Long term (current) use of anticoagulants: Secondary | ICD-10-CM | POA: Diagnosis not present

## 2021-05-27 DIAGNOSIS — Z7984 Long term (current) use of oral hypoglycemic drugs: Secondary | ICD-10-CM | POA: Insufficient documentation

## 2021-05-27 DIAGNOSIS — I4892 Unspecified atrial flutter: Secondary | ICD-10-CM | POA: Insufficient documentation

## 2021-05-27 DIAGNOSIS — E119 Type 2 diabetes mellitus without complications: Secondary | ICD-10-CM | POA: Insufficient documentation

## 2021-05-27 DIAGNOSIS — R002 Palpitations: Secondary | ICD-10-CM | POA: Diagnosis present

## 2021-05-27 LAB — CBC WITH DIFFERENTIAL/PLATELET
Abs Immature Granulocytes: 0.02 10*3/uL (ref 0.00–0.07)
Basophils Absolute: 0 10*3/uL (ref 0.0–0.1)
Basophils Relative: 0 %
Eosinophils Absolute: 0.1 10*3/uL (ref 0.0–0.5)
Eosinophils Relative: 1 %
HCT: 43.6 % (ref 36.0–46.0)
Hemoglobin: 14.4 g/dL (ref 12.0–15.0)
Immature Granulocytes: 0 %
Lymphocytes Relative: 24 %
Lymphs Abs: 1.8 10*3/uL (ref 0.7–4.0)
MCH: 30.4 pg (ref 26.0–34.0)
MCHC: 33 g/dL (ref 30.0–36.0)
MCV: 92 fL (ref 80.0–100.0)
Monocytes Absolute: 0.4 10*3/uL (ref 0.1–1.0)
Monocytes Relative: 6 %
Neutro Abs: 5.1 10*3/uL (ref 1.7–7.7)
Neutrophils Relative %: 69 %
Platelets: 229 10*3/uL (ref 150–400)
RBC: 4.74 MIL/uL (ref 3.87–5.11)
RDW: 13.2 % (ref 11.5–15.5)
WBC: 7.4 10*3/uL (ref 4.0–10.5)
nRBC: 0 % (ref 0.0–0.2)

## 2021-05-27 LAB — COMPREHENSIVE METABOLIC PANEL
ALT: 27 U/L (ref 0–44)
AST: 27 U/L (ref 15–41)
Albumin: 3.7 g/dL (ref 3.5–5.0)
Alkaline Phosphatase: 52 U/L (ref 38–126)
Anion gap: 12 (ref 5–15)
BUN: 15 mg/dL (ref 8–23)
CO2: 25 mmol/L (ref 22–32)
Calcium: 9.7 mg/dL (ref 8.9–10.3)
Chloride: 103 mmol/L (ref 98–111)
Creatinine, Ser: 0.99 mg/dL (ref 0.44–1.00)
GFR, Estimated: >60 mL/min (ref >60)
Glucose, Bld: 154 mg/dL — ABNORMAL HIGH (ref 70–99)
Potassium: 3.9 mmol/L (ref 3.5–5.1)
Sodium: 140 mmol/L (ref 135–145)
Total Bilirubin: 0.3 mg/dL (ref 0.3–1.2)
Total Protein: 6.7 g/dL (ref 6.5–8.1)

## 2021-05-27 LAB — TROPONIN I (HIGH SENSITIVITY)
Troponin I (High Sensitivity): 8 ng/L (ref <18)
Troponin I (High Sensitivity): 12 ng/L (ref <18)

## 2021-05-27 LAB — MAGNESIUM: Magnesium: 1.9 mg/dL (ref 1.7–2.4)

## 2021-05-27 LAB — PROTIME-INR
INR: 1.1 (ref 0.8–1.2)
Prothrombin Time: 14 seconds (ref 11.4–15.2)

## 2021-05-27 LAB — TSH: TSH: 2.231 u[IU]/mL (ref 0.350–4.500)

## 2021-05-27 MED ORDER — HYDROXYZINE HCL 25 MG PO TABS
25.0000 mg | ORAL_TABLET | Freq: Three times a day (TID) | ORAL | 0 refills | Status: AC | PRN
Start: 2021-05-27 — End: ?

## 2021-05-27 NOTE — Discharge Instructions (Addendum)
Your repeat EKG shows that you have gone back into your normal sinus rhythm.  Please follow-up with Dr. Terrence Dupont. He may make new recommendations for your medications/amiodarone however there is no need to change [place] emergently.  You may always return to the ER for any new or concerning symptoms.  Please take all of your medications as prescribed.  I have prescribed a medication called hydroxyzine you may take this at bedtime for sleep will help with this you may also take once every 8 hours as needed for anxiety.  Talk to your primary care provider about additional anxiety medications as this is a relatively weak antianxiety medication.

## 2021-05-27 NOTE — ED Triage Notes (Signed)
Pt with hx afib, states she woke up this morning feeling jittery, states it improved and she was able to do her morning routine, felt increased fatigue.
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
## 2021-05-27 NOTE — ED Notes (Signed)
Patient verbalizes understanding of discharge instructions. Prescriptions and follow-up care reviewed. Opportunity for questioning and answers were provided. Armband removed by staff, pt discharged from ED ambulatory.  

## 2021-05-27 NOTE — ED Provider Notes (Signed)
Emergency Medicine Provider Triage Evaluation Note  Stacie Marquez , a 70 y.o. female  was evaluated in triage.  Pt complains of feeling weak.  She has a history of having episodes that she describes as anxiety.  This 1 started at night, and she states that it felt like a normal one for her and got better she was able to fall back asleep however states that today she has been feeling globally weak which is abnormal for her after an episode.  She denies any pain or fevers.  She denies any medication changes.  Review of Systems  Positive: Weakness, fatigue Negative: Chest pain  Physical Exam  BP (!) 144/91 (BP Location: Right Arm)   Pulse (!) 128   Temp 98.3 F (36.8 C)   Resp 18   LMP 08/21/2017   SpO2 95%  Gen:   Awake, no distress   Resp:  Normal effort  MSK:   Moves extremities without difficulty  Other:  Heart rate is slightly elevated.  Patient is awake and alert, answers questions appropriately without difficulty.  Medical Decision Making  Medically screening exam initiated at 5:52 PM.  Appropriate orders placed.  Pola Corn Gallion was informed that the remainder of the evaluation will be completed by another provider, this initial triage assessment does not replace that evaluation, and the importance of remaining in the ED until their evaluation is complete.  Note: Portions of this report may have been transcribed using voice recognition software. Every effort was made to ensure accuracy; however, inadvertent computerized transcription errors may be present    Lorin Glass, PA-C 05/27/21 1753    Valarie Merino, MD 05/27/21 [phone]

## 2021-08-29 ENCOUNTER — Encounter (HOSPITAL_BASED_OUTPATIENT_CLINIC_OR_DEPARTMENT_OTHER): Payer: Self-pay | Admitting: *Deleted

## 2021-08-29 ENCOUNTER — Emergency Department (HOSPITAL_BASED_OUTPATIENT_CLINIC_OR_DEPARTMENT_OTHER): Payer: Medicare PPO

## 2021-08-29 ENCOUNTER — Emergency Department (HOSPITAL_BASED_OUTPATIENT_CLINIC_OR_DEPARTMENT_OTHER)
Admission: EM | Admit: 2021-08-29 | Discharge: 2021-08-29 | Disposition: A | Discharge status: Home / Self Care | Payer: Medicare PPO | Attending: Emergency Medicine | Admitting: Emergency Medicine

## 2021-08-29 ENCOUNTER — Other Ambulatory Visit: Payer: Self-pay

## 2021-08-29 DIAGNOSIS — R0602 Shortness of breath: Secondary | ICD-10-CM

## 2021-08-29 DIAGNOSIS — I480 Paroxysmal atrial fibrillation: Secondary | ICD-10-CM

## 2021-08-29 DIAGNOSIS — J069 Acute upper respiratory infection, unspecified: Secondary | ICD-10-CM | POA: Diagnosis not present

## 2021-08-29 DIAGNOSIS — E119 Type 2 diabetes mellitus without complications: Secondary | ICD-10-CM | POA: Diagnosis not present

## 2021-08-29 HISTORY — DX: Unspecified atrial fibrillation: I48.91

## 2021-08-29 HISTORY — DX: Malignant (primary) neoplasm, unspecified: C80.1

## 2021-08-29 HISTORY — DX: Acute embolism and thrombosis of unspecified deep veins of unspecified lower extremity: I82.409

## 2021-08-29 HISTORY — DX: Type 2 diabetes mellitus without complications: E11.9

## 2021-08-29 HISTORY — DX: Essential (primary) hypertension: I10

## 2021-08-29 HISTORY — DX: Personal history of pulmonary embolism: Z86.711

## 2021-08-29 LAB — COMPREHENSIVE METABOLIC PANEL
ALT: 30 U/L (ref 0–44)
AST: 21 U/L (ref 15–41)
Albumin: 3.9 g/dL (ref 3.5–5.0)
Alkaline Phosphatase: 43 U/L (ref 38–126)
Anion gap: 12 (ref 5–15)
BUN: 29 mg/dL — ABNORMAL HIGH (ref 8–23)
CO2: 24 mmol/L (ref 22–32)
Calcium: 9.3 mg/dL (ref 8.9–10.3)
Chloride: 102 mmol/L (ref 98–111)
Creatinine, Ser: 1.4 mg/dL — ABNORMAL HIGH (ref 0.44–1.00)
GFR, Estimated: 40 mL/min — ABNORMAL LOW (ref >60)
Glucose, Bld: 126 mg/dL — ABNORMAL HIGH (ref 70–99)
Potassium: 3.5 mmol/L (ref 3.5–5.1)
Sodium: 138 mmol/L (ref 135–145)
Total Bilirubin: 0.4 mg/dL (ref 0.3–1.2)
Total Protein: 6.7 g/dL (ref 6.5–8.1)

## 2021-08-29 LAB — CBC WITH DIFFERENTIAL/PLATELET
Abs Immature Granulocytes: 0.03 10*3/uL (ref 0.00–0.07)
Basophils Absolute: 0 10*3/uL (ref 0.0–0.1)
Basophils Relative: 0 %
Eosinophils Absolute: 0.1 10*3/uL (ref 0.0–0.5)
Eosinophils Relative: 1 %
HCT: 45.2 % (ref 36.0–46.0)
Hemoglobin: 15.2 g/dL — ABNORMAL HIGH (ref 12.0–15.0)
Immature Granulocytes: 0 %
Lymphocytes Relative: 29 %
Lymphs Abs: 2.5 10*3/uL (ref 0.7–4.0)
MCH: 30 pg (ref 26.0–34.0)
MCHC: 33.6 g/dL (ref 30.0–36.0)
MCV: 89.3 fL (ref 80.0–100.0)
Monocytes Absolute: 0.7 10*3/uL (ref 0.1–1.0)
Monocytes Relative: 8 %
Neutro Abs: 5.4 10*3/uL (ref 1.7–7.7)
Neutrophils Relative %: 62 %
Platelets: 259 10*3/uL (ref 150–400)
RBC: 5.06 MIL/uL (ref 3.87–5.11)
RDW: 13.2 % (ref 11.5–15.5)
WBC: 8.7 10*3/uL (ref 4.0–10.5)
nRBC: 0 % (ref 0.0–0.2)

## 2021-08-29 LAB — TROPONIN I (HIGH SENSITIVITY)
Troponin I (High Sensitivity): 9 ng/L (ref <18)
Troponin I (High Sensitivity): 11 ng/L (ref <18)

## 2021-08-29 MED ORDER — HYDROCODONE BIT-HOMATROP MBR 5-1.5 MG/5ML PO SOLN: 5.0000 mL | Freq: Four times a day (QID) | ORAL | 0 refills | Status: AC | PRN

## 2021-08-29 MED ORDER — METOPROLOL TARTRATE 5 MG/5ML IV SOLN
5.0000 mg | Freq: Once | INTRAVENOUS | Status: AC
  Administered 2021-08-29: 5 mg via INTRAVENOUS
  Filled 2021-08-29: qty 5

## 2021-08-29 MED ORDER — HYDROCODONE BIT-HOMATROP MBR 5-1.5 MG/5ML PO SOLN: 5.0000 mL | Freq: Four times a day (QID) | ORAL | 0 refills | Status: DC | PRN

## 2021-08-29 MED ORDER — ALBUTEROL SULFATE HFA 108 (90 BASE) MCG/ACT IN AERS: 2.0000 | INHALATION_SPRAY | RESPIRATORY_TRACT | Status: DC | PRN

## 2021-08-29 NOTE — ED Provider Notes (Signed)
[facility] EMERGENCY DEPT Provider Note   CSN: [id number] Arrival date & time: 08/29/21  1837     History  Chief Complaint  Patient presents with   Shortness of Breath    Stacie Marquez is a 71 y.o. female.  Pt is a 71y/o female with hx of PAF on coumadin, htn, DM, DVT/PE on anticoagulation who is presenting today with complaint of generalized malaise, fatigue and irregular heart rate.  Patient reports on 08/17/2021 she started having upper respiratory symptoms with cough, congestion, runny nose and general malaise.  Approximately 3 days later she went and saw her doctor because she was not feeling any better.  At that time they started her on prednisone, azithromycin and gave her an inhaler.  She took the prednisone and azithromycin but has not used the inhaler.  This past week because she was not feeling much better she had follow-up with her cardiologist.  At that time in the office she was in sinus rhythm but had had intermittent low heart rates.  They did discuss possibly changing her amiodarone dose but he thought it could also be due to the prednisone.  Patient reports that as the week has gone on she is really not felt much better.  The cough has improved she is not having any fever or vomiting but she noticed yesterday that her heart rate was irregular.  Sometimes it was very low and at other times it was very high.  Today when she tried to do stuff around the house she became very tired and worn out and had to rest multiple times.  She finally had a family member bring her tonight just so she could be rechecked.  When she got to the waiting room she was feeling very tired and had a rapid heart rate which resolved by the time she got back to the room.  She denies any nausea vomiting, diarrhea.  She has had no urinary symptoms.  No productive cough.  No specific chest pain.  She finished the prednisone on Thursday.  The history is provided by the patient and medical records.   Shortness of Breath     Home Medications Prior to Admission medications   Medication Sig Start Date End Date Taking? Authorizing Provider  HYDROcodone bit-homatropine (HYCODAN) 5-1.5 MG/5ML syrup Take 5 mLs by mouth every 6 (six) hours as needed for cough. 08/29/21  Yes Blanchie Dessert, MD      Allergies    Penicillins and Tape    Review of Systems   Review of Systems  Respiratory:  Positive for shortness of breath.   All other systems reviewed and are negative.  Physical Exam Updated Vital Signs BP 133/72    Pulse 68    Temp 98.1 F (36.7 C) (Oral)    Resp 15    Ht 5\' 4"  (1.626 m)    Wt 117.9 kg    SpO2 94%    BMI 44.63 kg/m  Physical Exam Vitals and nursing note reviewed.  Constitutional:      General: She is not in acute distress.    Appearance: She is well-developed.  HENT:     Head: Normocephalic and atraumatic.  Eyes:     Pupils: Pupils are equal, round, and reactive to light.  Cardiovascular:     Rate and Rhythm: Normal rate and regular rhythm. FrequentExtrasystoles are present.    Heart sounds: Normal heart sounds. No murmur heard.   No friction rub.  Pulmonary:     Effort:  Pulmonary effort is normal.     Breath sounds: Normal breath sounds. No wheezing or rales.  Abdominal:     General: Bowel sounds are normal. There is no distension.     Palpations: Abdomen is soft.     Tenderness: There is no abdominal tenderness. There is no guarding or rebound.  Musculoskeletal:        General: No tenderness. Normal range of motion.     Cervical back: Normal range of motion and neck supple.     Right lower leg: No edema.     Left lower leg: No edema.     Comments: No edema  Skin:    General: Skin is warm and dry.     Findings: No rash.  Neurological:     Mental Status: She is alert and oriented to person, place, and time. Mental status is at baseline.     Cranial Nerves: No cranial nerve deficit.  Psychiatric:        Mood and Affect: Mood normal.         Behavior: Behavior normal.    ED Results / Procedures / Treatments   Labs (all labs ordered are listed, but only abnormal results are displayed) Labs Reviewed  CBC WITH DIFFERENTIAL/PLATELET - Abnormal; Notable for the following components:      Result Value   Hemoglobin 15.2 (*)    All other components within normal limits  COMPREHENSIVE METABOLIC PANEL - Abnormal; Notable for the following components:   Glucose, Bld 126 (*)    BUN 29 (*)    Creatinine, Ser 1.40 (*)    GFR, Estimated 40 (*)    All other components within normal limits  TROPONIN I (HIGH SENSITIVITY)  TROPONIN I (HIGH SENSITIVITY)    EKG EKG Interpretation  Date/Time:  Saturday August 29 2021 19:13:26 EST Ventricular Rate:  127 PR Interval:    QRS Duration: 86 QT Interval:  190 QTC Calculation: 276 R Axis:   91 Text Interpretation: Atrial flutter with variable A-V block Rightward axis Low voltage QRS Possible Inferior infarct , age undetermined No previous tracing No previous ECGs available Confirmed by Blanchie Dessert [phone]) on 08/29/2021 7:14:17 PM  Radiology DG Chest Port 1 View  Result Date: 08/29/2021 CLINICAL DATA:  Productive cough.  Shortness of breath. EXAM: PORTABLE CHEST 1 VIEW COMPARISON:  None. FINDINGS: The heart size and mediastinal contours are within normal limits. Both lungs are clear. The visualized skeletal structures are unremarkable. IMPRESSION: No active disease. Electronically Signed   By: Dorise Bullion III M.D.   On: 08/29/2021 19:58    Procedures Procedures    Medications Ordered in ED Medications  albuterol (VENTOLIN HFA) 108 (90 Base) MCG/ACT inhaler 2 puff (has no administration in time range)  metoprolol tartrate (LOPRESSOR) injection 5 mg (5 mg Intravenous Given 08/29/21 2215)    ED Course/ Medical Decision Making/ A&P                           Medical Decision Making Amount and/or Complexity of Data Reviewed External Data Reviewed: labs, ECG and notes. Labs:  ordered. Decision-making details documented in ED Course. Radiology: ordered and independent interpretation performed. Decision-making details documented in ED Course. ECG/medicine tests: ordered and independent interpretation performed. Decision-making details documented in ED Course.  Risk Prescription drug management.   Elderly female with multiple medical problems presenting today with general malaise and fatigue with any exertion.  She has a history of paroxysmal atrial  fibrillation upon arrival to the emergency room patient was in A. fib based on the EKG which I independently interpreted with RVR with heart rates approximately 125 -130.  Upon getting the patient into her room and sitting into the bed she converted back to sinus rhythm with frequent PACs.  She is able to speak in full sentences and appears in no acute distress.  Oxygen saturation is 96% on room air.  She is not having any wheezing on exam and does not have significant findings for fluid overload at this time.  Patient is anticoagulated and low suspicion for PE today.  I independently evaluated and interpreted patient's labs and her CBC, CMP, delta troponin are all within normal limits today.  Normal electrolytes.  I independently interpreted and reviewed patient's chest x-ray without acute findings today.  On repeat evaluation patient remains in sinus rhythm with no acute findings.  Recommended discontinuing all prednisone at this time.  Taking her other medications as prescribed and speaking with Dr. Terrence Dupont on Monday if they want to do any specific changes to her amiodarone or place her on a monitor to do longer evaluation of her heart rate.  10:47 PM On repeat evaluation patient has gone back into atrial fibrillation after being in sinus rhythm.  Rates ranged from 67Y systolic up to 195.  She was given 1 IV dose of Lopressor with improvement of heart rate to the 80s.  She had not taken her amiodarone today but took a dose while  she was here.  Given patient is going in and out of atrial fibrillation I did discuss with her and her family member about cardioversion however is possible she could go right back into atrial fibrillation after cardioversion.  Discussed the risks and benefits and at this time patient would like to go home, continue to take her medication and continue her anticoagulation and follow-up with Dr. Terrence Dupont on Monday.  She was given return precautions.  She is well-appearing otherwise.  Appears stable for discharge.         Final Clinical Impression(s) / ED Diagnoses Final diagnoses:  Viral upper respiratory tract infection  Paroxysmal atrial fibrillation ([place])    Rx / DC Orders ED Discharge Orders          Ordered    HYDROcodone bit-homatropine (HYCODAN) 5-1.5 MG/5ML syrup  Every 6 hours PRN        08/29/21 2245              Blanchie Dessert, MD 08/29/21 2249

## 2021-08-29 NOTE — Discharge Instructions (Signed)
All your lab work today looks good.  You are going in and out of atrial fibrillation which is probably what causing you to feel tired and weak when you get up and try to do things.  It may be related to the prednisone you have been on recently.  Continue to take your amiodarone.  Take 100 mg if the heart rate is normal between 80 and 90.  If it is 120-140 take 2 and if it was low less then 60 hold the medication.  If you are continuing to have irregular rates follow-up with Dr. Terrence Dupont on Monday.  If over the rest of this weekend you start feeling worse or the heart rate does not improve with the medication or you are starting to pass out or have chest pain return to the emergency room.  Rest the rest of the weekend and do not push yourself too hard.

## 2021-08-29 NOTE — ED Triage Notes (Addendum)
C/o productive cough and short of breath. Onset ~ 12/26. Treated with zpack and prednisone. Taking prednisone which seems to have triggered RVR of her afib. Takes eliquis and amiodarone. Seen by cardiologist recently. Endorses fatigue. Denies pain, NVD, or fever

## 2021-09-01 ENCOUNTER — Encounter (HOSPITAL_BASED_OUTPATIENT_CLINIC_OR_DEPARTMENT_OTHER): Payer: Self-pay | Admitting: *Deleted

## 2021-10-26 DIAGNOSIS — I10 Essential (primary) hypertension: Secondary | ICD-10-CM | POA: Diagnosis not present

## 2021-10-26 DIAGNOSIS — E7849 Other hyperlipidemia: Secondary | ICD-10-CM | POA: Diagnosis not present

## 2021-10-26 DIAGNOSIS — E782 Mixed hyperlipidemia: Secondary | ICD-10-CM | POA: Diagnosis not present

## 2021-10-26 DIAGNOSIS — E119 Type 2 diabetes mellitus without complications: Secondary | ICD-10-CM | POA: Diagnosis not present

## 2021-10-26 DIAGNOSIS — E7801 Familial hypercholesterolemia: Secondary | ICD-10-CM | POA: Diagnosis not present

## 2021-10-26 DIAGNOSIS — E7800 Pure hypercholesterolemia, unspecified: Secondary | ICD-10-CM | POA: Diagnosis not present

## 2021-10-28 ENCOUNTER — Other Ambulatory Visit (HOSPITAL_COMMUNITY): Payer: Self-pay | Admitting: General Practice

## 2021-10-28 DIAGNOSIS — Z86711 Personal history of pulmonary embolism: Secondary | ICD-10-CM | POA: Diagnosis not present

## 2021-10-28 DIAGNOSIS — R1011 Right upper quadrant pain: Secondary | ICD-10-CM | POA: Diagnosis not present

## 2021-10-28 DIAGNOSIS — Z6841 Body Mass Index (BMI) 40.0 and over, adult: Secondary | ICD-10-CM | POA: Diagnosis not present

## 2021-10-28 DIAGNOSIS — K760 Fatty (change of) liver, not elsewhere classified: Secondary | ICD-10-CM | POA: Diagnosis not present

## 2021-10-28 DIAGNOSIS — F419 Anxiety disorder, unspecified: Secondary | ICD-10-CM | POA: Diagnosis not present

## 2021-10-28 DIAGNOSIS — E7849 Other hyperlipidemia: Secondary | ICD-10-CM | POA: Diagnosis not present

## 2021-10-28 DIAGNOSIS — E119 Type 2 diabetes mellitus without complications: Secondary | ICD-10-CM | POA: Diagnosis not present

## 2021-10-28 DIAGNOSIS — E782 Mixed hyperlipidemia: Secondary | ICD-10-CM | POA: Diagnosis not present

## 2021-10-28 DIAGNOSIS — Z86718 Personal history of other venous thrombosis and embolism: Secondary | ICD-10-CM | POA: Diagnosis not present

## 2021-10-28 DIAGNOSIS — E2839 Other primary ovarian failure: Secondary | ICD-10-CM

## 2021-10-30 DIAGNOSIS — I83892 Varicose veins of left lower extremities with other complications: Secondary | ICD-10-CM | POA: Diagnosis not present

## 2021-11-03 ENCOUNTER — Ambulatory Visit (HOSPITAL_COMMUNITY)
Admission: RE | Admit: 2021-11-03 | Discharge: 2021-11-03 | Disposition: A | Discharge status: Home / Self Care | Payer: Medicare PPO | Source: Ambulatory Visit | Attending: General Practice | Admitting: General Practice

## 2021-11-03 ENCOUNTER — Other Ambulatory Visit: Payer: Self-pay

## 2021-11-03 DIAGNOSIS — E2839 Other primary ovarian failure: Secondary | ICD-10-CM | POA: Diagnosis not present

## 2021-11-03 DIAGNOSIS — Z780 Asymptomatic menopausal state: Secondary | ICD-10-CM | POA: Diagnosis not present

## 2021-11-03 DIAGNOSIS — M8589 Other specified disorders of bone density and structure, multiple sites: Secondary | ICD-10-CM | POA: Diagnosis not present

## 2021-11-04 DIAGNOSIS — I83813 Varicose veins of bilateral lower extremities with pain: Secondary | ICD-10-CM | POA: Diagnosis not present

## 2021-11-04 DIAGNOSIS — Z09 Encounter for follow-up examination after completed treatment for conditions other than malignant neoplasm: Secondary | ICD-10-CM | POA: Diagnosis not present

## 2021-11-09 DIAGNOSIS — I83812 Varicose veins of left lower extremities with pain: Secondary | ICD-10-CM | POA: Diagnosis not present

## 2021-11-17 DIAGNOSIS — I83811 Varicose veins of right lower extremities with pain: Secondary | ICD-10-CM | POA: Diagnosis not present

## 2021-11-17 DIAGNOSIS — M7989 Other specified soft tissue disorders: Secondary | ICD-10-CM | POA: Diagnosis not present

## 2021-11-17 DIAGNOSIS — I83891 Varicose veins of right lower extremities with other complications: Secondary | ICD-10-CM | POA: Diagnosis not present

## 2021-11-24 DIAGNOSIS — I10 Essential (primary) hypertension: Secondary | ICD-10-CM | POA: Diagnosis not present

## 2021-11-24 DIAGNOSIS — I480 Paroxysmal atrial fibrillation: Secondary | ICD-10-CM | POA: Diagnosis not present

## 2021-11-24 DIAGNOSIS — E119 Type 2 diabetes mellitus without complications: Secondary | ICD-10-CM | POA: Diagnosis not present

## 2021-11-24 DIAGNOSIS — E785 Hyperlipidemia, unspecified: Secondary | ICD-10-CM | POA: Diagnosis not present

## 2021-12-02 DIAGNOSIS — I83892 Varicose veins of left lower extremities with other complications: Secondary | ICD-10-CM | POA: Diagnosis not present

## 2021-12-02 DIAGNOSIS — I87392 Chronic venous hypertension (idiopathic) with other complications of left lower extremity: Secondary | ICD-10-CM | POA: Diagnosis not present

## 2021-12-09 ENCOUNTER — Other Ambulatory Visit: Payer: Self-pay | Admitting: Family Medicine

## 2021-12-09 DIAGNOSIS — Z1231 Encounter for screening mammogram for malignant neoplasm of breast: Secondary | ICD-10-CM

## 2021-12-10 DIAGNOSIS — D225 Melanocytic nevi of trunk: Secondary | ICD-10-CM | POA: Diagnosis not present

## 2021-12-10 DIAGNOSIS — L921 Necrobiosis lipoidica, not elsewhere classified: Secondary | ICD-10-CM | POA: Diagnosis not present

## 2021-12-10 DIAGNOSIS — L821 Other seborrheic keratosis: Secondary | ICD-10-CM | POA: Diagnosis not present

## 2021-12-10 DIAGNOSIS — L304 Erythema intertrigo: Secondary | ICD-10-CM | POA: Diagnosis not present

## 2021-12-10 DIAGNOSIS — L814 Other melanin hyperpigmentation: Secondary | ICD-10-CM | POA: Diagnosis not present

## 2021-12-10 DIAGNOSIS — L298 Other pruritus: Secondary | ICD-10-CM | POA: Diagnosis not present

## 2021-12-10 DIAGNOSIS — L820 Inflamed seborrheic keratosis: Secondary | ICD-10-CM | POA: Diagnosis not present

## 2021-12-10 DIAGNOSIS — R208 Other disturbances of skin sensation: Secondary | ICD-10-CM | POA: Diagnosis not present

## 2021-12-11 DIAGNOSIS — I83811 Varicose veins of right lower extremities with pain: Secondary | ICD-10-CM | POA: Diagnosis not present

## 2021-12-11 DIAGNOSIS — M7989 Other specified soft tissue disorders: Secondary | ICD-10-CM | POA: Diagnosis not present

## 2021-12-11 DIAGNOSIS — I83891 Varicose veins of right lower extremities with other complications: Secondary | ICD-10-CM | POA: Diagnosis not present

## 2021-12-15 DIAGNOSIS — I10 Essential (primary) hypertension: Secondary | ICD-10-CM | POA: Diagnosis not present

## 2021-12-15 DIAGNOSIS — J40 Bronchitis, not specified as acute or chronic: Secondary | ICD-10-CM | POA: Diagnosis not present

## 2021-12-15 DIAGNOSIS — J329 Chronic sinusitis, unspecified: Secondary | ICD-10-CM | POA: Diagnosis not present

## 2021-12-15 DIAGNOSIS — Z6841 Body Mass Index (BMI) 40.0 and over, adult: Secondary | ICD-10-CM | POA: Diagnosis not present

## 2021-12-30 DIAGNOSIS — I83812 Varicose veins of left lower extremities with pain: Secondary | ICD-10-CM | POA: Diagnosis not present

## 2021-12-30 DIAGNOSIS — I83892 Varicose veins of left lower extremities with other complications: Secondary | ICD-10-CM | POA: Diagnosis not present

## 2021-12-30 DIAGNOSIS — M7989 Other specified soft tissue disorders: Secondary | ICD-10-CM | POA: Diagnosis not present

## 2022-01-06 ENCOUNTER — Ambulatory Visit
Admission: RE | Admit: 2022-01-06 | Discharge: 2022-01-06 | Disposition: A | Discharge status: Home / Self Care | Payer: Medicare PPO | Source: Ambulatory Visit | Attending: Family Medicine | Admitting: Family Medicine

## 2022-01-06 DIAGNOSIS — Z1231 Encounter for screening mammogram for malignant neoplasm of breast: Secondary | ICD-10-CM | POA: Diagnosis not present

## 2022-01-27 DIAGNOSIS — I87391 Chronic venous hypertension (idiopathic) with other complications of right lower extremity: Secondary | ICD-10-CM | POA: Diagnosis not present

## 2022-01-27 DIAGNOSIS — I83891 Varicose veins of right lower extremities with other complications: Secondary | ICD-10-CM | POA: Diagnosis not present

## 2022-01-28 DIAGNOSIS — E7801 Familial hypercholesterolemia: Secondary | ICD-10-CM | POA: Diagnosis not present

## 2022-01-28 DIAGNOSIS — E039 Hypothyroidism, unspecified: Secondary | ICD-10-CM | POA: Diagnosis not present

## 2022-01-28 DIAGNOSIS — E119 Type 2 diabetes mellitus without complications: Secondary | ICD-10-CM | POA: Diagnosis not present

## 2022-01-28 DIAGNOSIS — K760 Fatty (change of) liver, not elsewhere classified: Secondary | ICD-10-CM | POA: Diagnosis not present

## 2022-01-28 DIAGNOSIS — E7800 Pure hypercholesterolemia, unspecified: Secondary | ICD-10-CM | POA: Diagnosis not present

## 2022-02-01 DIAGNOSIS — E119 Type 2 diabetes mellitus without complications: Secondary | ICD-10-CM | POA: Diagnosis not present

## 2022-02-01 DIAGNOSIS — I480 Paroxysmal atrial fibrillation: Secondary | ICD-10-CM | POA: Diagnosis not present

## 2022-02-01 DIAGNOSIS — I10 Essential (primary) hypertension: Secondary | ICD-10-CM | POA: Diagnosis not present

## 2022-02-01 DIAGNOSIS — E785 Hyperlipidemia, unspecified: Secondary | ICD-10-CM | POA: Diagnosis not present

## 2022-02-02 DIAGNOSIS — Z86711 Personal history of pulmonary embolism: Secondary | ICD-10-CM | POA: Diagnosis not present

## 2022-02-02 DIAGNOSIS — E119 Type 2 diabetes mellitus without complications: Secondary | ICD-10-CM | POA: Diagnosis not present

## 2022-02-02 DIAGNOSIS — K760 Fatty (change of) liver, not elsewhere classified: Secondary | ICD-10-CM | POA: Diagnosis not present

## 2022-02-02 DIAGNOSIS — R1011 Right upper quadrant pain: Secondary | ICD-10-CM | POA: Diagnosis not present

## 2022-02-02 DIAGNOSIS — Z86718 Personal history of other venous thrombosis and embolism: Secondary | ICD-10-CM | POA: Diagnosis not present

## 2022-02-02 DIAGNOSIS — F419 Anxiety disorder, unspecified: Secondary | ICD-10-CM | POA: Diagnosis not present

## 2022-02-02 DIAGNOSIS — Z6841 Body Mass Index (BMI) 40.0 and over, adult: Secondary | ICD-10-CM | POA: Diagnosis not present

## 2022-02-02 DIAGNOSIS — R030 Elevated blood-pressure reading, without diagnosis of hypertension: Secondary | ICD-10-CM | POA: Diagnosis not present

## 2022-02-02 DIAGNOSIS — E7849 Other hyperlipidemia: Secondary | ICD-10-CM | POA: Diagnosis not present

## 2022-02-14 ENCOUNTER — Encounter (HOSPITAL_BASED_OUTPATIENT_CLINIC_OR_DEPARTMENT_OTHER): Payer: Self-pay

## 2022-02-14 ENCOUNTER — Emergency Department (HOSPITAL_BASED_OUTPATIENT_CLINIC_OR_DEPARTMENT_OTHER)
Admission: EM | Admit: 2022-02-14 | Discharge: 2022-02-14 | Disposition: A | Discharge status: Home / Self Care | Payer: Medicare PPO | Attending: Student | Admitting: Student

## 2022-02-14 ENCOUNTER — Emergency Department (HOSPITAL_BASED_OUTPATIENT_CLINIC_OR_DEPARTMENT_OTHER): Payer: Medicare PPO

## 2022-02-14 ENCOUNTER — Other Ambulatory Visit: Payer: Self-pay

## 2022-02-14 DIAGNOSIS — I517 Cardiomegaly: Secondary | ICD-10-CM | POA: Insufficient documentation

## 2022-02-14 DIAGNOSIS — I10 Essential (primary) hypertension: Secondary | ICD-10-CM | POA: Diagnosis not present

## 2022-02-14 DIAGNOSIS — I4891 Unspecified atrial fibrillation: Secondary | ICD-10-CM | POA: Insufficient documentation

## 2022-02-14 DIAGNOSIS — Z8589 Personal history of malignant neoplasm of other organs and systems: Secondary | ICD-10-CM | POA: Insufficient documentation

## 2022-02-14 DIAGNOSIS — Z86718 Personal history of other venous thrombosis and embolism: Secondary | ICD-10-CM | POA: Diagnosis not present

## 2022-02-14 DIAGNOSIS — R0602 Shortness of breath: Secondary | ICD-10-CM | POA: Diagnosis not present

## 2022-02-14 DIAGNOSIS — Z7984 Long term (current) use of oral hypoglycemic drugs: Secondary | ICD-10-CM | POA: Diagnosis not present

## 2022-02-14 DIAGNOSIS — Z7901 Long term (current) use of anticoagulants: Secondary | ICD-10-CM | POA: Insufficient documentation

## 2022-02-14 DIAGNOSIS — E119 Type 2 diabetes mellitus without complications: Secondary | ICD-10-CM | POA: Insufficient documentation

## 2022-02-14 DIAGNOSIS — R000 Tachycardia, unspecified: Secondary | ICD-10-CM | POA: Diagnosis present

## 2022-02-14 DIAGNOSIS — J811 Chronic pulmonary edema: Secondary | ICD-10-CM | POA: Diagnosis not present

## 2022-02-14 LAB — BASIC METABOLIC PANEL
Anion gap: 11 (ref 5–15)
BUN: 21 mg/dL (ref 8–23)
CO2: 24 mmol/L (ref 22–32)
Calcium: 9.9 mg/dL (ref 8.9–10.3)
Chloride: 106 mmol/L (ref 98–111)
Creatinine, Ser: 0.84 mg/dL (ref 0.44–1.00)
GFR, Estimated: >60 mL/min (ref >60)
Glucose, Bld: 134 mg/dL — ABNORMAL HIGH (ref 70–99)
Potassium: 3.6 mmol/L (ref 3.5–5.1)
Sodium: 141 mmol/L (ref 135–145)

## 2022-02-14 LAB — CBC
HCT: 39.5 % (ref 36.0–46.0)
Hemoglobin: 13.4 g/dL (ref 12.0–15.0)
MCH: 29.1 pg (ref 26.0–34.0)
MCHC: 33.9 g/dL (ref 30.0–36.0)
MCV: 85.9 fL (ref 80.0–100.0)
Platelets: 196 10*3/uL (ref 150–400)
RBC: 4.6 MIL/uL (ref 3.87–5.11)
RDW: 12 % (ref 11.5–15.5)
WBC: 6.2 10*3/uL (ref 4.0–10.5)
nRBC: 0 % (ref 0.0–0.2)

## 2022-02-14 LAB — TROPONIN I (HIGH SENSITIVITY): Troponin I (High Sensitivity): 12 ng/L (ref <18)

## 2022-02-14 LAB — BRAIN NATRIURETIC PEPTIDE: B Natriuretic Peptide: 56.6 pg/mL (ref 0.0–100.0)

## 2022-02-14 MED ORDER — METOPROLOL SUCCINATE ER 25 MG PO TB24: 12.5000 mg | ORAL_TABLET | Freq: Every day | ORAL | 0 refills | Status: AC

## 2022-02-14 MED ORDER — METOPROLOL TARTRATE 5 MG/5ML IV SOLN
2.5000 mg | Freq: Once | INTRAVENOUS | Status: AC
  Administered 2022-02-14: 2.5 mg via INTRAVENOUS
  Filled 2022-02-14: qty 5

## 2022-03-09 DIAGNOSIS — I87392 Chronic venous hypertension (idiopathic) with other complications of left lower extremity: Secondary | ICD-10-CM | POA: Diagnosis not present

## 2022-03-09 DIAGNOSIS — I83892 Varicose veins of left lower extremities with other complications: Secondary | ICD-10-CM | POA: Diagnosis not present

## 2022-03-15 DIAGNOSIS — L820 Inflamed seborrheic keratosis: Secondary | ICD-10-CM | POA: Diagnosis not present

## 2022-03-15 DIAGNOSIS — L538 Other specified erythematous conditions: Secondary | ICD-10-CM | POA: Diagnosis not present

## 2022-03-15 DIAGNOSIS — R208 Other disturbances of skin sensation: Secondary | ICD-10-CM | POA: Diagnosis not present

## 2022-03-22 DIAGNOSIS — I480 Paroxysmal atrial fibrillation: Secondary | ICD-10-CM | POA: Diagnosis not present

## 2022-04-14 DIAGNOSIS — R350 Frequency of micturition: Secondary | ICD-10-CM | POA: Diagnosis not present

## 2022-04-14 DIAGNOSIS — N760 Acute vaginitis: Secondary | ICD-10-CM | POA: Diagnosis not present

## 2022-04-14 DIAGNOSIS — Z6841 Body Mass Index (BMI) 40.0 and over, adult: Secondary | ICD-10-CM | POA: Diagnosis not present

## 2022-04-14 DIAGNOSIS — R030 Elevated blood-pressure reading, without diagnosis of hypertension: Secondary | ICD-10-CM | POA: Diagnosis not present

## 2022-04-29 DIAGNOSIS — Z86711 Personal history of pulmonary embolism: Secondary | ICD-10-CM | POA: Diagnosis not present

## 2022-04-29 DIAGNOSIS — E7801 Familial hypercholesterolemia: Secondary | ICD-10-CM | POA: Diagnosis not present

## 2022-04-29 DIAGNOSIS — R739 Hyperglycemia, unspecified: Secondary | ICD-10-CM | POA: Diagnosis not present

## 2022-04-29 DIAGNOSIS — E7849 Other hyperlipidemia: Secondary | ICD-10-CM | POA: Diagnosis not present

## 2022-04-29 DIAGNOSIS — E119 Type 2 diabetes mellitus without complications: Secondary | ICD-10-CM | POA: Diagnosis not present

## 2022-04-29 DIAGNOSIS — I10 Essential (primary) hypertension: Secondary | ICD-10-CM | POA: Diagnosis not present

## 2022-05-04 DIAGNOSIS — K760 Fatty (change of) liver, not elsewhere classified: Secondary | ICD-10-CM | POA: Diagnosis not present

## 2022-05-04 DIAGNOSIS — E7849 Other hyperlipidemia: Secondary | ICD-10-CM | POA: Diagnosis not present

## 2022-05-04 DIAGNOSIS — Z86711 Personal history of pulmonary embolism: Secondary | ICD-10-CM | POA: Diagnosis not present

## 2022-05-04 DIAGNOSIS — Z6841 Body Mass Index (BMI) 40.0 and over, adult: Secondary | ICD-10-CM | POA: Diagnosis not present

## 2022-05-04 DIAGNOSIS — E782 Mixed hyperlipidemia: Secondary | ICD-10-CM | POA: Diagnosis not present

## 2022-05-04 DIAGNOSIS — Z86718 Personal history of other venous thrombosis and embolism: Secondary | ICD-10-CM | POA: Diagnosis not present

## 2022-05-04 DIAGNOSIS — E119 Type 2 diabetes mellitus without complications: Secondary | ICD-10-CM | POA: Diagnosis not present

## 2022-05-04 DIAGNOSIS — F419 Anxiety disorder, unspecified: Secondary | ICD-10-CM | POA: Diagnosis not present

## 2022-05-04 DIAGNOSIS — R1011 Right upper quadrant pain: Secondary | ICD-10-CM | POA: Diagnosis not present

## 2022-05-10 DIAGNOSIS — I10 Essential (primary) hypertension: Secondary | ICD-10-CM | POA: Diagnosis not present

## 2022-05-10 DIAGNOSIS — E119 Type 2 diabetes mellitus without complications: Secondary | ICD-10-CM | POA: Diagnosis not present

## 2022-05-10 DIAGNOSIS — I2699 Other pulmonary embolism without acute cor pulmonale: Secondary | ICD-10-CM | POA: Diagnosis not present

## 2022-05-10 DIAGNOSIS — I480 Paroxysmal atrial fibrillation: Secondary | ICD-10-CM | POA: Diagnosis not present

## 2022-05-10 DIAGNOSIS — E785 Hyperlipidemia, unspecified: Secondary | ICD-10-CM | POA: Diagnosis not present

## 2022-05-28 DIAGNOSIS — I480 Paroxysmal atrial fibrillation: Secondary | ICD-10-CM | POA: Diagnosis not present

## 2022-05-28 DIAGNOSIS — I517 Cardiomegaly: Secondary | ICD-10-CM | POA: Diagnosis not present

## 2022-05-28 DIAGNOSIS — R918 Other nonspecific abnormal finding of lung field: Secondary | ICD-10-CM | POA: Diagnosis not present

## 2022-05-28 DIAGNOSIS — R911 Solitary pulmonary nodule: Secondary | ICD-10-CM | POA: Diagnosis not present

## 2022-06-02 DIAGNOSIS — E119 Type 2 diabetes mellitus without complications: Secondary | ICD-10-CM | POA: Diagnosis not present

## 2022-06-02 DIAGNOSIS — I10 Essential (primary) hypertension: Secondary | ICD-10-CM | POA: Diagnosis not present

## 2022-06-02 DIAGNOSIS — I480 Paroxysmal atrial fibrillation: Secondary | ICD-10-CM | POA: Diagnosis not present

## 2022-07-16 DIAGNOSIS — I4891 Unspecified atrial fibrillation: Secondary | ICD-10-CM | POA: Diagnosis not present

## 2022-07-16 DIAGNOSIS — I480 Paroxysmal atrial fibrillation: Secondary | ICD-10-CM | POA: Diagnosis not present

## 2022-07-16 DIAGNOSIS — I10 Essential (primary) hypertension: Secondary | ICD-10-CM | POA: Diagnosis not present

## 2022-07-16 DIAGNOSIS — E119 Type 2 diabetes mellitus without complications: Secondary | ICD-10-CM | POA: Diagnosis not present

## 2022-07-30 DIAGNOSIS — E119 Type 2 diabetes mellitus without complications: Secondary | ICD-10-CM | POA: Diagnosis not present

## 2022-07-30 DIAGNOSIS — E7849 Other hyperlipidemia: Secondary | ICD-10-CM | POA: Diagnosis not present

## 2022-07-30 DIAGNOSIS — R5383 Other fatigue: Secondary | ICD-10-CM | POA: Diagnosis not present

## 2022-07-30 DIAGNOSIS — E782 Mixed hyperlipidemia: Secondary | ICD-10-CM | POA: Diagnosis not present

## 2022-08-03 DIAGNOSIS — I87393 Chronic venous hypertension (idiopathic) with other complications of bilateral lower extremity: Secondary | ICD-10-CM | POA: Diagnosis not present

## 2022-08-03 DIAGNOSIS — I8312 Varicose veins of left lower extremity with inflammation: Secondary | ICD-10-CM | POA: Diagnosis not present

## 2022-08-03 DIAGNOSIS — I8311 Varicose veins of right lower extremity with inflammation: Secondary | ICD-10-CM | POA: Diagnosis not present

## 2022-08-04 DIAGNOSIS — Z86711 Personal history of pulmonary embolism: Secondary | ICD-10-CM | POA: Diagnosis not present

## 2022-08-04 DIAGNOSIS — Z86718 Personal history of other venous thrombosis and embolism: Secondary | ICD-10-CM | POA: Diagnosis not present

## 2022-08-04 DIAGNOSIS — Z780 Asymptomatic menopausal state: Secondary | ICD-10-CM | POA: Diagnosis not present

## 2022-08-04 DIAGNOSIS — R1011 Right upper quadrant pain: Secondary | ICD-10-CM | POA: Diagnosis not present

## 2022-08-04 DIAGNOSIS — K760 Fatty (change of) liver, not elsewhere classified: Secondary | ICD-10-CM | POA: Diagnosis not present

## 2022-08-04 DIAGNOSIS — E7849 Other hyperlipidemia: Secondary | ICD-10-CM | POA: Diagnosis not present

## 2022-08-04 DIAGNOSIS — E119 Type 2 diabetes mellitus without complications: Secondary | ICD-10-CM | POA: Diagnosis not present

## 2022-08-04 DIAGNOSIS — M8580 Other specified disorders of bone density and structure, unspecified site: Secondary | ICD-10-CM | POA: Diagnosis not present

## 2022-08-04 DIAGNOSIS — Z23 Encounter for immunization: Secondary | ICD-10-CM | POA: Diagnosis not present

## 2022-10-04 DIAGNOSIS — Z03818 Encounter for observation for suspected exposure to other biological agents ruled out: Secondary | ICD-10-CM | POA: Diagnosis not present

## 2022-10-04 DIAGNOSIS — J22 Unspecified acute lower respiratory infection: Secondary | ICD-10-CM | POA: Diagnosis not present

## 2022-10-04 DIAGNOSIS — J019 Acute sinusitis, unspecified: Secondary | ICD-10-CM | POA: Diagnosis not present

## 2022-10-29 DIAGNOSIS — Z86718 Personal history of other venous thrombosis and embolism: Secondary | ICD-10-CM | POA: Diagnosis not present

## 2022-10-29 DIAGNOSIS — I10 Essential (primary) hypertension: Secondary | ICD-10-CM | POA: Diagnosis not present

## 2022-10-29 DIAGNOSIS — E7801 Familial hypercholesterolemia: Secondary | ICD-10-CM | POA: Diagnosis not present

## 2022-10-29 DIAGNOSIS — R739 Hyperglycemia, unspecified: Secondary | ICD-10-CM | POA: Diagnosis not present

## 2022-10-29 DIAGNOSIS — E7849 Other hyperlipidemia: Secondary | ICD-10-CM | POA: Diagnosis not present

## 2022-10-29 DIAGNOSIS — Z86711 Personal history of pulmonary embolism: Secondary | ICD-10-CM | POA: Diagnosis not present

## 2022-10-29 DIAGNOSIS — E039 Hypothyroidism, unspecified: Secondary | ICD-10-CM | POA: Diagnosis not present

## 2022-10-29 DIAGNOSIS — E119 Type 2 diabetes mellitus without complications: Secondary | ICD-10-CM | POA: Diagnosis not present

## 2022-11-03 DIAGNOSIS — Z86711 Personal history of pulmonary embolism: Secondary | ICD-10-CM | POA: Diagnosis not present

## 2022-11-03 DIAGNOSIS — M8580 Other specified disorders of bone density and structure, unspecified site: Secondary | ICD-10-CM | POA: Diagnosis not present

## 2022-11-03 DIAGNOSIS — F419 Anxiety disorder, unspecified: Secondary | ICD-10-CM | POA: Diagnosis not present

## 2022-11-03 DIAGNOSIS — R1011 Right upper quadrant pain: Secondary | ICD-10-CM | POA: Diagnosis not present

## 2022-11-03 DIAGNOSIS — E119 Type 2 diabetes mellitus without complications: Secondary | ICD-10-CM | POA: Diagnosis not present

## 2022-11-03 DIAGNOSIS — K760 Fatty (change of) liver, not elsewhere classified: Secondary | ICD-10-CM | POA: Diagnosis not present

## 2022-11-03 DIAGNOSIS — Z86718 Personal history of other venous thrombosis and embolism: Secondary | ICD-10-CM | POA: Diagnosis not present

## 2022-11-03 DIAGNOSIS — E782 Mixed hyperlipidemia: Secondary | ICD-10-CM | POA: Diagnosis not present

## 2022-11-03 DIAGNOSIS — E7849 Other hyperlipidemia: Secondary | ICD-10-CM | POA: Diagnosis not present

## 2022-11-15 DIAGNOSIS — I10 Essential (primary) hypertension: Secondary | ICD-10-CM | POA: Diagnosis not present

## 2022-11-15 DIAGNOSIS — I480 Paroxysmal atrial fibrillation: Secondary | ICD-10-CM | POA: Diagnosis not present

## 2022-11-15 DIAGNOSIS — Z1330 Encounter for screening examination for mental health and behavioral disorders, unspecified: Secondary | ICD-10-CM | POA: Diagnosis not present

## 2022-11-15 DIAGNOSIS — E119 Type 2 diabetes mellitus without complications: Secondary | ICD-10-CM | POA: Diagnosis not present

## 2022-11-15 DIAGNOSIS — I4891 Unspecified atrial fibrillation: Secondary | ICD-10-CM | POA: Diagnosis not present

## 2022-11-23 ENCOUNTER — Other Ambulatory Visit: Payer: Self-pay | Admitting: Family Medicine

## 2022-11-23 DIAGNOSIS — Z0000 Encounter for general adult medical examination without abnormal findings: Secondary | ICD-10-CM

## 2022-11-30 DIAGNOSIS — Z1211 Encounter for screening for malignant neoplasm of colon: Secondary | ICD-10-CM | POA: Diagnosis not present

## 2022-11-30 DIAGNOSIS — Z1212 Encounter for screening for malignant neoplasm of rectum: Secondary | ICD-10-CM | POA: Diagnosis not present

## 2022-12-15 DIAGNOSIS — E6609 Other obesity due to excess calories: Secondary | ICD-10-CM | POA: Diagnosis not present

## 2022-12-15 DIAGNOSIS — L718 Other rosacea: Secondary | ICD-10-CM | POA: Diagnosis not present

## 2022-12-15 DIAGNOSIS — L304 Erythema intertrigo: Secondary | ICD-10-CM | POA: Diagnosis not present

## 2022-12-15 DIAGNOSIS — L921 Necrobiosis lipoidica, not elsewhere classified: Secondary | ICD-10-CM | POA: Diagnosis not present

## 2022-12-15 DIAGNOSIS — L820 Inflamed seborrheic keratosis: Secondary | ICD-10-CM | POA: Diagnosis not present

## 2022-12-15 DIAGNOSIS — D225 Melanocytic nevi of trunk: Secondary | ICD-10-CM | POA: Diagnosis not present

## 2022-12-15 DIAGNOSIS — L720 Epidermal cyst: Secondary | ICD-10-CM | POA: Diagnosis not present

## 2022-12-15 DIAGNOSIS — L918 Other hypertrophic disorders of the skin: Secondary | ICD-10-CM | POA: Diagnosis not present

## 2022-12-15 DIAGNOSIS — L814 Other melanin hyperpigmentation: Secondary | ICD-10-CM | POA: Diagnosis not present

## 2022-12-15 DIAGNOSIS — L821 Other seborrheic keratosis: Secondary | ICD-10-CM | POA: Diagnosis not present

## 2022-12-15 DIAGNOSIS — R195 Other fecal abnormalities: Secondary | ICD-10-CM | POA: Diagnosis not present

## 2022-12-15 DIAGNOSIS — K760 Fatty (change of) liver, not elsewhere classified: Secondary | ICD-10-CM | POA: Diagnosis not present

## 2022-12-28 DIAGNOSIS — Z86010 Personal history of colonic polyps: Secondary | ICD-10-CM | POA: Diagnosis not present

## 2022-12-28 DIAGNOSIS — R195 Other fecal abnormalities: Secondary | ICD-10-CM | POA: Diagnosis not present

## 2022-12-28 DIAGNOSIS — K5730 Diverticulosis of large intestine without perforation or abscess without bleeding: Secondary | ICD-10-CM | POA: Diagnosis not present

## 2022-12-28 DIAGNOSIS — D122 Benign neoplasm of ascending colon: Secondary | ICD-10-CM | POA: Diagnosis not present

## 2022-12-28 DIAGNOSIS — K562 Volvulus: Secondary | ICD-10-CM | POA: Diagnosis not present

## 2022-12-28 DIAGNOSIS — K635 Polyp of colon: Secondary | ICD-10-CM | POA: Diagnosis not present

## 2023-01-10 ENCOUNTER — Ambulatory Visit
Admission: RE | Admit: 2023-01-10 | Discharge: 2023-01-10 | Disposition: A | Discharge status: Home / Self Care | Payer: Medicare PPO | Source: Ambulatory Visit | Attending: Family Medicine | Admitting: Family Medicine

## 2023-01-10 DIAGNOSIS — Z0000 Encounter for general adult medical examination without abnormal findings: Secondary | ICD-10-CM

## 2023-01-10 DIAGNOSIS — Z1231 Encounter for screening mammogram for malignant neoplasm of breast: Secondary | ICD-10-CM | POA: Diagnosis not present

## 2023-01-27 DIAGNOSIS — I10 Essential (primary) hypertension: Secondary | ICD-10-CM | POA: Diagnosis not present

## 2023-01-27 DIAGNOSIS — R739 Hyperglycemia, unspecified: Secondary | ICD-10-CM | POA: Diagnosis not present

## 2023-01-27 DIAGNOSIS — E7849 Other hyperlipidemia: Secondary | ICD-10-CM | POA: Diagnosis not present

## 2023-01-27 DIAGNOSIS — E119 Type 2 diabetes mellitus without complications: Secondary | ICD-10-CM | POA: Diagnosis not present

## 2023-01-27 DIAGNOSIS — E7801 Familial hypercholesterolemia: Secondary | ICD-10-CM | POA: Diagnosis not present

## 2023-02-01 DIAGNOSIS — K760 Fatty (change of) liver, not elsewhere classified: Secondary | ICD-10-CM | POA: Diagnosis not present

## 2023-02-01 DIAGNOSIS — Z86718 Personal history of other venous thrombosis and embolism: Secondary | ICD-10-CM | POA: Diagnosis not present

## 2023-02-01 DIAGNOSIS — M8580 Other specified disorders of bone density and structure, unspecified site: Secondary | ICD-10-CM | POA: Diagnosis not present

## 2023-02-01 DIAGNOSIS — E119 Type 2 diabetes mellitus without complications: Secondary | ICD-10-CM | POA: Diagnosis not present

## 2023-02-01 DIAGNOSIS — F419 Anxiety disorder, unspecified: Secondary | ICD-10-CM | POA: Diagnosis not present

## 2023-02-01 DIAGNOSIS — E7849 Other hyperlipidemia: Secondary | ICD-10-CM | POA: Diagnosis not present

## 2023-02-01 DIAGNOSIS — Z86711 Personal history of pulmonary embolism: Secondary | ICD-10-CM | POA: Diagnosis not present

## 2023-02-01 DIAGNOSIS — R1011 Right upper quadrant pain: Secondary | ICD-10-CM | POA: Diagnosis not present

## 2023-02-02 DIAGNOSIS — L819 Disorder of pigmentation, unspecified: Secondary | ICD-10-CM | POA: Diagnosis not present

## 2023-02-02 DIAGNOSIS — I8312 Varicose veins of left lower extremity with inflammation: Secondary | ICD-10-CM | POA: Diagnosis not present

## 2023-02-02 DIAGNOSIS — I87393 Chronic venous hypertension (idiopathic) with other complications of bilateral lower extremity: Secondary | ICD-10-CM | POA: Diagnosis not present

## 2023-02-02 DIAGNOSIS — I8311 Varicose veins of right lower extremity with inflammation: Secondary | ICD-10-CM | POA: Diagnosis not present

## 2023-02-03 DIAGNOSIS — E7849 Other hyperlipidemia: Secondary | ICD-10-CM | POA: Diagnosis not present

## 2023-02-03 DIAGNOSIS — E039 Hypothyroidism, unspecified: Secondary | ICD-10-CM | POA: Diagnosis not present

## 2023-02-03 DIAGNOSIS — E119 Type 2 diabetes mellitus without complications: Secondary | ICD-10-CM | POA: Diagnosis not present

## 2023-02-03 DIAGNOSIS — I10 Essential (primary) hypertension: Secondary | ICD-10-CM | POA: Diagnosis not present

## 2023-02-03 DIAGNOSIS — Z0001 Encounter for general adult medical examination with abnormal findings: Secondary | ICD-10-CM | POA: Diagnosis not present

## 2023-05-03 DIAGNOSIS — R5383 Other fatigue: Secondary | ICD-10-CM | POA: Diagnosis not present

## 2023-05-03 DIAGNOSIS — E7801 Familial hypercholesterolemia: Secondary | ICD-10-CM | POA: Diagnosis not present

## 2023-05-03 DIAGNOSIS — E782 Mixed hyperlipidemia: Secondary | ICD-10-CM | POA: Diagnosis not present

## 2023-05-03 DIAGNOSIS — I10 Essential (primary) hypertension: Secondary | ICD-10-CM | POA: Diagnosis not present

## 2023-05-03 DIAGNOSIS — E7800 Pure hypercholesterolemia, unspecified: Secondary | ICD-10-CM | POA: Diagnosis not present

## 2023-05-03 DIAGNOSIS — E119 Type 2 diabetes mellitus without complications: Secondary | ICD-10-CM | POA: Diagnosis not present

## 2023-05-03 DIAGNOSIS — E7849 Other hyperlipidemia: Secondary | ICD-10-CM | POA: Diagnosis not present

## 2023-05-10 DIAGNOSIS — E119 Type 2 diabetes mellitus without complications: Secondary | ICD-10-CM | POA: Diagnosis not present

## 2023-05-10 DIAGNOSIS — Z86711 Personal history of pulmonary embolism: Secondary | ICD-10-CM | POA: Diagnosis not present

## 2023-05-10 DIAGNOSIS — Z86718 Personal history of other venous thrombosis and embolism: Secondary | ICD-10-CM | POA: Diagnosis not present

## 2023-05-10 DIAGNOSIS — F419 Anxiety disorder, unspecified: Secondary | ICD-10-CM | POA: Diagnosis not present

## 2023-05-10 DIAGNOSIS — R1011 Right upper quadrant pain: Secondary | ICD-10-CM | POA: Diagnosis not present

## 2023-05-10 DIAGNOSIS — E7849 Other hyperlipidemia: Secondary | ICD-10-CM | POA: Diagnosis not present

## 2023-05-10 DIAGNOSIS — K760 Fatty (change of) liver, not elsewhere classified: Secondary | ICD-10-CM | POA: Diagnosis not present

## 2023-05-10 DIAGNOSIS — M8580 Other specified disorders of bone density and structure, unspecified site: Secondary | ICD-10-CM | POA: Diagnosis not present

## 2023-05-10 DIAGNOSIS — Z780 Asymptomatic menopausal state: Secondary | ICD-10-CM | POA: Diagnosis not present

## 2023-05-31 DIAGNOSIS — Z86711 Personal history of pulmonary embolism: Secondary | ICD-10-CM | POA: Diagnosis not present

## 2023-05-31 DIAGNOSIS — E119 Type 2 diabetes mellitus without complications: Secondary | ICD-10-CM | POA: Diagnosis not present

## 2023-05-31 DIAGNOSIS — Z9889 Other specified postprocedural states: Secondary | ICD-10-CM | POA: Diagnosis not present

## 2023-05-31 DIAGNOSIS — Z8679 Personal history of other diseases of the circulatory system: Secondary | ICD-10-CM | POA: Diagnosis not present

## 2023-05-31 DIAGNOSIS — Z86718 Personal history of other venous thrombosis and embolism: Secondary | ICD-10-CM | POA: Diagnosis not present

## 2023-05-31 DIAGNOSIS — I480 Paroxysmal atrial fibrillation: Secondary | ICD-10-CM | POA: Diagnosis not present

## 2023-05-31 DIAGNOSIS — I10 Essential (primary) hypertension: Secondary | ICD-10-CM | POA: Diagnosis not present

## 2023-08-02 DIAGNOSIS — E119 Type 2 diabetes mellitus without complications: Secondary | ICD-10-CM | POA: Diagnosis not present

## 2023-08-02 DIAGNOSIS — E7849 Other hyperlipidemia: Secondary | ICD-10-CM | POA: Diagnosis not present

## 2023-08-02 DIAGNOSIS — K760 Fatty (change of) liver, not elsewhere classified: Secondary | ICD-10-CM | POA: Diagnosis not present

## 2023-08-04 DIAGNOSIS — I83891 Varicose veins of right lower extremities with other complications: Secondary | ICD-10-CM | POA: Diagnosis not present

## 2023-08-04 DIAGNOSIS — I8311 Varicose veins of right lower extremity with inflammation: Secondary | ICD-10-CM | POA: Diagnosis not present

## 2023-08-04 DIAGNOSIS — I8312 Varicose veins of left lower extremity with inflammation: Secondary | ICD-10-CM | POA: Diagnosis not present

## 2023-08-04 DIAGNOSIS — L819 Disorder of pigmentation, unspecified: Secondary | ICD-10-CM | POA: Diagnosis not present

## 2023-08-04 DIAGNOSIS — I872 Venous insufficiency (chronic) (peripheral): Secondary | ICD-10-CM | POA: Diagnosis not present

## 2023-08-10 DIAGNOSIS — Z780 Asymptomatic menopausal state: Secondary | ICD-10-CM | POA: Diagnosis not present

## 2023-08-10 DIAGNOSIS — M8580 Other specified disorders of bone density and structure, unspecified site: Secondary | ICD-10-CM | POA: Diagnosis not present

## 2023-08-10 DIAGNOSIS — E7849 Other hyperlipidemia: Secondary | ICD-10-CM | POA: Diagnosis not present

## 2023-08-10 DIAGNOSIS — E119 Type 2 diabetes mellitus without complications: Secondary | ICD-10-CM | POA: Diagnosis not present

## 2023-08-10 DIAGNOSIS — F419 Anxiety disorder, unspecified: Secondary | ICD-10-CM | POA: Diagnosis not present

## 2023-08-10 DIAGNOSIS — K760 Fatty (change of) liver, not elsewhere classified: Secondary | ICD-10-CM | POA: Diagnosis not present

## 2023-08-10 DIAGNOSIS — I4891 Unspecified atrial fibrillation: Secondary | ICD-10-CM | POA: Diagnosis not present

## 2023-08-10 DIAGNOSIS — Z86718 Personal history of other venous thrombosis and embolism: Secondary | ICD-10-CM | POA: Diagnosis not present

## 2023-09-06 IMAGING — MG MM DIGITAL SCREENING BILAT W/ TOMO AND CAD
6 of 12 series · 6 of 36 positions shown · non-contrast
Comparison: Previous exam(s).

CLINICAL DATA: Screening.

EXAM:
DIGITAL SCREENING BILATERAL MAMMOGRAM WITH TOMOSYNTHESIS AND CAD
TECHNIQUE: Bilateral screening digital craniocaudal and mediolateral oblique
mammograms were obtained. Bilateral screening digital breast
tomosynthesis was performed. The images were evaluated with
computer-aided detection.

[L MLO synth-2D (1 of 2)]
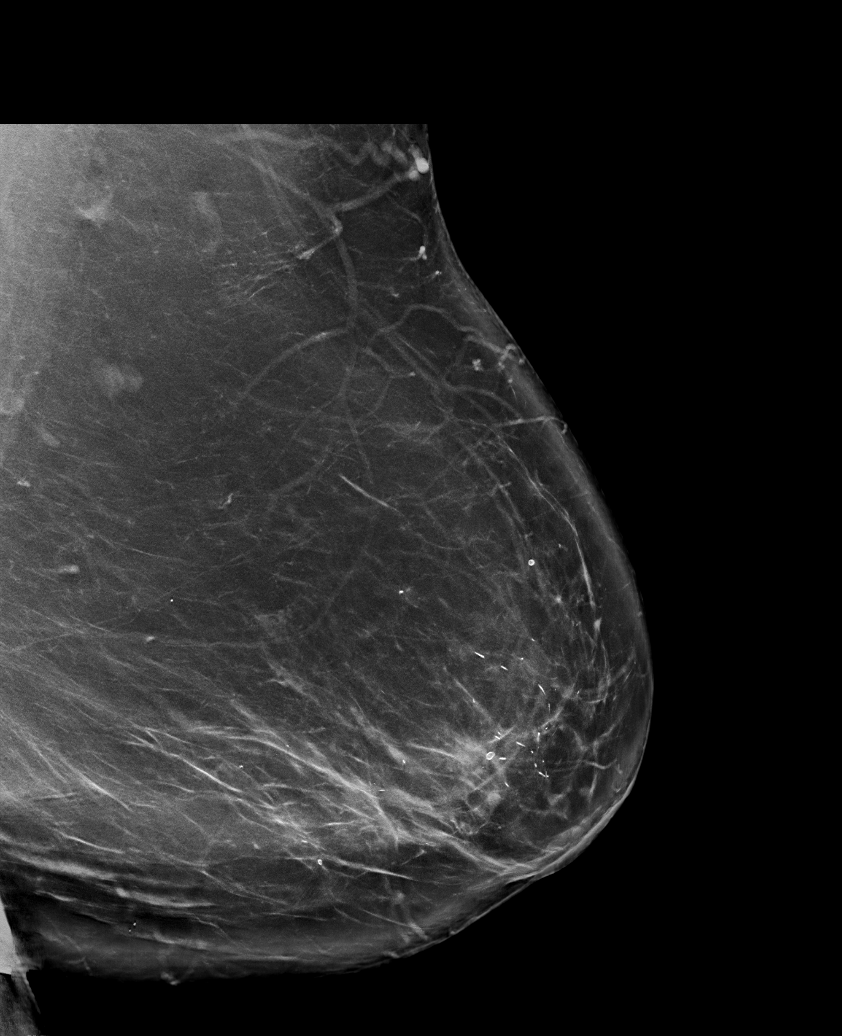

[R CC synth-2D]
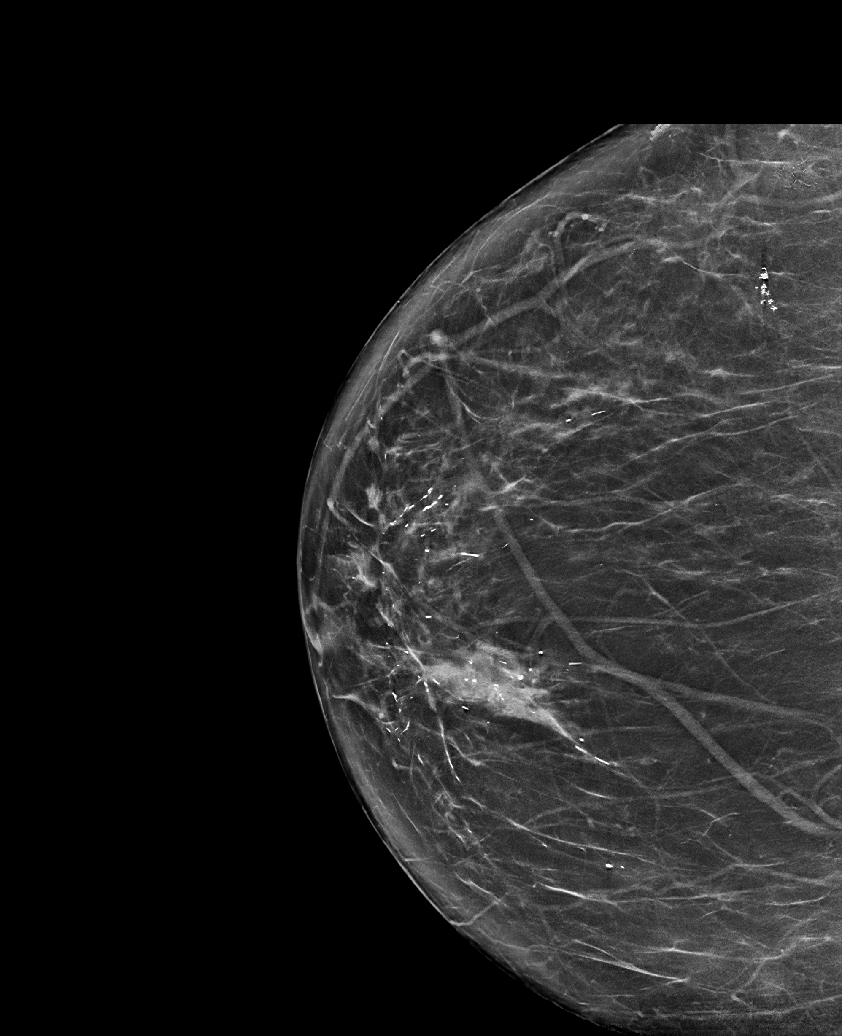

[L MLO synth-2D (2 of 2)]
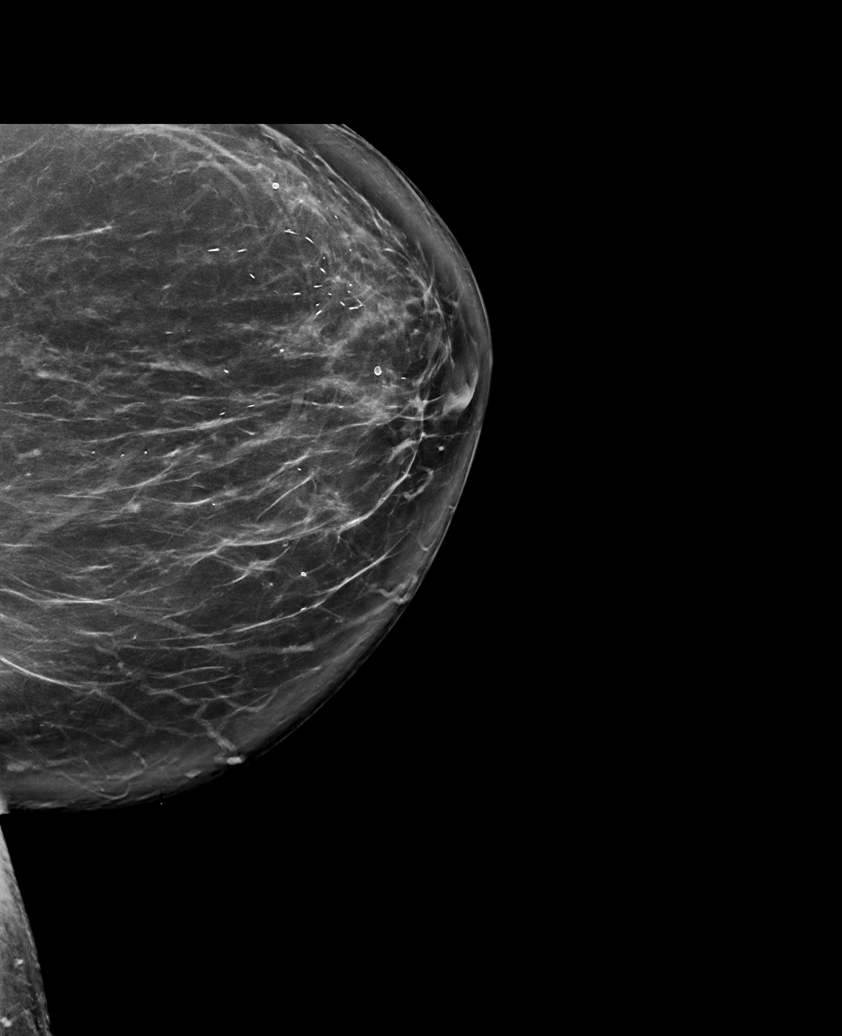

[R MLO synth-2D]
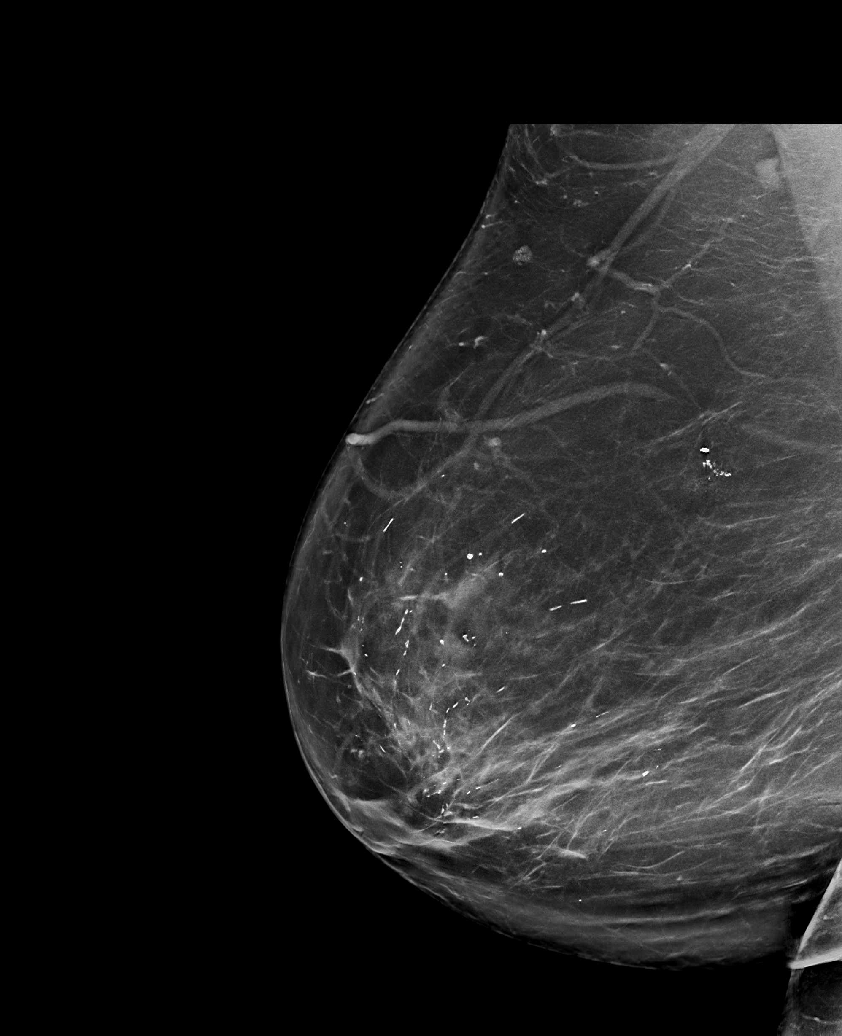

[L CC synth-2D]
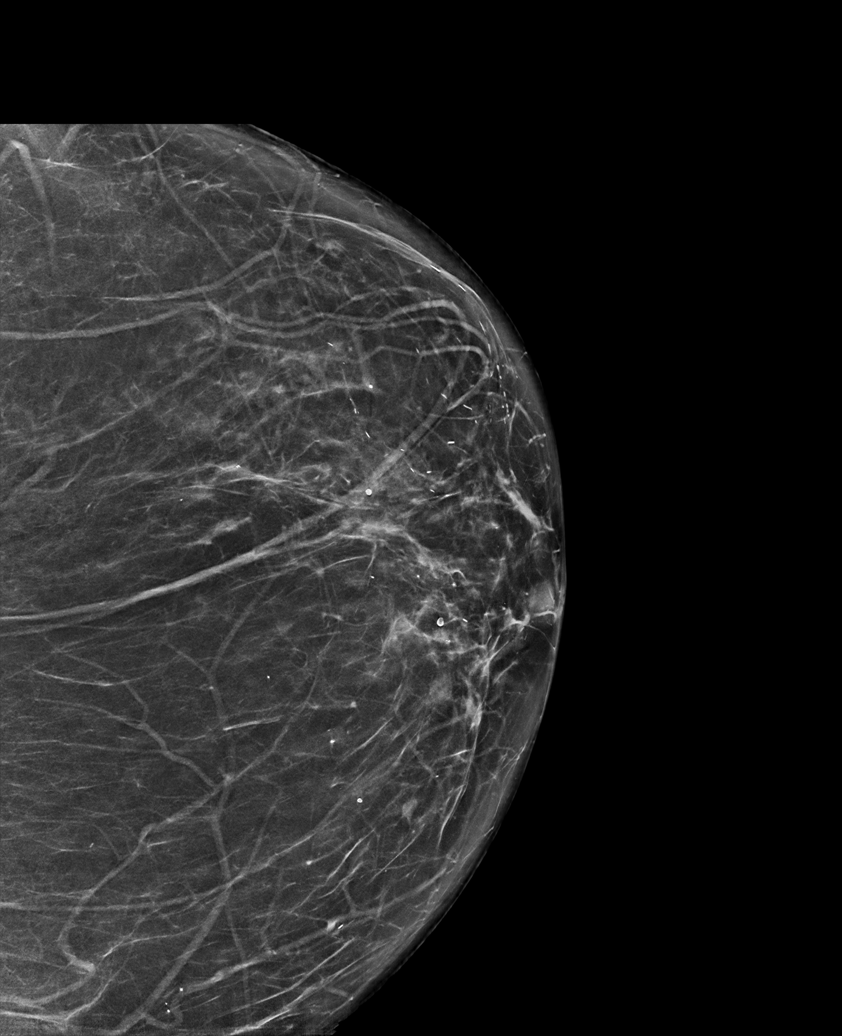

[R CV synth-2D]
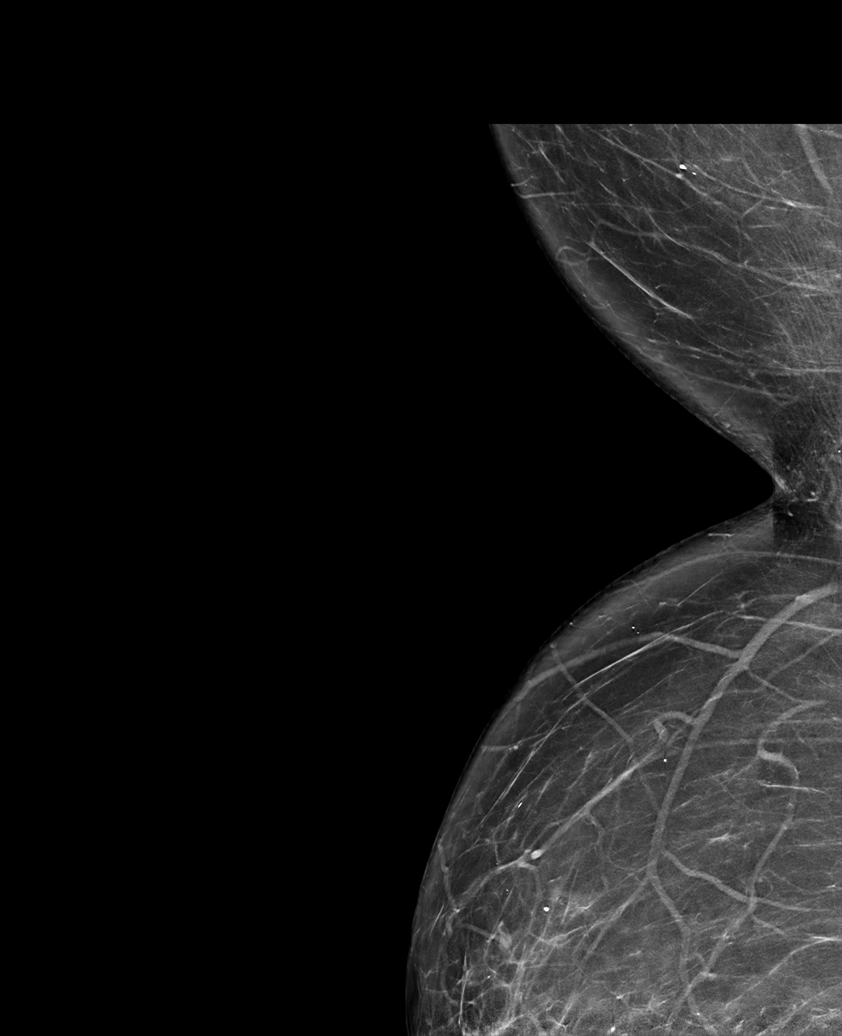

[6 of 36 positions shown; findings below may reference images not displayed]

ACR Breast Density Category b: There are scattered areas of
fibroglandular density.
FINDINGS: There are no findings suspicious for malignancy.
IMPRESSION: No mammographic evidence of malignancy. A result letter of this
screening mammogram will be mailed directly to the patient.

RECOMMENDATION:
Screening mammogram in one year. (Code:51-O-LD2)

BI-RADS CATEGORY  1: Negative.

## 2023-09-26 DIAGNOSIS — I872 Venous insufficiency (chronic) (peripheral): Secondary | ICD-10-CM | POA: Diagnosis not present

## 2023-10-05 DIAGNOSIS — I83811 Varicose veins of right lower extremities with pain: Secondary | ICD-10-CM | POA: Diagnosis not present

## 2023-10-05 DIAGNOSIS — Z09 Encounter for follow-up examination after completed treatment for conditions other than malignant neoplasm: Secondary | ICD-10-CM | POA: Diagnosis not present

## 2023-11-03 DIAGNOSIS — K760 Fatty (change of) liver, not elsewhere classified: Secondary | ICD-10-CM | POA: Diagnosis not present

## 2023-11-03 DIAGNOSIS — Z86718 Personal history of other venous thrombosis and embolism: Secondary | ICD-10-CM | POA: Diagnosis not present

## 2023-11-03 DIAGNOSIS — Z86711 Personal history of pulmonary embolism: Secondary | ICD-10-CM | POA: Diagnosis not present

## 2023-11-03 DIAGNOSIS — E7849 Other hyperlipidemia: Secondary | ICD-10-CM | POA: Diagnosis not present

## 2023-11-09 ENCOUNTER — Other Ambulatory Visit: Payer: Self-pay | Admitting: Family Medicine

## 2023-11-09 DIAGNOSIS — Z1231 Encounter for screening mammogram for malignant neoplasm of breast: Secondary | ICD-10-CM

## 2023-11-09 DIAGNOSIS — K760 Fatty (change of) liver, not elsewhere classified: Secondary | ICD-10-CM | POA: Diagnosis not present

## 2023-11-09 DIAGNOSIS — R1011 Right upper quadrant pain: Secondary | ICD-10-CM | POA: Diagnosis not present

## 2023-11-09 DIAGNOSIS — E1165 Type 2 diabetes mellitus with hyperglycemia: Secondary | ICD-10-CM | POA: Diagnosis not present

## 2023-11-30 ENCOUNTER — Other Ambulatory Visit (HOSPITAL_COMMUNITY): Payer: Self-pay | Admitting: General Practice

## 2023-11-30 DIAGNOSIS — M810 Age-related osteoporosis without current pathological fracture: Secondary | ICD-10-CM

## 2023-12-05 DIAGNOSIS — I8311 Varicose veins of right lower extremity with inflammation: Secondary | ICD-10-CM | POA: Diagnosis not present

## 2023-12-05 DIAGNOSIS — I83891 Varicose veins of right lower extremities with other complications: Secondary | ICD-10-CM | POA: Diagnosis not present

## 2023-12-05 DIAGNOSIS — I8312 Varicose veins of left lower extremity with inflammation: Secondary | ICD-10-CM | POA: Diagnosis not present

## 2023-12-05 DIAGNOSIS — L819 Disorder of pigmentation, unspecified: Secondary | ICD-10-CM | POA: Diagnosis not present

## 2023-12-08 ENCOUNTER — Ambulatory Visit (HOSPITAL_COMMUNITY)
Admission: RE | Admit: 2023-12-08 | Discharge: 2023-12-08 | Disposition: A | Discharge status: Home / Self Care | Source: Ambulatory Visit | Attending: General Practice | Admitting: General Practice

## 2023-12-08 DIAGNOSIS — M810 Age-related osteoporosis without current pathological fracture: Secondary | ICD-10-CM | POA: Diagnosis not present

## 2023-12-08 DIAGNOSIS — Z780 Asymptomatic menopausal state: Secondary | ICD-10-CM | POA: Diagnosis not present

## 2023-12-08 DIAGNOSIS — M85852 Other specified disorders of bone density and structure, left thigh: Secondary | ICD-10-CM | POA: Diagnosis not present

## 2023-12-13 ENCOUNTER — Encounter (HOSPITAL_BASED_OUTPATIENT_CLINIC_OR_DEPARTMENT_OTHER): Payer: Self-pay

## 2023-12-13 DIAGNOSIS — Z5321 Procedure and treatment not carried out due to patient leaving prior to being seen by health care provider: Secondary | ICD-10-CM | POA: Diagnosis not present

## 2023-12-13 DIAGNOSIS — R040 Epistaxis: Secondary | ICD-10-CM | POA: Insufficient documentation

## 2023-12-13 NOTE — ED Triage Notes (Signed)
 Pt states that she began to have nose bleeds tonight around 930 PM tonight, no bleeding now

## 2023-12-14 ENCOUNTER — Emergency Department (HOSPITAL_BASED_OUTPATIENT_CLINIC_OR_DEPARTMENT_OTHER)
Admission: EM | Admit: 2023-12-14 | Discharge: 2023-12-14 | Disposition: A | Discharge status: Home / Self Care | Attending: Emergency Medicine | Admitting: Emergency Medicine

## 2023-12-15 DIAGNOSIS — L820 Inflamed seborrheic keratosis: Secondary | ICD-10-CM | POA: Diagnosis not present

## 2023-12-15 DIAGNOSIS — L739 Follicular disorder, unspecified: Secondary | ICD-10-CM | POA: Diagnosis not present

## 2023-12-15 DIAGNOSIS — D2371 Other benign neoplasm of skin of right lower limb, including hip: Secondary | ICD-10-CM | POA: Diagnosis not present

## 2023-12-15 DIAGNOSIS — L814 Other melanin hyperpigmentation: Secondary | ICD-10-CM | POA: Diagnosis not present

## 2023-12-15 DIAGNOSIS — L720 Epidermal cyst: Secondary | ICD-10-CM | POA: Diagnosis not present

## 2023-12-15 DIAGNOSIS — L304 Erythema intertrigo: Secondary | ICD-10-CM | POA: Diagnosis not present

## 2023-12-15 DIAGNOSIS — L821 Other seborrheic keratosis: Secondary | ICD-10-CM | POA: Diagnosis not present

## 2024-01-11 ENCOUNTER — Ambulatory Visit
Admission: RE | Admit: 2024-01-11 | Discharge: 2024-01-11 | Disposition: A | Discharge status: Home / Self Care | Source: Ambulatory Visit | Attending: Family Medicine | Admitting: Family Medicine

## 2024-01-11 DIAGNOSIS — Z1231 Encounter for screening mammogram for malignant neoplasm of breast: Secondary | ICD-10-CM

## 2024-02-08 DIAGNOSIS — L820 Inflamed seborrheic keratosis: Secondary | ICD-10-CM | POA: Diagnosis not present

## 2024-02-10 DIAGNOSIS — E7849 Other hyperlipidemia: Secondary | ICD-10-CM | POA: Diagnosis not present

## 2024-02-10 DIAGNOSIS — R300 Dysuria: Secondary | ICD-10-CM | POA: Diagnosis not present

## 2024-02-10 DIAGNOSIS — E119 Type 2 diabetes mellitus without complications: Secondary | ICD-10-CM | POA: Diagnosis not present

## 2024-02-10 DIAGNOSIS — E1165 Type 2 diabetes mellitus with hyperglycemia: Secondary | ICD-10-CM | POA: Diagnosis not present

## 2024-02-15 DIAGNOSIS — R1011 Right upper quadrant pain: Secondary | ICD-10-CM | POA: Diagnosis not present

## 2024-02-15 DIAGNOSIS — K760 Fatty (change of) liver, not elsewhere classified: Secondary | ICD-10-CM | POA: Diagnosis not present

## 2024-02-15 DIAGNOSIS — E1165 Type 2 diabetes mellitus with hyperglycemia: Secondary | ICD-10-CM | POA: Diagnosis not present

## 2024-02-15 DIAGNOSIS — Z6841 Body Mass Index (BMI) 40.0 and over, adult: Secondary | ICD-10-CM | POA: Diagnosis not present

## 2024-02-15 DIAGNOSIS — R059 Cough, unspecified: Secondary | ICD-10-CM | POA: Diagnosis not present

## 2024-05-18 DIAGNOSIS — E7849 Other hyperlipidemia: Secondary | ICD-10-CM | POA: Diagnosis not present

## 2024-05-18 DIAGNOSIS — R739 Hyperglycemia, unspecified: Secondary | ICD-10-CM | POA: Diagnosis not present

## 2024-05-18 DIAGNOSIS — E1165 Type 2 diabetes mellitus with hyperglycemia: Secondary | ICD-10-CM | POA: Diagnosis not present

## 2024-05-29 DIAGNOSIS — E1165 Type 2 diabetes mellitus with hyperglycemia: Secondary | ICD-10-CM | POA: Diagnosis not present

## 2024-05-29 DIAGNOSIS — E782 Mixed hyperlipidemia: Secondary | ICD-10-CM | POA: Diagnosis not present

## 2024-05-29 DIAGNOSIS — K760 Fatty (change of) liver, not elsewhere classified: Secondary | ICD-10-CM | POA: Diagnosis not present

## 2024-05-29 DIAGNOSIS — Z6841 Body Mass Index (BMI) 40.0 and over, adult: Secondary | ICD-10-CM | POA: Diagnosis not present

## 2024-06-01 DIAGNOSIS — Z86711 Personal history of pulmonary embolism: Secondary | ICD-10-CM | POA: Diagnosis not present

## 2024-06-01 DIAGNOSIS — E119 Type 2 diabetes mellitus without complications: Secondary | ICD-10-CM | POA: Diagnosis not present

## 2024-06-01 DIAGNOSIS — Z8679 Personal history of other diseases of the circulatory system: Secondary | ICD-10-CM | POA: Diagnosis not present

## 2024-06-01 DIAGNOSIS — I10 Essential (primary) hypertension: Secondary | ICD-10-CM | POA: Diagnosis not present

## 2024-06-01 DIAGNOSIS — Z9889 Other specified postprocedural states: Secondary | ICD-10-CM | POA: Diagnosis not present

## 2024-06-01 DIAGNOSIS — I480 Paroxysmal atrial fibrillation: Secondary | ICD-10-CM | POA: Diagnosis not present

## 2024-07-12 DIAGNOSIS — Z23 Encounter for immunization: Secondary | ICD-10-CM | POA: Diagnosis not present
